# Patient Record
Sex: Female | Born: 1955 | Race: Black or African American | Hispanic: No | Smoking: Current every day smoker
Health system: Southern US, Community
[De-identification: ages and names within clinical notes are randomized; demographics above are authoritative.]

## PROBLEM LIST (undated history)

## (undated) DIAGNOSIS — Z98811 Dental restoration status: Secondary | ICD-10-CM

## (undated) DIAGNOSIS — T884XXA Failed or difficult intubation, initial encounter: Secondary | ICD-10-CM

## (undated) DIAGNOSIS — Z972 Presence of dental prosthetic device (complete) (partial): Secondary | ICD-10-CM

## (undated) DIAGNOSIS — F419 Anxiety disorder, unspecified: Secondary | ICD-10-CM

## (undated) DIAGNOSIS — M549 Dorsalgia, unspecified: Secondary | ICD-10-CM

## (undated) DIAGNOSIS — R51 Headache: Secondary | ICD-10-CM

## (undated) DIAGNOSIS — F410 Panic disorder [episodic paroxysmal anxiety] without agoraphobia: Secondary | ICD-10-CM

## (undated) DIAGNOSIS — F32A Depression, unspecified: Secondary | ICD-10-CM

## (undated) DIAGNOSIS — K219 Gastro-esophageal reflux disease without esophagitis: Secondary | ICD-10-CM

## (undated) DIAGNOSIS — G4730 Sleep apnea, unspecified: Secondary | ICD-10-CM

## (undated) DIAGNOSIS — R011 Cardiac murmur, unspecified: Secondary | ICD-10-CM

## (undated) DIAGNOSIS — F329 Major depressive disorder, single episode, unspecified: Secondary | ICD-10-CM

## (undated) DIAGNOSIS — R0602 Shortness of breath: Secondary | ICD-10-CM

## (undated) DIAGNOSIS — R42 Dizziness and giddiness: Secondary | ICD-10-CM

## (undated) DIAGNOSIS — Z974 Presence of external hearing-aid: Secondary | ICD-10-CM

## (undated) DIAGNOSIS — M542 Cervicalgia: Secondary | ICD-10-CM

## (undated) DIAGNOSIS — I10 Essential (primary) hypertension: Secondary | ICD-10-CM

## (undated) HISTORY — PX: PARTIAL HYSTERECTOMY: SHX80

## (undated) HISTORY — DX: Dorsalgia, unspecified: M54.9

## (undated) HISTORY — PX: ABDOMINAL HYSTERECTOMY: SHX81

---
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
## 2010-01-29 ENCOUNTER — Emergency Department: Payer: Self-pay | Admitting: Emergency Medicine

## 2010-04-24 ENCOUNTER — Ambulatory Visit: Payer: Self-pay | Admitting: Family Medicine

## 2010-06-04 ENCOUNTER — Ambulatory Visit: Payer: Self-pay | Admitting: Family Medicine

## 2010-07-14 ENCOUNTER — Ambulatory Visit: Payer: Self-pay | Admitting: Emergency Medicine

## 2010-10-06 ENCOUNTER — Ambulatory Visit: Payer: Self-pay | Admitting: Psychology

## 2011-01-13 ENCOUNTER — Ambulatory Visit: Payer: Self-pay | Admitting: Family Medicine

## 2011-01-24 ENCOUNTER — Ambulatory Visit: Payer: Self-pay | Admitting: Orthopedic Surgery

## 2011-01-28 ENCOUNTER — Ambulatory Visit: Payer: Self-pay | Admitting: Family Medicine

## 2011-02-04 ENCOUNTER — Ambulatory Visit: Payer: Self-pay | Admitting: Family Medicine

## 2011-05-10 ENCOUNTER — Other Ambulatory Visit (HOSPITAL_COMMUNITY): Payer: Self-pay

## 2011-06-18 ENCOUNTER — Encounter (HOSPITAL_COMMUNITY): Payer: Self-pay | Admitting: Pharmacy Technician

## 2011-06-28 ENCOUNTER — Encounter (HOSPITAL_COMMUNITY): Payer: Self-pay

## 2011-06-28 ENCOUNTER — Encounter (HOSPITAL_COMMUNITY)
Admission: RE | Admit: 2011-06-28 | Discharge: 2011-06-28 | Disposition: A | Discharge status: Home / Self Care | Payer: Medicare Other | Source: Ambulatory Visit | Attending: Neurosurgery | Admitting: Neurosurgery

## 2011-06-28 ENCOUNTER — Inpatient Hospital Stay (HOSPITAL_COMMUNITY): Admission: RE | Admit: 2011-06-28 | Payer: PRIVATE HEALTH INSURANCE | Source: Ambulatory Visit

## 2011-06-28 ENCOUNTER — Other Ambulatory Visit: Payer: Self-pay

## 2011-06-28 ENCOUNTER — Encounter (HOSPITAL_COMMUNITY)
Admission: RE | Admit: 2011-06-28 | Discharge: 2011-06-28 | Disposition: A | Discharge status: Home / Self Care | Payer: Medicare Other | Source: Ambulatory Visit | Attending: Anesthesiology | Admitting: Anesthesiology

## 2011-06-28 HISTORY — DX: Shortness of breath: R06.02

## 2011-06-28 HISTORY — DX: Sleep apnea, unspecified: G47.30

## 2011-06-28 HISTORY — DX: Headache: R51

## 2011-06-28 HISTORY — DX: Anxiety disorder, unspecified: F41.9

## 2011-06-28 HISTORY — DX: Depression, unspecified: F32.A

## 2011-06-28 HISTORY — DX: Major depressive disorder, single episode, unspecified: F32.9

## 2011-06-28 HISTORY — DX: Panic disorder (episodic paroxysmal anxiety): F41.0

## 2011-06-28 HISTORY — DX: Dizziness and giddiness: R42

## 2011-06-28 LAB — DIFFERENTIAL
Basophils Absolute: 0 10*3/uL (ref 0.0–0.1)
Basophils Relative: 0 % (ref 0–1)
Eosinophils Absolute: 0.1 10*3/uL (ref 0.0–0.7)
Eosinophils Relative: 1 % (ref 0–5)
Lymphocytes Relative: 31 % (ref 12–46)
Lymphs Abs: 3.1 10*3/uL (ref 0.7–4.0)
Monocytes Absolute: 0.4 10*3/uL (ref 0.1–1.0)
Monocytes Relative: 5 % (ref 3–12)
Neutro Abs: 6.3 10*3/uL (ref 1.7–7.7)
Neutrophils Relative %: 63 % (ref 43–77)

## 2011-06-28 LAB — CBC
HCT: 41.8 % (ref 36.0–46.0)
Hemoglobin: 14.3 g/dL (ref 12.0–15.0)
MCH: 30.2 pg (ref 26.0–34.0)
MCHC: 34.2 g/dL (ref 30.0–36.0)
MCV: 88.4 fL (ref 78.0–100.0)
Platelets: 230 10*3/uL (ref 150–400)
RBC: 4.73 MIL/uL (ref 3.87–5.11)
RDW: 14.4 % (ref 11.5–15.5)
WBC: 9.9 10*3/uL (ref 4.0–10.5)

## 2011-06-28 LAB — SURGICAL PCR SCREEN
MRSA, PCR: NEGATIVE
Staphylococcus aureus: NEGATIVE

## 2011-06-28 NOTE — Progress Notes (Signed)
Left message with Erie Noe at Dr. Lindalou Hose office notifying him of potential blood refusal and pt's concern regarding consent.

## 2011-06-28 NOTE — Progress Notes (Signed)
During PAT visit pt reports that she is thought that she was only having C 5,6,7 operated on. Pt also had additional questions that she needed answered before she felt comfortable signing consent form. I informed pt that she could talk to Dr. Jordan Likes that morning. Pt reports that she is a new Jehov[employer] witness and is unsure if she wants to refuse blood or not. Pt initially stated she would give permission but was unsure as to what she felt was right. I told pt she could let us know the day of surgery if she want to consent or refuse blood and that we would ensure that she was cared for either way and honor her wishes either way.

## 2011-06-28 NOTE — Pre-Procedure Instructions (Signed)
20 Janice Wilson  06/28/2011   Your procedure is scheduled on:  July 05, 2011  Report to [facility] at 0530 AM.  Call this number if you have problems the morning of surgery: [phone]   Remember:   Do not eat food:After Midnight.  May have clear liquids: up to 4 Hours before arrival.  Clear liquids include soda, tea, black coffee, apple or grape juice, broth.  Take these medicines the morning of surgery with A SIP OF WATER:  Tylenol, albuterol, flonase, keflex, ativan, eye drops   Do not wear jewelry, make-up or nail polish.  Do not wear lotions, powders, or perfumes. You may wear deodorant.  Do not shave 48 hours prior to surgery.  Do not bring valuables to the hospital.  Contacts, dentures or bridgework may not be worn into surgery.  Leave suitcase in the car. After surgery it may be brought to your room.  For patients admitted to the hospital, checkout time is 11:00 AM the day of discharge.   Patients discharged the day of surgery will not be allowed to drive home.  Special Instructions: CHG Shower Use Special Wash: 1/2 bottle night before surgery and 1/2 bottle morning of surgery.   Please read over the following fact sheets that you were given: Pain Booklet, Coughing and Deep Breathing, Blood Transfusion Information and Surgical Site Infection Prevention

## 2011-07-01 ENCOUNTER — Other Ambulatory Visit (HOSPITAL_COMMUNITY): Payer: PRIVATE HEALTH INSURANCE

## 2011-07-04 MED ORDER — CHLORHEXIDINE GLUCONATE 4 % EX LIQD: 1.0000 "application " | Freq: Once | CUTANEOUS | Status: DC

## 2011-07-04 MED ORDER — CEFAZOLIN SODIUM 1-5 GM-% IV SOLN
1.0000 g | INTRAVENOUS | Status: AC
Start: 2011-07-05 — End: 2011-07-05
  Administered 2011-07-05: 1 g via INTRAVENOUS
  Filled 2011-07-04: qty 50

## 2011-07-04 MED ORDER — CEFAZOLIN SODIUM 1-5 GM-% IV SOLN: 1.0000 g | INTRAVENOUS | Status: DC

## 2011-07-04 MED ORDER — DEXAMETHASONE SODIUM PHOSPHATE 10 MG/ML IJ SOLN
10.0000 mg | Freq: Once | INTRAMUSCULAR | Status: DC
  Filled 2011-07-04: qty 1

## 2011-07-05 ENCOUNTER — Inpatient Hospital Stay (HOSPITAL_COMMUNITY): Payer: Medicare Other | Admitting: *Deleted

## 2011-07-05 ENCOUNTER — Inpatient Hospital Stay (HOSPITAL_COMMUNITY): Payer: Medicare Other

## 2011-07-05 ENCOUNTER — Inpatient Hospital Stay (HOSPITAL_COMMUNITY)
Admission: RE | Admit: 2011-07-05 | Discharge: 2011-07-06 | DRG: 460 | Disposition: A | Discharge status: Home / Self Care | Payer: Medicare Other | Source: Ambulatory Visit | Attending: Neurosurgery | Admitting: Neurosurgery

## 2011-07-05 ENCOUNTER — Encounter (HOSPITAL_COMMUNITY)
Admission: RE | Disposition: A | Discharge status: Home / Self Care | Payer: Self-pay | Source: Ambulatory Visit | Attending: Neurosurgery

## 2011-07-05 ENCOUNTER — Encounter (HOSPITAL_COMMUNITY): Payer: Self-pay | Admitting: *Deleted

## 2011-07-05 DIAGNOSIS — F3289 Other specified depressive episodes: Secondary | ICD-10-CM | POA: Diagnosis present

## 2011-07-05 DIAGNOSIS — Z01812 Encounter for preprocedural laboratory examination: Secondary | ICD-10-CM

## 2011-07-05 DIAGNOSIS — F410 Panic disorder [episodic paroxysmal anxiety] without agoraphobia: Secondary | ICD-10-CM | POA: Diagnosis present

## 2011-07-05 DIAGNOSIS — H409 Unspecified glaucoma: Secondary | ICD-10-CM | POA: Diagnosis present

## 2011-07-05 DIAGNOSIS — Z886 Allergy status to analgesic agent status: Secondary | ICD-10-CM

## 2011-07-05 DIAGNOSIS — M4802 Spinal stenosis, cervical region: Secondary | ICD-10-CM | POA: Diagnosis present

## 2011-07-05 DIAGNOSIS — F17200 Nicotine dependence, unspecified, uncomplicated: Secondary | ICD-10-CM | POA: Diagnosis present

## 2011-07-05 DIAGNOSIS — J45909 Unspecified asthma, uncomplicated: Secondary | ICD-10-CM | POA: Diagnosis present

## 2011-07-05 DIAGNOSIS — G4730 Sleep apnea, unspecified: Secondary | ICD-10-CM | POA: Diagnosis present

## 2011-07-05 DIAGNOSIS — Z79899 Other long term (current) drug therapy: Secondary | ICD-10-CM

## 2011-07-05 DIAGNOSIS — F329 Major depressive disorder, single episode, unspecified: Secondary | ICD-10-CM | POA: Diagnosis present

## 2011-07-05 DIAGNOSIS — F411 Generalized anxiety disorder: Secondary | ICD-10-CM | POA: Diagnosis present

## 2011-07-05 DIAGNOSIS — M4712 Other spondylosis with myelopathy, cervical region: Principal | ICD-10-CM | POA: Diagnosis present

## 2011-07-05 HISTORY — PX: ANTERIOR CERVICAL DECOMP/DISCECTOMY FUSION: SHX1161

## 2011-07-05 SURGERY — ANTERIOR CERVICAL DECOMPRESSION/DISCECTOMY FUSION 3 LEVELS
Anesthesia: General | Site: Spine Cervical | Wound class: Clean

## 2011-07-05 MED ORDER — OXYCODONE-ACETAMINOPHEN 5-325 MG PO TABS: 1.0000 | ORAL_TABLET | ORAL | Status: DC | PRN

## 2011-07-05 MED ORDER — CEFAZOLIN SODIUM 1-5 GM-% IV SOLN
1.0000 g | Freq: Three times a day (TID) | INTRAVENOUS | Status: AC
  Administered 2011-07-05 – 2011-07-06 (×2): 1 g via INTRAVENOUS
  Filled 2011-07-05 (×2): qty 50

## 2011-07-05 MED ORDER — LACTATED RINGERS IV SOLN
INTRAVENOUS | Status: DC | PRN
  Administered 2011-07-05 (×2): via INTRAVENOUS

## 2011-07-05 MED ORDER — SUFENTANIL CITRATE 50 MCG/ML IV SOLN
INTRAVENOUS | Status: DC | PRN
  Administered 2011-07-05 (×3): 10 ug via INTRAVENOUS

## 2011-07-05 MED ORDER — FLUTICASONE PROPIONATE 50 MCG/ACT NA SUSP
2.0000 | Freq: Every day | NASAL | Status: DC | PRN
  Administered 2011-07-06: 2 via NASAL
  Filled 2011-07-05: qty 16

## 2011-07-05 MED ORDER — ZOLPIDEM TARTRATE 5 MG PO TABS: 5.0000 mg | ORAL_TABLET | Freq: Every evening | ORAL | Status: DC | PRN

## 2011-07-05 MED ORDER — SODIUM CHLORIDE 0.9 % IR SOLN
Status: DC | PRN
  Administered 2011-07-05: 11:00:00

## 2011-07-05 MED ORDER — THROMBIN 5000 UNITS EX SOLR
OROMUCOSAL | Status: DC | PRN
  Administered 2011-07-05: 12:00:00 via TOPICAL

## 2011-07-05 MED ORDER — ACETAMINOPHEN 650 MG RE SUPP: 650.0000 mg | RECTAL | Status: DC | PRN

## 2011-07-05 MED ORDER — TIMOLOL HEMIHYDRATE 0.5 % OP SOLN: 1.0000 [drp] | OPHTHALMIC | Status: DC

## 2011-07-05 MED ORDER — HYDROXYZINE HCL 50 MG/ML IM SOLN: 50.0000 mg | INTRAMUSCULAR | Status: DC | PRN

## 2011-07-05 MED ORDER — CYCLOBENZAPRINE HCL 10 MG PO TABS: 10.0000 mg | ORAL_TABLET | Freq: Three times a day (TID) | ORAL | Status: DC | PRN

## 2011-07-05 MED ORDER — BACITRACIN 50000 UNITS IM SOLR
INTRAMUSCULAR | Status: AC
  Filled 2011-07-05: qty 1

## 2011-07-05 MED ORDER — ONDANSETRON HCL 4 MG/2ML IJ SOLN
INTRAMUSCULAR | Status: DC | PRN
  Administered 2011-07-05: 4 mg via INTRAVENOUS

## 2011-07-05 MED ORDER — HYDROXYZINE HCL 25 MG PO TABS: 50.0000 mg | ORAL_TABLET | ORAL | Status: DC | PRN

## 2011-07-05 MED ORDER — DEXAMETHASONE SODIUM PHOSPHATE 10 MG/ML IJ SOLN
INTRAMUSCULAR | Status: DC | PRN
  Administered 2011-07-05: 10 mg via INTRAVENOUS

## 2011-07-05 MED ORDER — SULFAMETHOXAZOLE-TMP DS 800-160 MG PO TABS
1.0000 | ORAL_TABLET | Freq: Two times a day (BID) | ORAL | Status: DC
  Administered 2011-07-05: 1 via ORAL
  Filled 2011-07-05 (×3): qty 1

## 2011-07-05 MED ORDER — ALBUTEROL SULFATE HFA 108 (90 BASE) MCG/ACT IN AERS
1.0000 | INHALATION_SPRAY | Freq: Two times a day (BID) | RESPIRATORY_TRACT | Status: DC | PRN
  Filled 2011-07-05: qty 6.7

## 2011-07-05 MED ORDER — 0.9 % SODIUM CHLORIDE (POUR BTL) OPTIME
TOPICAL | Status: DC | PRN
  Administered 2011-07-05: 1000 mL

## 2011-07-05 MED ORDER — MEPERIDINE HCL 25 MG/ML IJ SOLN: 6.2500 mg | INTRAMUSCULAR | Status: DC | PRN

## 2011-07-05 MED ORDER — PROMETHAZINE HCL 25 MG/ML IJ SOLN: 6.2500 mg | INTRAMUSCULAR | Status: DC | PRN

## 2011-07-05 MED ORDER — ROCURONIUM BROMIDE 100 MG/10ML IV SOLN
INTRAVENOUS | Status: DC | PRN
  Administered 2011-07-05: 50 mg via INTRAVENOUS

## 2011-07-05 MED ORDER — THROMBIN 20000 UNITS EX KIT
PACK | CUTANEOUS | Status: DC | PRN
  Administered 2011-07-05: 20000 [IU] via TOPICAL

## 2011-07-05 MED ORDER — MIDAZOLAM HCL 5 MG/5ML IJ SOLN
INTRAMUSCULAR | Status: DC | PRN
  Administered 2011-07-05: 2 mg via INTRAVENOUS

## 2011-07-05 MED ORDER — DOCUSATE SODIUM 100 MG PO CAPS
100.0000 mg | ORAL_CAPSULE | Freq: Two times a day (BID) | ORAL | Status: DC
  Administered 2011-07-05: 100 mg via ORAL
  Filled 2011-07-05: qty 1

## 2011-07-05 MED ORDER — FENTANYL CITRATE 0.05 MG/ML IJ SOLN
25.0000 ug | INTRAMUSCULAR | Status: DC | PRN
  Administered 2011-07-05 (×3): 25 ug via INTRAVENOUS

## 2011-07-05 MED ORDER — PHENOL 1.4 % MT LIQD
1.0000 | OROMUCOSAL | Status: DC | PRN
  Administered 2011-07-05: 1 via OROMUCOSAL
  Filled 2011-07-05: qty 177

## 2011-07-05 MED ORDER — HYDROCODONE-ACETAMINOPHEN 5-325 MG PO TABS
1.0000 | ORAL_TABLET | ORAL | Status: DC | PRN
  Administered 2011-07-05: 1 via ORAL
  Administered 2011-07-06: 2 via ORAL
  Administered 2011-07-06: 1 via ORAL
  Filled 2011-07-05 (×2): qty 2
  Filled 2011-07-05: qty 1

## 2011-07-05 MED ORDER — SODIUM CHLORIDE 0.9 % IJ SOLN: 3.0000 mL | Freq: Two times a day (BID) | INTRAMUSCULAR | Status: DC

## 2011-07-05 MED ORDER — SODIUM CHLORIDE 0.9 % IJ SOLN: 3.0000 mL | INTRAMUSCULAR | Status: DC | PRN

## 2011-07-05 MED ORDER — HEMOSTATIC AGENTS (NO CHARGE) OPTIME
TOPICAL | Status: DC | PRN
  Administered 2011-07-05: 1 via TOPICAL

## 2011-07-05 MED ORDER — MECLIZINE HCL 25 MG PO TABS
25.0000 mg | ORAL_TABLET | Freq: Three times a day (TID) | ORAL | Status: DC | PRN
  Filled 2011-07-05: qty 1

## 2011-07-05 MED ORDER — MENTHOL 3 MG MT LOZG: 1.0000 | LOZENGE | OROMUCOSAL | Status: DC | PRN

## 2011-07-05 MED ORDER — FENTANYL CITRATE 0.05 MG/ML IJ SOLN
INTRAMUSCULAR | Status: AC
  Filled 2011-07-05: qty 2

## 2011-07-05 MED ORDER — PROPOFOL 10 MG/ML IV BOLUS
INTRAVENOUS | Status: DC | PRN
  Administered 2011-07-05: 200 mg via INTRAVENOUS

## 2011-07-05 MED ORDER — TIMOLOL MALEATE 0.5 % OP SOLN
1.0000 [drp] | Freq: Every day | OPHTHALMIC | Status: DC
  Filled 2011-07-05: qty 5

## 2011-07-05 MED ORDER — SODIUM CHLORIDE 0.9 % IV SOLN
INTRAVENOUS | Status: AC
  Filled 2011-07-05: qty 500

## 2011-07-05 MED ORDER — LATANOPROST 0.005 % OP SOLN
1.0000 [drp] | Freq: Every day | OPHTHALMIC | Status: DC
  Administered 2011-07-05: 1 [drp] via OPHTHALMIC
  Filled 2011-07-05: qty 2.5

## 2011-07-05 MED ORDER — FLUTICASONE-SALMETEROL 250-50 MCG/DOSE IN AEPB
1.0000 | INHALATION_SPRAY | Freq: Two times a day (BID) | RESPIRATORY_TRACT | Status: DC | PRN
  Filled 2011-07-05: qty 14

## 2011-07-05 MED ORDER — LORAZEPAM 0.5 MG PO TABS
0.5000 mg | ORAL_TABLET | Freq: Every day | ORAL | Status: DC
Start: 2011-07-06 — End: 2011-07-06

## 2011-07-05 MED ORDER — HYDROMORPHONE HCL PF 1 MG/ML IJ SOLN
0.5000 mg | INTRAMUSCULAR | Status: DC | PRN
  Administered 2011-07-05 (×2): 1 mg via INTRAVENOUS
  Filled 2011-07-05 (×2): qty 1

## 2011-07-05 MED ORDER — NEOSTIGMINE METHYLSULFATE 1 MG/ML IJ SOLN
INTRAMUSCULAR | Status: DC | PRN
  Administered 2011-07-05: 5 mg via INTRAVENOUS

## 2011-07-05 MED ORDER — LACTATED RINGERS IV SOLN: INTRAVENOUS | Status: DC

## 2011-07-05 MED ORDER — SENNA 8.6 MG PO TABS
1.0000 | ORAL_TABLET | Freq: Two times a day (BID) | ORAL | Status: DC
  Administered 2011-07-05: 8.6 mg via ORAL
  Filled 2011-07-05 (×3): qty 1

## 2011-07-05 MED ORDER — GLYCOPYRROLATE 0.2 MG/ML IJ SOLN
INTRAMUSCULAR | Status: DC | PRN
  Administered 2011-07-05: .6 mg via INTRAVENOUS

## 2011-07-05 MED ORDER — CEPHALEXIN 500 MG PO CAPS
500.0000 mg | ORAL_CAPSULE | Freq: Two times a day (BID) | ORAL | Status: DC
  Filled 2011-07-05: qty 1

## 2011-07-05 MED ORDER — ACETAMINOPHEN 325 MG PO TABS: 650.0000 mg | ORAL_TABLET | ORAL | Status: DC | PRN

## 2011-07-05 SURGICAL SUPPLY — 57 items
BAG DECANTER FOR FLEXI CONT (MISCELLANEOUS) ×2 IMPLANT
BENZOIN TINCTURE PRP APPL 2/3 (GAUZE/BANDAGES/DRESSINGS) IMPLANT
BRUSH SCRUB EZ PLAIN DRY (MISCELLANEOUS) ×2 IMPLANT
BUR MATCHSTICK NEURO 3.0 LAGG (BURR) ×2 IMPLANT
CANISTER SUCTION 2500CC (MISCELLANEOUS) ×2 IMPLANT
CLOTH BEACON ORANGE TIMEOUT ST (SAFETY) ×2 IMPLANT
CONT SPEC 4OZ CLIKSEAL STRL BL (MISCELLANEOUS) ×2 IMPLANT
DRAPE C-ARM 42X72 X-RAY (DRAPES) ×4 IMPLANT
DRAPE LAPAROTOMY 100X72 PEDS (DRAPES) ×2 IMPLANT
DRAPE MICROSCOPE ZEISS OPMI (DRAPES) ×2 IMPLANT
DRAPE POUCH INSTRU U-SHP 10X18 (DRAPES) ×2 IMPLANT
DRILL BIT (BIT) ×2 IMPLANT
ELECT COATED BLADE 2.86 ST (ELECTRODE) ×2 IMPLANT
ELECT REM PT RETURN 9FT ADLT (ELECTROSURGICAL) ×2
ELECTRODE REM PT RTRN 9FT ADLT (ELECTROSURGICAL) ×1 IMPLANT
GAUZE SPONGE 4X4 16PLY XRAY LF (GAUZE/BANDAGES/DRESSINGS) IMPLANT
GLOVE BIOGEL PI IND STRL 7.5 (GLOVE) ×1 IMPLANT
GLOVE BIOGEL PI IND STRL 8 (GLOVE) ×1 IMPLANT
GLOVE BIOGEL PI INDICATOR 7.5 (GLOVE) ×1
GLOVE BIOGEL PI INDICATOR 8 (GLOVE) ×1
GLOVE ECLIPSE 7.5 STRL STRAW (GLOVE) ×4 IMPLANT
GLOVE ECLIPSE 8.5 STRL (GLOVE) ×2 IMPLANT
GLOVE EXAM NITRILE LRG STRL (GLOVE) IMPLANT
GLOVE EXAM NITRILE MD LF STRL (GLOVE) ×2 IMPLANT
GLOVE EXAM NITRILE XL STR (GLOVE) IMPLANT
GLOVE EXAM NITRILE XS STR PU (GLOVE) IMPLANT
GLOVE INDICATOR 8.5 STRL (GLOVE) ×2 IMPLANT
GLOVE SURG SS PI 7.0 STRL IVOR (GLOVE) ×4 IMPLANT
GOWN BRE IMP SLV AUR LG STRL (GOWN DISPOSABLE) IMPLANT
GOWN BRE IMP SLV AUR XL STRL (GOWN DISPOSABLE) ×10 IMPLANT
GOWN STRL REIN 2XL LVL4 (GOWN DISPOSABLE) IMPLANT
HEAD HALTER (SOFTGOODS) ×2 IMPLANT
HEMOSTAT POWDER SURGIFOAM 1G (HEMOSTASIS) ×2 IMPLANT
KIT BASIN OR (CUSTOM PROCEDURE TRAY) ×2 IMPLANT
KIT ROOM TURNOVER OR (KITS) ×2 IMPLANT
NEEDLE SPNL 20GX3.5 QUINCKE YW (NEEDLE) ×2 IMPLANT
NS IRRIG 1000ML POUR BTL (IV SOLUTION) ×2 IMPLANT
PACK LAMINECTOMY NEURO (CUSTOM PROCEDURE TRAY) ×2 IMPLANT
PAD ARMBOARD 7.5X6 YLW CONV (MISCELLANEOUS) ×6 IMPLANT
PLATE 3 55XLCK NS SPNE CVD (Plate) ×1 IMPLANT
PLATE 3 ATLANTIS TRANS (Plate) ×1 IMPLANT
RUBBERBAND STERILE (MISCELLANEOUS) ×4 IMPLANT
SCREW ST FIX 4 ATL 3120213 (Screw) ×16 IMPLANT
SPACER BONE CORNERSTONE 5X14 (Orthopedic Implant) ×2 IMPLANT
SPACER BONE CORNERSTONE 6X14 (Orthopedic Implant) ×4 IMPLANT
SPONGE GAUZE 4X4 12PLY (GAUZE/BANDAGES/DRESSINGS) IMPLANT
SPONGE INTESTINAL PEANUT (DISPOSABLE) ×2 IMPLANT
SPONGE SURGIFOAM ABS GEL 100 (HEMOSTASIS) ×2 IMPLANT
STRIP CLOSURE SKIN 1/2X4 (GAUZE/BANDAGES/DRESSINGS) IMPLANT
SUT PDS AB 5-0 P3 18 (SUTURE) ×2 IMPLANT
SUT VIC AB 3-0 SH 8-18 (SUTURE) ×2 IMPLANT
SYR 20ML ECCENTRIC (SYRINGE) ×2 IMPLANT
TAPE CLOTH 4X10 WHT NS (GAUZE/BANDAGES/DRESSINGS) IMPLANT
TAPE CLOTH SURG 4X10 WHT LF (GAUZE/BANDAGES/DRESSINGS) ×2 IMPLANT
TOWEL OR 17X24 6PK STRL BLUE (TOWEL DISPOSABLE) ×2 IMPLANT
TOWEL OR 17X26 10 PK STRL BLUE (TOWEL DISPOSABLE) ×2 IMPLANT
WATER STERILE IRR 1000ML POUR (IV SOLUTION) ×2 IMPLANT

## 2011-07-05 NOTE — Anesthesia Postprocedure Evaluation (Signed)
  Anesthesia Post-op Note  Patient: Janice Wilson  Procedure(s) Performed:  ANTERIOR CERVICAL DECOMPRESSION/DISCECTOMY FUSION 3 LEVELS - Cervical Four-Five, Cervical Five-Six, Cervical Six-Seven Anterior Cervical Decompression Fusion WITH ALLOGRAFT AND PLATING  Patient Location: PACU  Anesthesia Type: General  Level of Consciousness: awake  Airway and Oxygen Therapy: Patient Spontanous Breathing  Post-op Pain: mild  Post-op Assessment: Post-op Vital signs reviewed  Post-op Vital Signs: stable  Complications: No apparent anesthesia complications

## 2011-07-05 NOTE — Op Note (Signed)
Date of procedure: 07/05/2011  Date of dictation: Same  Service: Neurosurgery  Preoperative diagnosis: Cervical stenosis with myelopathy.  Postoperative diagnosis: Same  Procedure Name: C4-5, C5-6, C6-7 anterior cervical discectomy and fusion with allograft and anterior plating.  Surgeon:Jessee Mezera A.Georg Ang, M.D.  Asst. Surgeon: Wynetta Emery  Anesthesia: General  Indication: Patient is a 56 year old female with neck pain and bilateral upper chamois numbness paresthesias and weakness consistent with an early cervical myelopathy. Workup demonstrates evidence of marked multilevel stenosis worse at the C5-6 level. Patient presents now for three-level anterior decompression infusion in hopes of improving her symptoms.  Operative note: Patient taken to the operative room and placed on the operative table in the supine position. After an adequate level of anesthesia is achieved, the patient positioned supine with her neck slightly extended and held in place with halter traction. Patient's anterior cervical region was prepped and draped sterilely. 10 blade used to make a linear skin incision overlying the C5-6 vertebral level. This is carried down sharply to the platysma. This was then divided vertically and dissection proceeded along the medial border of the sternocleidomastoid muscle and carotid sheath. Trachea and esophagus were mobilized and retracted towards the left. Prevertebral fascia was stripped off the anterior spinal column. Longus colli muscles and elevated bilaterally using a left heart. Deep self retaining retractors placed intraoperative fluoroscopy is used and the levels were confirmed. The spaces at all 3 levels and incised with 15 blade in a rectangular fashion. A wide disc space cleanout was then performed using pituitary rongeurs Kerrison rongeurs forward and backward angle Carlen curettes and the high-speed drill. All meds of the disc were removed down to the level of the posterior annulus.  Microscope was then brought field used throughout the remainder of the discectomy. Remaining aspects of annulus and osteophytes removed using a high-speed drill. Posterior lateral is elevated and resected in piecemeal fashion. Starting first at C4-5 the bodies of C4 and C5 were then undercut using Kerrison rongeurs to fully decompress the central canal. Decompression then proceeded to C8 out each neural foramen. Wide anterior foraminotomies were then form of course exiting C5 nerve roots bilaterally. At this point a very thorough decompression achieved. There is no his injury to thecal sac and nerve roots. Wounds that area irrigated. Discectomies were then performed at C5-6 and C6-7 in a similar fashion again without complication. Gelfoam was placed topically for hemostasis. Cornerstone allograft wedges at were then impacted in place and recessed roughly 1 mm from the intervertebral margin at C4-5, C5-6, and C6-7. A 55 mm Atlantis translational plate was then placed over the C4-C7 levels. This is an attachment or fluoroscopic guidance using 13 mm fixed angle screws 2 each all levels. All 8 screws ago final tightening down to be solidly within bone. Locking screws were gauge at all 4 levels. Final images revealed good position the bone graft and hardware proper upper level with normal lamina spine. Wound is then irrigated one final time. Hemostasis was assured with bipolar cautery. Wounds and closed in a typical fashion. Steri-Strips triggers were applied. There no pertinent gauge progression well and she returns to the recovery postop.

## 2011-07-05 NOTE — Anesthesia Preprocedure Evaluation (Addendum)
Anesthesia Evaluation  Patient identified by MRN, date of birth, ID band Patient awake    Reviewed: Allergy & Precautions, H&P , NPO status , Patient's Chart, lab work & pertinent test results, reviewed documented beta blocker date and time   Airway Mallampati: II TM Distance: >3 FB Neck ROM: Full    Dental  (+) Teeth Intact and Dental Advisory Given   Pulmonary shortness of breath and with exertion, asthma , sleep apnea and Continuous Positive Airway Pressure Ventilation , Current Smoker,  clear to auscultation        Cardiovascular neg cardio ROS Regular Normal    Neuro/Psych    GI/Hepatic negative GI ROS,   Endo/Other  Negative Endocrine ROS  Renal/GU negative Renal ROS     Musculoskeletal   Abdominal   Peds  Hematology  (+) JEHOVAH'S WITNESSPatient resigned refusal for blood paper work and aborted it. In a life emergency patient would accept RBCs/blood transfusion. CRNA RM present in discussion with patient as well as Dr. Jordan Likes awareness of discussion. CE   Anesthesia Other Findings   Reproductive/Obstetrics                        Anesthesia Physical Anesthesia Plan  ASA: III  Anesthesia Plan: General   Post-op Pain Management:    Induction: Intravenous  Airway Management Planned: Oral ETT  Additional Equipment:   Intra-op Plan:   Post-operative Plan: Extubation in OR  Informed Consent:   Plan Discussed with: CRNA  Anesthesia Plan Comments:         Anesthesia Quick Evaluation

## 2011-07-05 NOTE — Plan of Care (Signed)
Problem: Consults Goal: Diagnosis - Spinal Surgery Outcome: Completed/Met Date Met:  07/05/11 Cervical Spine Fusion

## 2011-07-05 NOTE — Progress Notes (Signed)
2305 :RT NOTE;Pt setup on CPAP via A-flex mode(4-20cmH20),with nasal mask for hs.

## 2011-07-05 NOTE — Transfer of Care (Signed)
Immediate Anesthesia Transfer of Care Note  Patient: Janice Wilson  Procedure(s) Performed:  ANTERIOR CERVICAL DECOMPRESSION/DISCECTOMY FUSION 3 LEVELS - Cervical Four-Five, Cervical Five-Six, Cervical Six-Seven Anterior Cervical Decompression Fusion WITH ALLOGRAFT AND PLATING  Patient Location: PACU  Anesthesia Type: General  Level of Consciousness: awake and alert   Airway & Oxygen Therapy: Patient Spontanous Breathing and Patient connected to face mask oxygen  Post-op Assessment: Report given to PACU RN, Post -op Vital signs reviewed and stable and Patient moving all extremities  Post vital signs: Reviewed and stable Filed Vitals:   07/05/11 0841  BP: 137/86  Pulse: 53  Temp: 36.8 C  Resp: 18    Complications: No apparent anesthesia complications

## 2011-07-05 NOTE — Brief Op Note (Signed)
07/05/2011  12:26 PM  PATIENT:  Janice Wilson  56 y.o. female  PRE-OPERATIVE DIAGNOSIS:  STENOSIS  POST-OPERATIVE DIAGNOSIS:  STENOSIS  PROCEDURE:  Procedure(s): ANTERIOR CERVICAL DECOMPRESSION/DISCECTOMY FUSION 3 LEVELS  SURGEON:  Surgeon(s): Sherilyn Cooter A Marcellina Jonsson Mariam Dollar  PHYSICIAN ASSISTANT:   ASSISTANTSWynetta Emery   ANESTHESIA:   general  EBL:  Total I/O In: 1000 [I.V.:1000] Out: -   BLOOD ADMINISTERED:none  DRAINS: none   LOCAL MEDICATIONS USED:  NONE  SPECIMEN:  No Specimen  DISPOSITION OF SPECIMEN:  N/A  COUNTS:  YES  TOURNIQUET:  * No tourniquets in log *  DICTATION: .Dragon Dictation  PLAN OF CARE: Admit to inpatient   PATIENT DISPOSITION:  PACU - hemodynamically stable.   Delay start of Pharmacological VTE agent (>24hrs) due to surgical blood loss or risk of bleeding:  {yes

## 2011-07-05 NOTE — Preoperative (Signed)
Beta Blockers   Reason not to administer Beta Blockers:Not Applicable 

## 2011-07-05 NOTE — H&P (Signed)
Janice Wilson is an 56 y.o. female.   Chief Complaint: Neck pain HPI: 56 year old woman with neck pain with radiation to both upper extremities. Workup demonstrates evidence of severe multilevel stenosis worse at C5-6 with a central disc herniation and severe spinal cord compression. Patient presents now for C4-5 C5-6 and C6-7 decompression and fusion surgery. Patient has some symptoms of mild weakness in her distal upper trimming his. She has some mild gait disturbance. She has no bowel or bladder dysfunction.  Past Medical History  Diagnosis Date  . Asthma   . Shortness of breath   . Sleep apnea     uses CPAP machine sleep study 9/ 2012 [facility] [place]  . Anxiety   . Headache   . Glaucoma   . Vertigo   . Panic attack   . Depression     Past Surgical History  Procedure Date  . Partial hysterectomy   . Cesarean section     Family History  Problem Relation Age of Onset  . Anesthesia problems Neg Hx    Social History:  reports that she has been smoking.  She has never used smokeless tobacco. She reports that she drinks alcohol. She reports that she does not use illicit drugs.  Allergies:  Allergies  Allergen Reactions  . Oxycontin Other (See Comments)    REACTION:"MAKES ME PALE"    Medications Prior to Admission  Medication Dose Route Frequency Provider Last Rate Last Dose  . bacitracin 16109 UNITS injection           . ceFAZolin (ANCEF) IVPB 1 g/50 mL premix  1 g Intravenous 60 min Pre-Op Khamryn Calderone A Mailey Landstrom      . chlorhexidine (HIBICLENS) 4 % liquid 1 application  1 application Topical Once [employer]      . chlorhexidine (HIBICLENS) 4 % liquid 1 application  1 application Topical Once [employer]      . dexamethasone (DECADRON) injection 10 mg  10 mg Intravenous Once [employer]      . fentaNYL (SUBLIMAZE) injection 25-50 mcg  25-50 mcg Intravenous Q5 min PRN Judie Petit, MD      . lactated ringers infusion   Intravenous Continuous Judie Petit,  MD      . meperidine (DEMEROL) injection 6.25-12.5 mg  6.25-12.5 mg Intravenous Q5 min PRN Judie Petit, MD      . promethazine (PHENERGAN) injection 6.25-12.5 mg  6.25-12.5 mg Intravenous Q15 min PRN Judie Petit, MD      . sodium chloride 0.9 % infusion           . DISCONTD: ceFAZolin (ANCEF) IVPB 1 g/50 mL premix  1 g Intravenous 60 min Pre-Op Kamalei Roeder A Lakeem Rozo       Medications Prior to Admission  Medication Sig Dispense Refill  . fluticasone (FLONASE) 50 MCG/ACT nasal spray Place 2 sprays into the nose daily as needed. FOR CONGESTION       . Fluticasone-Salmeterol (ADVAIR) 250-50 MCG/DOSE AEPB Inhale 1 puff into the lungs every 12 (twelve) hours as needed. FOR WHEEZING       . ibuprofen (ADVIL,MOTRIN) 800 MG tablet Take 800 mg by mouth daily as needed. FOR PAIN       . LORazepam (ATIVAN) 0.5 MG tablet Take 0.5 mg by mouth daily.          No results found for this or any previous visit (from the past 48 hour(s)). No results found.  Review of Systems  All  other systems reviewed and are negative.    Blood pressure 137/86, pulse 53, temperature 98.3 F (36.8 C), temperature source Oral, resp. rate 18, SpO2 97.00%. Physical Exam  Nursing note and vitals reviewed. Constitutional: She is oriented to person, place, and time. She appears well-developed and well-nourished. No distress.  HENT:  Head: Normocephalic and atraumatic.  Eyes: Pupils are equal, round, and reactive to light. Right eye exhibits no discharge. Left eye exhibits no discharge.  Neck: Normal range of motion. Neck supple. No tracheal deviation present. No thyromegaly present.  Cardiovascular: Normal rate, regular rhythm, normal heart sounds and intact distal pulses.   Respiratory: Effort normal and breath sounds normal. No respiratory distress. She has no wheezes.  GI: Soft. Bowel sounds are normal. She exhibits no distension. There is no tenderness.  Musculoskeletal: Normal range of motion. She exhibits no edema and  no tenderness.  Neurological: She is alert and oriented to person, place, and time. She has normal reflexes. She displays normal reflexes. No cranial nerve deficit. Coordination normal.  Skin: Skin is warm and dry. No rash noted. She is not diaphoretic. No erythema. No pallor.  Psychiatric: She has a normal mood and affect. Her behavior is normal. Judgment and thought content normal.     Assessment/Plan Cervical stenosis with myelopathy. Plan to C4-5 C5-6 and C6-7 anterior cervical discectomy and fusion with allograft and anterior plating. Risks and benefits explained patient wishes to proceed.  Kagan Hietpas A 07/05/2011, 10:01 AM

## 2011-07-06 ENCOUNTER — Encounter (HOSPITAL_COMMUNITY): Payer: Self-pay | Admitting: Neurosurgery

## 2011-07-06 MED ORDER — CYCLOBENZAPRINE HCL 10 MG PO TABS: 10.0000 mg | ORAL_TABLET | Freq: Three times a day (TID) | ORAL | Status: AC | PRN

## 2011-07-06 MED ORDER — HYDROCODONE-ACETAMINOPHEN 5-325 MG PO TABS: 1.0000 | ORAL_TABLET | ORAL | Status: AC | PRN

## 2011-07-06 NOTE — Progress Notes (Signed)
UR COMPLETED  

## 2011-07-06 NOTE — Discharge Summary (Signed)
Physician Discharge Summary  Patient ID: Janice Wilson MRN: [id number] DOB/AGE: 1956-02-24 56 y.o.  Admit date: 07/05/2011 Discharge date: 07/06/2011  Admission Diagnoses:  Discharge Diagnoses:  Principal Problem:  *Cervical spinal stenosis   Discharged Condition: good  Hospital Course: Admitted to the hospital where she underwent an uncomplicated three-level anterior cervical discectomy and fusion. Postoperative she is done well. She had resolution of her preoperative neck and upper cavity symptoms. Her her sensation are intact and she feels ready for discharge home.  Consults: none  Significant Diagnostic Studies: None  Treatments: surgery: C4-5 C5-6 C6-7 anterior cervical discectomy and fusion with allograft and anterior plating.  Discharge Exam: Blood pressure 151/88, pulse 67, temperature 99 F (37.2 C), temperature source Oral, resp. rate 16, SpO2 95.00%. Awake and alert oriented appropriate  Nerve function is intact. Motor and sensory function extremities is normal. Wound healing well. Chest and abdomen exam benign.  Disposition: Final discharge disposition not confirmed   Medication List  As of 07/06/2011 10:56 AM   TAKE these medications         acetaminophen 325 MG tablet   Commonly known as: TYLENOL   Take 325 mg by mouth every 6 (six) hours as needed. For pain      albuterol 108 (90 BASE) MCG/ACT inhaler   Commonly known as: PROVENTIL HFA;VENTOLIN HFA   Inhale 1 puff into the lungs 2 (two) times daily as needed. For shortness of breath      cephALEXin 500 MG capsule   Commonly known as: KEFLEX   Take 500 mg by mouth 2 (two) times daily.      cyclobenzaprine 10 MG tablet   Commonly known as: FLEXERIL   Take 1 tablet (10 mg total) by mouth 3 (three) times daily as needed for muscle spasms.      fluticasone 50 MCG/ACT nasal spray   Commonly known as: FLONASE   Place 2 sprays into the nose daily as needed. FOR CONGESTION      Fluticasone-Salmeterol  250-50 MCG/DOSE Aepb   Commonly known as: ADVAIR   Inhale 1 puff into the lungs every 12 (twelve) hours as needed. FOR WHEEZING      HYDROcodone-acetaminophen 5-325 MG per tablet   Commonly known as: NORCO   Take 1-2 tablets by mouth every 4 (four) hours as needed.      ibuprofen 800 MG tablet   Commonly known as: ADVIL,MOTRIN   Take 800 mg by mouth daily as needed. FOR PAIN      LORazepam 0.5 MG tablet   Commonly known as: ATIVAN   Take 0.5 mg by mouth daily.      meclizine 25 MG tablet   Commonly known as: ANTIVERT   Take 25 mg by mouth 3 (three) times daily as needed. For vertigo        sulfamethoxazole-trimethoprim 800-160 MG per tablet   Commonly known as: BACTRIM DS   Take 1 tablet by mouth 2 (two) times daily.      timolol 0.5 % ophthalmic solution   Commonly known as: BETIMOL   Place 1 drop into both eyes every morning.      XALATAN 0.005 % ophthalmic solution   Generic drug: latanoprost   Place 1 drop into both eyes at bedtime.           Follow-up Information    Follow up with Phillip Sandler Wilson. Call in 1 week.   Contact information:   [address] [place] [address] [phone]  Signed: Edelmira Gallogly Wilson 07/06/2011, 10:56 AM

## 2011-08-13 ENCOUNTER — Ambulatory Visit: Payer: Self-pay | Admitting: Family Medicine

## 2011-08-25 ENCOUNTER — Ambulatory Visit
Admission: RE | Admit: 2011-08-25 | Discharge: 2011-08-25 | Disposition: A | Discharge status: Home / Self Care | Payer: Medicare Other | Source: Ambulatory Visit | Attending: Neurosurgery | Admitting: Neurosurgery

## 2011-08-25 ENCOUNTER — Other Ambulatory Visit: Payer: Self-pay | Admitting: Neurosurgery

## 2011-08-25 DIAGNOSIS — M4802 Spinal stenosis, cervical region: Secondary | ICD-10-CM

## 2011-10-13 ENCOUNTER — Other Ambulatory Visit: Payer: Self-pay | Admitting: Neurosurgery

## 2011-10-13 DIAGNOSIS — M4802 Spinal stenosis, cervical region: Secondary | ICD-10-CM

## 2011-10-21 ENCOUNTER — Ambulatory Visit
Admission: RE | Admit: 2011-10-21 | Discharge: 2011-10-21 | Disposition: A | Discharge status: Home / Self Care | Payer: Medicare Other | Source: Ambulatory Visit | Attending: Neurosurgery | Admitting: Neurosurgery

## 2011-10-21 DIAGNOSIS — M4802 Spinal stenosis, cervical region: Secondary | ICD-10-CM

## 2011-11-11 ENCOUNTER — Emergency Department: Payer: Self-pay | Admitting: *Deleted

## 2012-01-12 ENCOUNTER — Other Ambulatory Visit: Payer: Self-pay | Admitting: Neurosurgery

## 2012-01-12 DIAGNOSIS — M545 Low back pain, unspecified: Secondary | ICD-10-CM

## 2012-01-21 ENCOUNTER — Ambulatory Visit
Admission: RE | Admit: 2012-01-21 | Discharge: 2012-01-21 | Disposition: A | Discharge status: Home / Self Care | Payer: Medicare Other | Source: Ambulatory Visit | Attending: Neurosurgery | Admitting: Neurosurgery

## 2012-01-21 DIAGNOSIS — M545 Low back pain, unspecified: Secondary | ICD-10-CM

## 2012-04-03 ENCOUNTER — Ambulatory Visit: Payer: Self-pay

## 2012-05-31 ENCOUNTER — Ambulatory Visit: Payer: Self-pay | Admitting: Unknown Physician Specialty

## 2012-08-14 ENCOUNTER — Ambulatory Visit: Payer: Self-pay | Admitting: Family Medicine

## 2012-12-04 ENCOUNTER — Ambulatory Visit: Payer: Self-pay | Admitting: Family Medicine

## 2013-01-23 ENCOUNTER — Other Ambulatory Visit: Payer: Self-pay | Admitting: Neurosurgery

## 2013-01-23 DIAGNOSIS — M4712 Other spondylosis with myelopathy, cervical region: Secondary | ICD-10-CM

## 2013-02-05 ENCOUNTER — Ambulatory Visit
Admission: RE | Admit: 2013-02-05 | Discharge: 2013-02-05 | Disposition: A | Discharge status: Home / Self Care | Payer: Medicare Other | Source: Ambulatory Visit | Attending: Neurosurgery | Admitting: Neurosurgery

## 2013-02-05 DIAGNOSIS — M4712 Other spondylosis with myelopathy, cervical region: Secondary | ICD-10-CM

## 2013-06-25 ENCOUNTER — Ambulatory Visit: Payer: Self-pay | Admitting: Family Medicine

## 2013-08-17 ENCOUNTER — Ambulatory Visit: Payer: Medicare Other | Admitting: Internal Medicine

## 2013-08-21 ENCOUNTER — Ambulatory Visit: Payer: Self-pay | Admitting: Family Medicine

## 2013-08-28 ENCOUNTER — Encounter: Payer: Medicare Other | Admitting: Internal Medicine

## 2013-08-28 NOTE — Progress Notes (Signed)
This encounter was created in error - please disregard.

## 2013-09-14 ENCOUNTER — Encounter: Payer: Self-pay | Admitting: Internal Medicine

## 2013-09-14 ENCOUNTER — Ambulatory Visit (INDEPENDENT_AMBULATORY_CARE_PROVIDER_SITE_OTHER): Payer: Commercial Managed Care - HMO | Admitting: Internal Medicine

## 2013-09-14 VITALS — BP 124/80 | HR 73 | Temp 98.2°F | Resp 12 | Ht 65.0 in | Wt 168.0 lb

## 2013-09-14 DIAGNOSIS — L659 Nonscarring hair loss, unspecified: Secondary | ICD-10-CM

## 2013-09-14 LAB — VITAMIN B12: Vitamin B-12: 1418 pg/mL — ABNORMAL HIGH (ref 211–911)

## 2013-09-14 LAB — T4, FREE: Free T4: 0.66 ng/dL (ref 0.60–1.60)

## 2013-09-14 LAB — TSH: TSH: 0.34 u[IU]/mL — ABNORMAL LOW (ref 0.35–5.50)

## 2013-09-14 LAB — T3, FREE: T3, Free: 2.9 pg/mL (ref 2.3–4.2)

## 2013-09-14 NOTE — Progress Notes (Signed)
Patient ID: Janice Wilson, female   DOB: 05/11/1956, 58 y.o.   MRN: [id number]   HPI  Janice Wilson is a 58 y.o.-year-old female, referred by her PCP, Dr. Manuella Ghazi, for evaluation for hair loss.  Pt. has been noticing hair loss in 10-04/2013. She stopped dying her hair and perms in ~05/2013. She stopped using the product Base and now changed hair salons and hair is better after stopped using the Base.  Her hair comes out in clumps every time she touched her hair. No bald spot, it comes out uniformly. She describes a lot of stress in the last 6 mo >> with her daughter and grandchild moving in, but now pt moved out of the house and stress is better.   She did not see a dermatologist yet.  Pt describes: - + some heat intolerance - + weight gain - no fatigue - + constipation - no dry skin - + anxiety and + depression  She had menopause at 58 y/o >> TAH but not BSO. She has hot flushes.   She takes Biotin and a MVI. Exercises by walking.  No FH of thyroid disorders.  We asked for records from PCP, which arrived during appt: - 02/15/2013: TSH 1.420, HbA1c 5.9%. - 06/25/2013: CBC with diff normal; CMP normal, except Glu 119 (nonfasting) Will scan the rest in Epic.  ROS: Constitutional: See HPI, + poor sleep Eyes: + blurry vision, no xerophthalmia ENT: no sore throat, no nodules palpated in throat, no dysphagia/odynophagia, no hoarseness, + tinnitus, + hypoacusis Cardiovascular: no CP/+ SOB/no palpitations/+ leg swelling Respiratory: no cough/+ SOB Gastrointestinal: no N/V/+ C, + heartburn Musculoskeletal: + muscle aches/+ joint aches Skin: no rashes, + itching, + easy bruising, + hair loss Neurological: no tremors/numbness/tingling/dizziness, + HA Psychiatric: + both: depression/anxiety Low libido  Past Medical History  Diagnosis Date  . Asthma   . Shortness of breath   . Sleep apnea     uses CPAP machine sleep study 9/ 2012 done at sleep med in [place]  . Anxiety   .  Headache(784.0)   . Glaucoma   . Vertigo   . Panic attack   . Depression    Past Surgical History  Procedure Laterality Date  . Partial hysterectomy    . Cesarean section    . Anterior cervical decomp/discectomy fusion  07/05/2011    Procedure: ANTERIOR CERVICAL DECOMPRESSION/DISCECTOMY FUSION 3 LEVELS;  Surgeon: Cooper Render Pool;  Location: [place] [facility];  Service: Neurosurgery;  Laterality: N/A;  Cervical Four-Five, Cervical Five-Six, Cervical Six-Seven Anterior Cervical Decompression Fusion WITH ALLOGRAFT AND PLATING   History   Social History  . Marital Status: Single    Spouse Name: N/A    Number of Children: 1   Occupational History  . Disabled   Social History Main Topics  . Smoking status: Former Smoker -- 0.25 packs/day for 21 years    Quit date: 02/19/2013  . Smokeless tobacco: Never Used  . Alcohol Use: Yes     Comment: 1-2 beer a month  . Drug Use: No   Current Outpatient Prescriptions on File Prior to Visit  Medication Sig Dispense Refill  . acetaminophen (TYLENOL) 325 MG tablet Take 325 mg by mouth every 6 (six) hours as needed. For pain       . albuterol (PROVENTIL HFA;VENTOLIN HFA) 108 (90 BASE) MCG/ACT inhaler Inhale 1 puff into the lungs every 4 (four) hours as needed. For shortness of breath      . fluticasone (FLONASE) 50  MCG/ACT nasal spray Place 2 sprays into the nose daily as needed. FOR CONGESTION       . Fluticasone-Salmeterol (ADVAIR) 250-50 MCG/DOSE AEPB Inhale 1 puff into the lungs every 12 (twelve) hours as needed. FOR WHEEZING       . ibuprofen (ADVIL,MOTRIN) 800 MG tablet Take 800 mg by mouth daily as needed. FOR PAIN       . latanoprost (XALATAN) 0.005 % ophthalmic solution Place 1 drop into both eyes at bedtime.        Marland Kitchen LORazepam (ATIVAN) 0.5 MG tablet Take 0.5 mg by mouth daily.        . meclizine (ANTIVERT) 25 MG tablet Take 25 mg by mouth 3 (three) times daily as needed. For vertigo        . timolol (BETIMOL) 0.5 % ophthalmic solution Place 1  drop into both eyes every morning.        . cephALEXin (KEFLEX) 500 MG capsule Take 500 mg by mouth 2 (two) times daily.        Marland Kitchen sulfamethoxazole-trimethoprim (BACTRIM DS) 800-160 MG per tablet Take 1 tablet by mouth 2 (two) times daily.         No current facility-administered medications on file prior to visit.   Allergies  Allergen Reactions  . Oxycodone Hcl Other (See Comments)    REACTION:"MAKES ME PALE"   Family History  Problem Relation Age of Onset  . Anesthesia problems Neg Hx    PE: BP 124/80  Pulse 73  Temp(Src) 98.2 F (36.8 C) (Oral)  Resp 12  Ht 5\' 5"  (1.651 m)  Wt 168 lb (76.204 kg)  BMI 27.96 kg/m2  SpO2 97% Wt Readings from Last 3 Encounters:  09/14/13 168 lb (76.204 kg)  06/28/11 159 lb 9.8 oz (72.4 kg)   Constitutional: overweight, in NAD Eyes: PERRLA, EOMI, no exophthalmos ENT: moist mucous membranes, no thyromegaly, no cervical lymphadenopathy Cardiovascular: RRR, No MRG Respiratory: CTA B Gastrointestinal: abdomen soft, NT, ND, BS+ Musculoskeletal: no deformities, strength intact in all 4 Skin: moist, warm, no rashes, hair breaking and thinning - no alopecia areata Neurological: no tremor with outstretched hands, DTR normal in all 4  ASSESSMENT: 1. Hair loss  PLAN:  We discussed about possible causes of alopecia. For her, the hair loss can be related to her hair products used. However, there are other possible causes:  Hypothyroidism - will check TFTs today  Pregnancy - she is postmenopausal  Menopause   Poor diet - she recently improved her diet: more plant-based  Stress - improved recently (see HPI)  Vitamin deficiencies - advised to take B12 vitamin and B6 vitamin supplements; is not taking excess vitamin A   Micronutrient deficiencies  - iron, zinc  Advised her to get all essential amino acids from the diet (especially L-lysine)  Medications - she does not appear to on meds that cause hair loss Will check a B12 vitamin level and  TFTs. Will schedule anew appt if thyroid labs are abnormal.   Office Visit on 09/14/2013  Component Date Value Ref Range Status  . Vitamin B-12 09/14/2013 1418* 211 - 911 pg/mL Final  . TSH 09/14/2013 0.34* 0.35 - 5.50 uIU/mL Final  . Free T4 09/14/2013 0.66  0.60 - 1.60 ng/dL Final  . T3, Free 09/14/2013 2.9  2.3 - 4.2 pg/mL Final   TSH a little low >> I would like to repeat TFTs in 2 mo. B12 a little high.

## 2013-09-14 NOTE — Patient Instructions (Signed)
Please stop at the lab. We will schedule a new appt if the labs are abnormal. 

## 2013-09-17 ENCOUNTER — Telehealth: Payer: Self-pay | Admitting: Internal Medicine

## 2013-09-17 ENCOUNTER — Encounter: Payer: Self-pay | Admitting: Internal Medicine

## 2013-09-17 NOTE — Telephone Encounter (Signed)
Pt would like her test results explained to her

## 2013-09-18 NOTE — Telephone Encounter (Signed)
No danger with a slightly higher B12. Regarding Safslim, I cannot give her a medical opinion since this is not FDA regulated, but reviewing the label, it does not appear to interfere with B12 or thyroid levels. Unlikely that the cervical surgery incision affected her thyroid levels.

## 2013-09-18 NOTE — Telephone Encounter (Signed)
Called pt per Dr Arman Filter note. Pt understood.

## 2013-09-18 NOTE — Telephone Encounter (Signed)
Returned pt's call. Pt concerned about her B-12 levels. She wants to know if there is any kind of danger with this number, 1418? Pt also stated that she had fusion surgery on her spine and they went in the front of her neck to do this surgery, she is concerned this may have affected her thyroid. Does Dr Cruzita Lederer believe this had any affect or damaged her thryoid? Also, pt states she is taking this OTC Safslim for reducing belly fat, 2 tsp daily. Pt wants to know if this could affect her thyroid or her B-12? Please advise. Thank you.

## 2013-10-08 ENCOUNTER — Emergency Department: Payer: Self-pay | Admitting: Emergency Medicine

## 2013-10-08 LAB — CBC WITH DIFFERENTIAL/PLATELET
Basophil #: 0.1 10*3/uL (ref 0.0–0.1)
Basophil %: 0.8 %
Eosinophil #: 0.1 10*3/uL (ref 0.0–0.7)
Eosinophil %: 0.8 %
HCT: 40.8 % (ref 35.0–47.0)
HGB: 13.9 g/dL (ref 12.0–16.0)
Lymphocyte #: 3 10*3/uL (ref 1.0–3.6)
Lymphocyte %: 30.2 %
MCH: 29.9 pg (ref 26.0–34.0)
MCHC: 34 g/dL (ref 32.0–36.0)
MCV: 88 fL (ref 80–100)
Monocyte #: 0.6 x10 3/mm (ref 0.2–0.9)
Monocyte %: 6.2 %
Neutrophil #: 6.2 10*3/uL (ref 1.4–6.5)
Neutrophil %: 62 %
Platelet: 235 10*3/uL (ref 150–440)
RBC: 4.65 10*6/uL (ref 3.80–5.20)
RDW: 13.7 % (ref 11.5–14.5)
WBC: 10 10*3/uL (ref 3.6–11.0)

## 2013-10-08 LAB — BASIC METABOLIC PANEL
Anion Gap: 6 — ABNORMAL LOW (ref 7–16)
BUN: 14 mg/dL (ref 7–18)
Calcium, Total: 9 mg/dL (ref 8.5–10.1)
Chloride: 106 mmol/L (ref 98–107)
Co2: 26 mmol/L (ref 21–32)
Creatinine: 0.58 mg/dL — ABNORMAL LOW (ref 0.60–1.30)
EGFR (African American): >60
EGFR (Non-African Amer.): >60
Glucose: 91 mg/dL (ref 65–99)
Osmolality: 276 (ref 275–301)
Potassium: 3.7 mmol/L (ref 3.5–5.1)
Sodium: 138 mmol/L (ref 136–145)

## 2013-11-06 ENCOUNTER — Ambulatory Visit: Payer: Self-pay | Admitting: Family Medicine

## 2013-11-13 ENCOUNTER — Ambulatory Visit: Payer: Medicare Other | Admitting: Internal Medicine

## 2013-11-16 ENCOUNTER — Ambulatory Visit: Payer: Medicare Other | Admitting: Internal Medicine

## 2013-12-27 ENCOUNTER — Ambulatory Visit: Payer: Commercial Managed Care - HMO | Admitting: Internal Medicine

## 2014-01-31 ENCOUNTER — Ambulatory Visit: Payer: Commercial Managed Care - HMO | Admitting: Internal Medicine

## 2014-04-15 ENCOUNTER — Emergency Department: Payer: Self-pay | Admitting: Emergency Medicine

## 2014-04-16 DIAGNOSIS — J4521 Mild intermittent asthma with (acute) exacerbation: Secondary | ICD-10-CM | POA: Insufficient documentation

## 2014-04-24 ENCOUNTER — Ambulatory Visit: Payer: Self-pay | Admitting: Family Medicine

## 2014-06-06 ENCOUNTER — Ambulatory Visit: Payer: Self-pay | Admitting: Neurosurgery

## 2014-07-31 DIAGNOSIS — N8110 Cystocele, unspecified: Secondary | ICD-10-CM | POA: Insufficient documentation

## 2014-08-30 ENCOUNTER — Ambulatory Visit: Payer: Self-pay | Admitting: Family Medicine

## 2014-09-11 ENCOUNTER — Ambulatory Visit: Payer: Self-pay | Admitting: Neurosurgery

## 2014-12-16 ENCOUNTER — Telehealth: Payer: Self-pay | Admitting: Family Medicine

## 2014-12-16 NOTE — Telephone Encounter (Signed)
Pt needs refill on Lorazepam and she uses the [facility] in [place]. Pt has appt sched 01/01/15 with Manuella Ghazi.

## 2014-12-17 ENCOUNTER — Other Ambulatory Visit: Payer: Self-pay | Admitting: Family Medicine

## 2014-12-17 MED ORDER — LORAZEPAM 0.5 MG PO TABS: 0.5000 mg | ORAL_TABLET | Freq: Every day | ORAL | Status: DC

## 2014-12-18 NOTE — Telephone Encounter (Signed)
Hard script placed at the front desk.

## 2015-01-01 ENCOUNTER — Ambulatory Visit: Payer: Self-pay | Admitting: Family Medicine

## 2015-01-02 ENCOUNTER — Ambulatory Visit: Payer: Self-pay | Admitting: Family Medicine

## 2015-01-02 ENCOUNTER — Encounter: Payer: Self-pay | Admitting: Family Medicine

## 2015-01-02 ENCOUNTER — Ambulatory Visit (INDEPENDENT_AMBULATORY_CARE_PROVIDER_SITE_OTHER): Payer: PPO | Admitting: Family Medicine

## 2015-01-02 VITALS — BP 133/73 | HR 63 | Temp 98.4°F | Ht 65.0 in | Wt 166.4 lb

## 2015-01-02 DIAGNOSIS — F418 Other specified anxiety disorders: Secondary | ICD-10-CM | POA: Diagnosis not present

## 2015-01-02 DIAGNOSIS — F419 Anxiety disorder, unspecified: Secondary | ICD-10-CM

## 2015-01-02 DIAGNOSIS — G8929 Other chronic pain: Secondary | ICD-10-CM

## 2015-01-02 DIAGNOSIS — M79671 Pain in right foot: Secondary | ICD-10-CM

## 2015-01-02 DIAGNOSIS — F32A Depression, unspecified: Secondary | ICD-10-CM | POA: Insufficient documentation

## 2015-01-02 DIAGNOSIS — M542 Cervicalgia: Secondary | ICD-10-CM | POA: Diagnosis not present

## 2015-01-02 DIAGNOSIS — E785 Hyperlipidemia, unspecified: Secondary | ICD-10-CM

## 2015-01-02 DIAGNOSIS — F329 Major depressive disorder, single episode, unspecified: Secondary | ICD-10-CM

## 2015-01-02 DIAGNOSIS — R030 Elevated blood-pressure reading, without diagnosis of hypertension: Secondary | ICD-10-CM | POA: Diagnosis not present

## 2015-01-02 DIAGNOSIS — G4701 Insomnia due to medical condition: Secondary | ICD-10-CM | POA: Insufficient documentation

## 2015-01-02 DIAGNOSIS — G4700 Insomnia, unspecified: Secondary | ICD-10-CM

## 2015-01-02 MED ORDER — SERTRALINE HCL 50 MG PO TABS: 50.0000 mg | ORAL_TABLET | Freq: Every day | ORAL | Status: DC

## 2015-01-02 MED ORDER — GABAPENTIN 300 MG PO CAPS: 300.0000 mg | ORAL_CAPSULE | Freq: Three times a day (TID) | ORAL | Status: DC

## 2015-01-02 MED ORDER — LORAZEPAM 0.5 MG PO TABS: 0.5000 mg | ORAL_TABLET | Freq: Every day | ORAL | Status: DC
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
## 2015-01-23 ENCOUNTER — Telehealth: Payer: Self-pay

## 2015-01-23 LAB — LIPID PANEL
Chol/HDL Ratio: 5.3 ratio units — ABNORMAL HIGH (ref 0.0–4.4)
Cholesterol, Total: 219 mg/dL — ABNORMAL HIGH (ref 100–199)
HDL: 41 mg/dL (ref >39)
LDL Calculated: 153 mg/dL — ABNORMAL HIGH (ref 0–99)
Triglycerides: 125 mg/dL (ref 0–149)
VLDL Cholesterol Cal: 25 mg/dL (ref 5–40)

## 2015-01-23 LAB — HEMOGLOBIN A1C
Est. average glucose Bld gHb Est-mCnc: 120 mg/dL
Hgb A1c MFr Bld: 5.8 % — ABNORMAL HIGH (ref 4.8–5.6)

## 2015-01-23 NOTE — Telephone Encounter (Signed)
-----   Message from Roselee Nova, MD sent at 01/23/2015 10:40 AM EDT ----- Fasting lipid panel shows elevated total cholesterol at 219, elevated LDL at 153. Triglycerides and HDL are within the normal range. Both total cholesterol and LDL are improved from February 2016 although still elevated. 10 year risk of CVD is calculated at 2%. Patient should improve dietary and lifestyle factors to lower her cholesterol. Recheck in 3 months A1c is elevated at 5.8% and is within the prediabetes range. It is relatively unchanged from 5.9% from September 2014. She should continue with dietary and lifestyle modifications and recheck in 1 year

## 2015-01-23 NOTE — Telephone Encounter (Signed)
Patient requesting refill. 

## 2015-01-29 ENCOUNTER — Telehealth: Payer: Self-pay | Admitting: *Deleted

## 2015-01-29 ENCOUNTER — Ambulatory Visit (INDEPENDENT_AMBULATORY_CARE_PROVIDER_SITE_OTHER): Payer: PPO

## 2015-01-29 ENCOUNTER — Ambulatory Visit (INDEPENDENT_AMBULATORY_CARE_PROVIDER_SITE_OTHER): Payer: PPO | Admitting: Podiatry

## 2015-01-29 VITALS — BP 114/74 | HR 75 | Resp 16 | Ht 65.0 in | Wt 162.0 lb

## 2015-01-29 DIAGNOSIS — M2580 Other specified joint disorders, unspecified joint: Secondary | ICD-10-CM | POA: Diagnosis not present

## 2015-01-29 DIAGNOSIS — M722 Plantar fascial fibromatosis: Secondary | ICD-10-CM

## 2015-01-29 DIAGNOSIS — M779 Enthesopathy, unspecified: Secondary | ICD-10-CM

## 2015-01-29 MED ORDER — METHYLPREDNISOLONE 4 MG PO TBPK: ORAL_TABLET | ORAL | Status: DC

## 2015-01-29 MED ORDER — MELOXICAM 15 MG PO TABS: 15.0000 mg | ORAL_TABLET | Freq: Every day | ORAL | Status: DC

## 2015-01-29 NOTE — Progress Notes (Signed)
   Subjective:    Patient ID: Janice Wilson, female    DOB: [birth year], 57 y.o.   MRN: [id number]  HPI  Bilateral foot pain , the right foot has been hurting for about a year . The top of foot to the bottom of foot, gets a shooting pain. The left one hurts through the center of the foot and the heel. Saw a doctor at [facility] clinic who made me a pad for it   Review of Systems     Objective:   Physical Exam: I have        reviewed her past medical history medications allergies surgeries social history review of systems chief complaint and statement. Pulses are strongly palpable bilateral. Neurologic sensorium is intact per Semmes-Weinstein monofilament. Deep tendon reflexes are intact bilateral muscle strength is equal bilateral and +5 over 5 dorsiflexion plantar flexion inversion everters all intrinsic musculature is intact. Orthopedic evaluation demonstrates mild pes planus rectus foot type overall bilateral foot. Mild tenderness on range of motion of the first metatarsophalangeal joint with particular tenderness of the fibular sesamoid right. She has no pain on palpation remainder of the right foot. She does have pain on palpation at the medial calcaneal tubercle of the left heel. Minimal edema no erythema saline strange or odor radiographs confirm soft tissue increase in density of the plantar fascial insertion site of the left foot indicative of plantar fasciitis. Sesamoiditis appears to be diagnosis of the sesamoid right foot demonstrating no changes on radiographic evaluation.  Assessment & Plan:   assessment: Plantar fasciitis left foot. Sesamoiditis right foot.  Plan: Discussed etiology pathology conservative versus surgical therapies. Injected Kenalog to the sesamoidal apparatus. I injected the left heel today with Kenalog and local and aesthetic. Discussed appropriate shoe gear stretching exercises ice therapy and shoe gear modifications. Discussed the need for possible orthotics  next visit. Placed her on a Medrol Dosepak to be followed by meloxicam. Encouraged use of tennis shoes. I will follow-up with her in 1 month.

## 2015-01-29 NOTE — Telephone Encounter (Signed)
Pt states she was given 2 rx by Dr. Milinda Pointer but is not certain how to take them.  Pt was prescribed Medrol dose pack and Meloxicam.  I instructed pt to take the 6 day Medrol dose pack as instructed 1st then take the Meloxicam as directed.

## 2015-01-30 LAB — CBC WITH DIFFERENTIAL/PLATELET
Basophils Absolute: 0 10*3/uL (ref 0.0–0.2)
Basos: 0 %
EOS (ABSOLUTE): 0.1 10*3/uL (ref 0.0–0.4)
Eos: 1 %
Hematocrit: 42.1 % (ref 34.0–46.6)
Hemoglobin: 13.8 g/dL (ref 11.1–15.9)
Immature Grans (Abs): 0 10*3/uL (ref 0.0–0.1)
Immature Granulocytes: 0 %
Lymphocytes Absolute: 3.6 10*3/uL — ABNORMAL HIGH (ref 0.7–3.1)
Lymphs: 43 %
MCH: 29.4 pg (ref 26.6–33.0)
MCHC: 32.8 g/dL (ref 31.5–35.7)
MCV: 90 fL (ref 79–97)
Monocytes Absolute: 0.5 10*3/uL (ref 0.1–0.9)
Monocytes: 6 %
Neutrophils Absolute: 4.1 10*3/uL (ref 1.4–7.0)
Neutrophils: 50 %
Platelets: 262 10*3/uL (ref 150–379)
RBC: 4.7 x10E6/uL (ref 3.77–5.28)
RDW: 14.5 % (ref 12.3–15.4)
WBC: 8.3 10*3/uL (ref 3.4–10.8)

## 2015-01-30 LAB — URIC ACID: Uric Acid: 5.1 mg/dL (ref 2.5–7.1)

## 2015-01-30 LAB — ANA: Anti Nuclear Antibody(ANA): NEGATIVE

## 2015-01-30 LAB — SEDIMENTATION RATE: Sed Rate: 14 mm/hr (ref 0–40)

## 2015-01-30 LAB — RHEUMATOID FACTOR: Rhuematoid fact SerPl-aCnc: 26.6 IU/mL — ABNORMAL HIGH (ref 0.0–13.9)

## 2015-01-30 NOTE — Progress Notes (Signed)
Patient aware of referral to rheumatologist. [facility] request all notes and labs , will contact the patient when ready to schedule

## 2015-02-04 ENCOUNTER — Ambulatory Visit (INDEPENDENT_AMBULATORY_CARE_PROVIDER_SITE_OTHER): Payer: PPO | Admitting: Family Medicine

## 2015-02-04 ENCOUNTER — Encounter: Payer: Self-pay | Admitting: Family Medicine

## 2015-02-04 VITALS — BP 118/77 | HR 64 | Temp 98.9°F | Resp 17 | Ht 65.0 in | Wt 161.4 lb

## 2015-02-04 DIAGNOSIS — Z0000 Encounter for general adult medical examination without abnormal findings: Secondary | ICD-10-CM

## 2015-02-04 NOTE — Progress Notes (Signed)
Name: Janice Wilson   MRN: [id number]    DOB: 1956-05-14   Date:02/04/2015       Progress Note  Subjective  Chief Complaint  Chief Complaint  Patient presents with  . Annual Exam    CPE  . Hyperlipidemia  . Anxiety    HPI Pt. Is here for an annual physical exam. She is doing well.   Past Medical History  Diagnosis Date  . Asthma   . Shortness of breath   . Sleep apnea     uses CPAP machine sleep study 9/ 2012 done at sleep med in [place]  . Anxiety   . Headache(784.0)   . Glaucoma   . Vertigo   . Panic attack   . Depression     Past Surgical History  Procedure Laterality Date  . Partial hysterectomy    . Cesarean section    . Anterior cervical decomp/discectomy fusion  07/05/2011    Procedure: ANTERIOR CERVICAL DECOMPRESSION/DISCECTOMY FUSION 3 LEVELS;  Surgeon: Cooper Render Pool;  Location: [facility];  Service: Neurosurgery;  Laterality: N/A;  Cervical Four-Five, Cervical Five-Six, Cervical Six-Seven Anterior Cervical Decompression Fusion WITH ALLOGRAFT AND PLATING    Family History  Problem Relation Age of Onset  . Anesthesia problems Neg Hx   . Cancer Mother   . Stroke Father     Social History   Social History  . Marital Status: Single    Spouse Name: N/A  . Number of Children: N/A  . Years of Education: N/A   Occupational History  . Not on file.   Social History Main Topics  . Smoking status: Former Smoker -- 0.25 packs/day for 21 years    Quit date: 02/19/2013  . Smokeless tobacco: Never Used  . Alcohol Use: Yes     Comment: 1-2 beer a month  . Drug Use: No  . Sexual Activity: Not on file   Other Topics Concern  . Not on file   Social History Narrative     Current outpatient prescriptions:  .  acetaminophen (TYLENOL) 325 MG tablet, Take 325 mg by mouth every 6 (six) hours as needed. For pain , Disp: , Rfl:  .  albuterol (PROVENTIL HFA;VENTOLIN HFA) 108 (90 BASE) MCG/ACT inhaler, Inhale 1 puff into the lungs every 4 (four) hours as  needed. For shortness of breath, Disp: , Rfl:  .  aspirin 81 MG tablet, Take 81 mg by mouth daily., Disp: , Rfl:  .  carisoprodol (SOMA) 350 MG tablet, Take 350 mg by mouth 4 (four) times daily as needed for muscle spasms., Disp: , Rfl:  .  cephALEXin (KEFLEX) 500 MG capsule, Take 500 mg by mouth 2 (two) times daily.  , Disp: , Rfl:  .  cyclobenzaprine (FLEXERIL) 10 MG tablet, Take 10 mg by mouth daily., Disp: , Rfl:  .  eszopiclone (LUNESTA) 2 MG TABS tablet, 1 PO HS PRN INSOMNIA, Disp: , Rfl:  .  fluticasone (FLONASE) 50 MCG/ACT nasal spray, Place 2 sprays into the nose daily as needed. FOR CONGESTION , Disp: , Rfl:  .  Fluticasone-Salmeterol (ADVAIR) 250-50 MCG/DOSE AEPB, Inhale 1 puff into the lungs every 12 (twelve) hours as needed. FOR WHEEZING , Disp: , Rfl:  .  gabapentin (NEURONTIN) 300 MG capsule, Take 1 capsule (300 mg total) by mouth 3 (three) times daily., Disp: 90 capsule, Rfl: 2 .  ibuprofen (ADVIL,MOTRIN) 800 MG tablet, Take 800 mg by mouth daily as needed. FOR PAIN , Disp: , Rfl:  .  latanoprost (XALATAN) 0.005 % ophthalmic solution, Place 1 drop into both eyes at bedtime.  , Disp: , Rfl:  .  loratadine (CLARITIN) 10 MG tablet, Take 10 mg by mouth at bedtime., Disp: , Rfl:  .  LORazepam (ATIVAN) 0.5 MG tablet, Take 1 tablet (0.5 mg total) by mouth at bedtime., Disp: 30 tablet, Rfl: 2 .  meclizine (ANTIVERT) 25 MG tablet, Take 25 mg by mouth 3 (three) times daily as needed. For vertigo  , Disp: , Rfl:  .  Melatonin 1 MG CAPS, Take 1 capsule by mouth daily., Disp: , Rfl:  .  meloxicam (MOBIC) 15 MG tablet, Take 1 tablet (15 mg total) by mouth daily., Disp: 30 tablet, Rfl: 2 .  methylPREDNISolone (MEDROL DOSEPAK) 4 MG TBPK tablet, Tapering 6 day dos, Disp: 21 tablet, Rfl: 0 .  Multiple Vitamins-Minerals (MULTIPLE VITAMINS/WOMENS PO), Take 1 tablet by mouth daily., Disp: , Rfl:  .  sertraline (ZOLOFT) 50 MG tablet, Take 1 tablet (50 mg total) by mouth at bedtime., Disp: 30 tablet, Rfl:  2 .  sulfamethoxazole-trimethoprim (BACTRIM DS) 800-160 MG per tablet, Take 1 tablet by mouth 2 (two) times daily.  , Disp: , Rfl:  .  timolol (BETIMOL) 0.5 % ophthalmic solution, Place 1 drop into both eyes every morning.  , Disp: , Rfl:   Allergies  Allergen Reactions  . Oxycodone Hcl Other (See Comments)    REACTION:"MAKES ME PALE"  . Propoxyphene Other (See Comments)  . Propoxyphene      Review of Systems  Constitutional: Negative for fever, chills, weight loss and malaise/fatigue.  HENT: Negative for congestion and ear pain.   Eyes: Negative for blurred vision, double vision and pain.  Respiratory: Negative for cough, sputum production and shortness of breath.   Cardiovascular: Negative for chest pain and palpitations.  Gastrointestinal: Negative for heartburn, nausea, vomiting, abdominal pain and blood in stool.  Genitourinary: Negative for dysuria, urgency and frequency.  Musculoskeletal: Positive for neck pain. Negative for myalgias and back pain.  Skin: Negative for itching and rash.  Neurological: Negative for dizziness, seizures, loss of consciousness and headaches.  Endo/Heme/Allergies: Negative for environmental allergies. Does not bruise/bleed easily.  Psychiatric/Behavioral: Positive for depression. The patient is nervous/anxious and has insomnia.       Objective  Filed Vitals:   02/04/15 0941  BP: 118/77  Pulse: 64  Temp: 98.9 F (37.2 C)  TempSrc: Oral  Resp: 17  Height: 5\' 5"  (1.651 m)  Weight: 161 lb 6.4 oz (73.211 kg)  SpO2: 98%    Physical Exam  Constitutional: She is oriented to person, place, and time and well-developed, well-nourished, and in no distress.  HENT:  Head: Normocephalic and atraumatic.  Eyes: Pupils are equal, round, and reactive to light.  Neck: Neck supple.  Small, nontender palpable mass over the left neck anterior to the left ear.  Cardiovascular: Normal rate and regular rhythm.   Pulmonary/Chest: Effort normal and breath  sounds normal. Right breast exhibits no mass and no tenderness. Left breast exhibits no mass and no tenderness. Breasts are symmetrical.  Abdominal: Soft. Bowel sounds are normal. There is no tenderness.  Genitourinary: Vagina normal and cervix normal. Cervix exhibits no motion tenderness and no lesion.  Musculoskeletal: Normal range of motion.  Neurological: She is alert and oriented to person, place, and time.  Skin: Skin is warm and dry.  Psychiatric: Memory, affect and judgment normal.  Nursing note and vitals reviewed.    Assessment & Plan  1. Annual physical  exam Patient is due for a mammogram in March 2017 and due for repeat colonoscopy in 2017.she will return for labs including CMP, FLP, and STD panel in November 2016. Patient verbalized understanding with the plan. - Cytology - PAP   Penny Arrambide [facility] 02/04/2015 10:31 AM

## 2015-02-18 ENCOUNTER — Encounter: Payer: Self-pay | Admitting: Family Medicine

## 2015-02-26 ENCOUNTER — Encounter: Payer: PPO | Admitting: Podiatry

## 2015-03-03 ENCOUNTER — Ambulatory Visit (INDEPENDENT_AMBULATORY_CARE_PROVIDER_SITE_OTHER): Payer: PPO

## 2015-03-03 ENCOUNTER — Ambulatory Visit (INDEPENDENT_AMBULATORY_CARE_PROVIDER_SITE_OTHER): Payer: Medicaid Other | Admitting: Podiatry

## 2015-03-03 ENCOUNTER — Encounter: Payer: Self-pay | Admitting: Podiatry

## 2015-03-03 VITALS — BP 159/84 | HR 68 | Resp 16

## 2015-03-03 DIAGNOSIS — L03012 Cellulitis of left finger: Secondary | ICD-10-CM

## 2015-03-03 DIAGNOSIS — L03032 Cellulitis of left toe: Secondary | ICD-10-CM | POA: Diagnosis not present

## 2015-03-03 DIAGNOSIS — S90122A Contusion of left lesser toe(s) without damage to nail, initial encounter: Secondary | ICD-10-CM

## 2015-03-03 DIAGNOSIS — S99922A Unspecified injury of left foot, initial encounter: Secondary | ICD-10-CM | POA: Diagnosis not present

## 2015-03-03 MED ORDER — NEOMYCIN-POLYMYXIN-HC 1 % OT SOLN: OTIC | Status: DC

## 2015-03-03 NOTE — Progress Notes (Signed)
She presents today with chief complaint of a painful hallux left. She states that she stubbed her left great toe at a friend's house bending the toe completely under. She states the toe has been throbbing and severely painful and she is only taking oral anti-inflammatory's. She states that she scratched her knee as she demonstrates her knee to me.  Objective: Vital signs are stable she is alert and oriented 3. No acute distress. Pulses are strongly palpable left. Neurologic sensorium is intact. Deep tendon reflexes are intact and muscle strength +5 over 5 dorsiflexors plantar flexors and inverters and everters. Hallux left demonstrates a floating nail plate with an abscess beneath the hallux nail left. Her surrounding erythema with no pain on medial and lateral compression of the distal phalanx. Radiographs taken in the office today do not demonstrate any type of osseus abnormality to the area. The toe does demonstrate edema.  Assessment: Subungual hematoma with abscess.  Plan: An incision and drainage was performed today with a total nail avulsion hallux left. She tolerated this procedure well after local anesthesia was administered and radiographs confirmed no fracture to the distal phalanx. She will start soaking twice daily Betadine and warm water and I will follow-up with her in 1 week.

## 2015-03-03 NOTE — Patient Instructions (Signed)

## 2015-03-12 ENCOUNTER — Encounter: Payer: Self-pay | Admitting: Podiatry

## 2015-03-12 ENCOUNTER — Encounter: Payer: PPO | Admitting: Podiatry

## 2015-03-12 NOTE — Patient Instructions (Signed)

## 2015-03-19 ENCOUNTER — Ambulatory Visit (INDEPENDENT_AMBULATORY_CARE_PROVIDER_SITE_OTHER): Payer: PPO | Admitting: Podiatry

## 2015-03-19 ENCOUNTER — Encounter: Payer: Self-pay | Admitting: Podiatry

## 2015-03-19 DIAGNOSIS — L03012 Cellulitis of left finger: Secondary | ICD-10-CM

## 2015-03-19 DIAGNOSIS — L03032 Cellulitis of left toe: Secondary | ICD-10-CM

## 2015-03-19 NOTE — Progress Notes (Signed)
She presents today for a nail avulsion hallux left. She states that seems to be doing well. Date of avulsion with 03/03/2015.  Objective: No erythema edema cellulitis drainage or odor to the hallux nailbed left.  Assessment: 1 nonsurgical toe.  Plan: Follow up with her couple of months if necessary.

## 2015-03-21 NOTE — Progress Notes (Signed)
This encounter was created in error - please disregard.

## 2015-04-07 ENCOUNTER — Ambulatory Visit: Payer: PPO | Admitting: Family Medicine

## 2015-04-09 ENCOUNTER — Telehealth: Payer: Self-pay | Admitting: Family Medicine

## 2015-04-09 MED ORDER — TIZANIDINE HCL 4 MG PO CAPS: 4.0000 mg | ORAL_CAPSULE | Freq: Three times a day (TID) | ORAL | Status: DC | PRN

## 2015-04-09 NOTE — Telephone Encounter (Signed)
Tizanidine HCL 4 MG Capsule has been refilled and sent to [facility] [address] per pharmacy request

## 2015-04-10 ENCOUNTER — Ambulatory Visit: Payer: PPO | Admitting: Family Medicine

## 2015-04-14 ENCOUNTER — Ambulatory Visit (INDEPENDENT_AMBULATORY_CARE_PROVIDER_SITE_OTHER): Payer: PPO | Admitting: Family Medicine

## 2015-04-14 ENCOUNTER — Encounter: Payer: Self-pay | Admitting: Family Medicine

## 2015-04-14 VITALS — BP 124/68 | HR 74 | Temp 98.5°F | Resp 16 | Ht 65.0 in | Wt 170.1 lb

## 2015-04-14 DIAGNOSIS — M542 Cervicalgia: Secondary | ICD-10-CM

## 2015-04-14 DIAGNOSIS — F418 Other specified anxiety disorders: Secondary | ICD-10-CM

## 2015-04-14 DIAGNOSIS — M5090 Cervical disc disorder, unspecified, unspecified cervical region: Secondary | ICD-10-CM | POA: Diagnosis not present

## 2015-04-14 DIAGNOSIS — F419 Anxiety disorder, unspecified: Secondary | ICD-10-CM

## 2015-04-14 DIAGNOSIS — F329 Major depressive disorder, single episode, unspecified: Secondary | ICD-10-CM

## 2015-04-14 DIAGNOSIS — G8929 Other chronic pain: Secondary | ICD-10-CM | POA: Diagnosis not present

## 2015-04-14 DIAGNOSIS — R030 Elevated blood-pressure reading, without diagnosis of hypertension: Secondary | ICD-10-CM

## 2015-04-14 DIAGNOSIS — IMO0001 Reserved for inherently not codable concepts without codable children: Secondary | ICD-10-CM

## 2015-04-14 DIAGNOSIS — F32A Depression, unspecified: Secondary | ICD-10-CM

## 2015-04-14 MED ORDER — LORAZEPAM 0.5 MG PO TABS: 0.5000 mg | ORAL_TABLET | Freq: Every day | ORAL | Status: DC

## 2015-04-14 MED ORDER — SERTRALINE HCL 50 MG PO TABS: 50.0000 mg | ORAL_TABLET | Freq: Every day | ORAL | Status: DC

## 2015-04-14 MED ORDER — HYDROCODONE-ACETAMINOPHEN 5-325 MG PO TABS: 1.0000 | ORAL_TABLET | Freq: Four times a day (QID) | ORAL | Status: DC | PRN

## 2015-04-14 MED ORDER — PREDNISONE 20 MG PO TABS: 20.0000 mg | ORAL_TABLET | Freq: Every day | ORAL | Status: DC

## 2015-04-15 ENCOUNTER — Encounter: Payer: Self-pay | Admitting: Family Medicine

## 2015-04-15 DIAGNOSIS — F17200 Nicotine dependence, unspecified, uncomplicated: Secondary | ICD-10-CM | POA: Insufficient documentation

## 2015-04-15 DIAGNOSIS — J4520 Mild intermittent asthma, uncomplicated: Secondary | ICD-10-CM | POA: Insufficient documentation

## 2015-04-15 DIAGNOSIS — M25569 Pain in unspecified knee: Secondary | ICD-10-CM | POA: Insufficient documentation

## 2015-04-15 DIAGNOSIS — H6980 Other specified disorders of Eustachian tube, unspecified ear: Secondary | ICD-10-CM | POA: Insufficient documentation

## 2015-04-15 DIAGNOSIS — G4730 Sleep apnea, unspecified: Secondary | ICD-10-CM | POA: Insufficient documentation

## 2015-04-15 DIAGNOSIS — M5090 Cervical disc disorder, unspecified, unspecified cervical region: Secondary | ICD-10-CM | POA: Insufficient documentation

## 2015-04-15 NOTE — Progress Notes (Signed)
Name: Janice Wilson   MRN: [id number]    DOB: 03-26-1956   Date:04/15/2015       Progress Note  Subjective  Chief Complaint  Chief Complaint  Patient presents with  . Hypertension    follow up elevated BP  . Shoulder Pain    right shoulder would like referral  . Anxiety    HPI   Patient is seen today on behalf Dr. Manuella Ghazi her usual provider because of her being pressed for time for this visit.  Problem cervical disc disease and spinal stenosis  Patient has a history her neurosurgeon Dr. Mallie Mussel in 2013 . She has cervical proximal C4-7. She now has been complaining of right shoulder shoulder area radiating her arm as well. She is currently on a regimen consisting of Zanaflex and over-the-counter anti-inflammatory agent. Despite these measures she continues to have severe pain in the right trapezius sternal mastoid area. She is having difficulty sleeping she is desiring a reevaluation by her neurosurgeon's time.  Anxiety disorder  Patient has a long-standing history of anxiety minute manifested NERVOUSNESS palpitations shortness of breath at times. She has a regimen of lorazepam this has been working adequately for her at this time.   Elevated blood pressure  Patient has a history of some elevation of her blood pressure. She has been Restricting to Salt Some Degree. She Is Not Currently taking an Into hypertensive agent regularly. Currently she is not exercising   Past Medical History  Diagnosis Date  . Asthma   . Shortness of breath   . Sleep apnea     uses CPAP machine sleep study 9/ 2012 done at sleep med in [place]  . Anxiety   . Headache(784.0)   . Glaucoma   . Vertigo   . Panic attack   . Depression     Social History  Substance Use Topics  . Smoking status: Former Smoker -- 0.25 packs/day for 21 years    Quit date: 02/19/2013  . Smokeless tobacco: Never Used  . Alcohol Use: Yes     Comment: 1-2 beer a month     Current outpatient prescriptions:  .   acetaminophen (TYLENOL) 325 MG tablet, Take 325 mg by mouth every 6 (six) hours as needed. For pain , Disp: , Rfl:  .  albuterol (PROVENTIL HFA;VENTOLIN HFA) 108 (90 BASE) MCG/ACT inhaler, Inhale 1 puff into the lungs every 4 (four) hours as needed. For shortness of breath, Disp: , Rfl:  .  aspirin 81 MG tablet, Take 81 mg by mouth daily., Disp: , Rfl:  .  carisoprodol (SOMA) 350 MG tablet, Take 350 mg by mouth 4 (four) times daily as needed for muscle spasms., Disp: , Rfl:  .  cyclobenzaprine (FLEXERIL) 10 MG tablet, Take 10 mg by mouth daily., Disp: , Rfl:  .  eszopiclone (LUNESTA) 2 MG TABS tablet, 1 PO HS PRN INSOMNIA, Disp: , Rfl:  .  fluticasone (FLONASE) 50 MCG/ACT nasal spray, Place 2 sprays into the nose daily as needed. FOR CONGESTION , Disp: , Rfl:  .  Fluticasone-Salmeterol (ADVAIR) 250-50 MCG/DOSE AEPB, Inhale 1 puff into the lungs every 12 (twelve) hours as needed. FOR WHEEZING , Disp: , Rfl:  .  gabapentin (NEURONTIN) 300 MG capsule, Take 1 capsule (300 mg total) by mouth 3 (three) times daily., Disp: 90 capsule, Rfl: 2 .  HYDROcodone-acetaminophen (NORCO) 5-325 MG tablet, Take 1 tablet by mouth every 6 (six) hours as needed for moderate pain., Disp: 30 tablet, Rfl: 0 .  ibuprofen (ADVIL,MOTRIN) 800 MG tablet, Take 800 mg by mouth daily as needed. FOR PAIN , Disp: , Rfl:  .  latanoprost (XALATAN) 0.005 % ophthalmic solution, Place 1 drop into both eyes at bedtime.  , Disp: , Rfl:  .  loratadine (CLARITIN) 10 MG tablet, Take 10 mg by mouth at bedtime., Disp: , Rfl:  .  LORazepam (ATIVAN) 0.5 MG tablet, Take 1 tablet (0.5 mg total) by mouth at bedtime., Disp: 30 tablet, Rfl: 2 .  meclizine (ANTIVERT) 25 MG tablet, Take 25 mg by mouth 3 (three) times daily as needed. For vertigo  , Disp: , Rfl:  .  Melatonin 1 MG CAPS, Take 1 capsule by mouth daily., Disp: , Rfl:  .  meloxicam (MOBIC) 15 MG tablet, Take 1 tablet (15 mg total) by mouth daily., Disp: 30 tablet, Rfl: 2 .  Multiple  Vitamins-Minerals (MULTIPLE VITAMINS/WOMENS PO), Take 1 tablet by mouth daily., Disp: , Rfl:  .  NEOMYCIN-POLYMYXIN-HYDROCORTISONE (CORTISPORIN) 1 % SOLN otic solution, Apply 1-2 drops to toe BID after soaking, Disp: 10 mL, Rfl: 1 .  predniSONE (DELTASONE) 20 MG tablet, Take 1 tablet (20 mg total) by mouth daily with breakfast., Disp: 10 tablet, Rfl: 0 .  sertraline (ZOLOFT) 50 MG tablet, Take 1 tablet (50 mg total) by mouth at bedtime., Disp: 30 tablet, Rfl: 2 .  timolol (BETIMOL) 0.5 % ophthalmic solution, Place 1 drop into both eyes every morning.  , Disp: , Rfl:  .  tiZANidine (ZANAFLEX) 4 MG capsule, Take 1 capsule (4 mg total) by mouth every 8 (eight) hours as needed for muscle spasms., Disp: 40 capsule, Rfl: 1  Allergies  Allergen Reactions  . Oxycodone Hcl Other (See Comments)    REACTION:"MAKES ME PALE"  . Propoxyphene Other (See Comments)  . Propoxyphene     Review of Systems  Constitutional: Negative for fever, chills and weight loss.  HENT: Negative for congestion, hearing loss, sore throat and tinnitus.   Eyes: Negative for blurred vision, double vision and redness.  Respiratory: Negative for cough, hemoptysis and shortness of breath.   Cardiovascular: Negative for chest pain, palpitations, orthopnea, claudication and leg swelling.  Gastrointestinal: Negative for heartburn, nausea, vomiting, diarrhea, constipation and blood in stool.  Genitourinary: Negative for dysuria, urgency, frequency and hematuria.  Musculoskeletal: Positive for back pain and neck pain. Negative for myalgias, joint pain and falls.  Skin: Negative for itching.  Neurological: Negative for dizziness, tingling, tremors, focal weakness, seizures, loss of consciousness, weakness and headaches.  Endo/Heme/Allergies: Does not bruise/bleed easily.  Psychiatric/Behavioral: Positive for depression. Negative for substance abuse. The patient is nervous/anxious. The patient does not have insomnia.       Objective  Filed Vitals:   04/14/15 1525  BP: 124/68  Pulse: 74  Temp: 98.5 F (36.9 C)  TempSrc: Oral  Resp: 16  Height: 5\' 5"  (1.651 m)  Weight: 170 lb 1.6 oz (77.157 kg)  SpO2: 95%     Physical Exam  Constitutional: She is oriented to person, place, and time and well-developed, well-nourished, and in no distress.  HENT:  Head: Normocephalic.  Eyes: EOM are normal. Pupils are equal, round, and reactive to light.  Neck: No thyromegaly present.  Is marked tenderness to palpation over the right sternocleidomastoid trapezius and right subscapular.  Cardiovascular: Normal rate, regular rhythm and normal heart sounds.   No murmur heard. Pulmonary/Chest: Effort normal and breath sounds normal.  Musculoskeletal: She exhibits no edema.  The cervical spine is limited to 30 of rotation  to the right spasm the subscapular musculature. Lateral rotation of the C-spine to the left is limited to 60. Flexion and extension also to about 60. DTRs are diminished.  Neurological: She is alert and oriented to person, place, and time. No cranial nerve deficit. Gait normal.  Skin: Skin is warm and dry. No rash noted.  Psychiatric: Memory normal.  Patient is quite anxious and loquacious.      Assessment & Plan  1. Cervical disc disease She's already had cervical fusion and also has cervical spinal stenosis. - Ambulatory referral to Neurosurgery - predniSONE (DELTASONE) 20 MG tablet; Take 1 tablet (20 mg total) by mouth daily with breakfast.  Dispense: 10 tablet; Refill: 0 - HYDROcodone-acetaminophen (NORCO) 5-325 MG tablet; Take 1 tablet by mouth every 6 (six) hours as needed for moderate pain.  Dispense: 30 tablet; Refill: 0  2. Anxiety and depression Continue her lorazepam - LORazepam (ATIVAN) 0.5 MG tablet; Take 1 tablet (0.5 mg total) by mouth at bedtime.  Dispense: 30 tablet; Refill: 2 - sertraline (ZOLOFT) 50 MG tablet; Take 1 tablet (50 mg total) by mouth at bedtime.   Dispense: 30 tablet; Refill: 2  3. Chronic cervical pain As above  4. Anxiety As above  5. Elevated blood pressure Continue salt restriction and exercise and weight loss.

## 2015-04-23 ENCOUNTER — Ambulatory Visit: Payer: PPO | Admitting: Podiatry

## 2015-05-26 ENCOUNTER — Encounter (INDEPENDENT_AMBULATORY_CARE_PROVIDER_SITE_OTHER): Payer: PPO | Admitting: Podiatry

## 2015-05-26 NOTE — Progress Notes (Signed)
This encounter was created in error - please disregard.

## 2015-06-17 ENCOUNTER — Encounter: Payer: Self-pay | Admitting: Family Medicine

## 2015-06-17 ENCOUNTER — Ambulatory Visit (INDEPENDENT_AMBULATORY_CARE_PROVIDER_SITE_OTHER): Payer: PPO | Admitting: Family Medicine

## 2015-06-17 VITALS — BP 140/84 | HR 64 | Temp 98.7°F | Resp 16 | Ht 65.0 in | Wt 177.4 lb

## 2015-06-17 DIAGNOSIS — E663 Overweight: Secondary | ICD-10-CM

## 2015-06-17 DIAGNOSIS — R221 Localized swelling, mass and lump, neck: Secondary | ICD-10-CM | POA: Diagnosis not present

## 2015-06-17 DIAGNOSIS — M5090 Cervical disc disorder, unspecified, unspecified cervical region: Secondary | ICD-10-CM

## 2015-06-17 NOTE — Progress Notes (Signed)
Name: Janice Wilson   MRN: [id number]    DOB: [birth year]   Date:06/17/2015       Progress Note  Subjective  Chief Complaint  Chief Complaint  Patient presents with  . Shoulder Pain    right onset 2 months radiates all the way down to leg  . Obesity    wants to see about being placed on weight loss med  . Cyst    left side face onset 3 months and increasing in size    Neck Pain  This is a recurrent problem. The problem occurs daily. The pain is present in the midline. The quality of the pain is described as aching. The pain is at a severity of 8/10. She has tried muscle relaxants and NSAIDs for the symptoms.  Wants to be referred to a specialist for her persistent and recurrent neck pain radiating down to her right shoulder. Was referred to Neurosurgery by Dr. Rutherford Nail but claims that no one has called her from that office.. Obesity Pt. Is overweight (current BMI 29.52 kg/m2), wants to lose weight. Pt.is here for evaluation of left sided neck mass, present for over 1 year. Seen ENT in the past. The mass has grown recently (last 3 months).   Past Medical History  Diagnosis Date  . Asthma   . Shortness of breath   . Sleep apnea     uses CPAP machine sleep study 9/ 2012 done at sleep med in [place]  . Anxiety   . Headache(784.0)   . Glaucoma   . Vertigo   . Panic attack   . Depression     Past Surgical History  Procedure Laterality Date  . Partial hysterectomy    . Cesarean section    . Anterior cervical decomp/discectomy fusion  07/05/2011    Procedure: ANTERIOR CERVICAL DECOMPRESSION/DISCECTOMY FUSION 3 LEVELS;  Surgeon: Cooper Render Pool;  Location: [facility];  Service: Neurosurgery;  Laterality: N/A;  Cervical Four-Five, Cervical Five-Six, Cervical Six-Seven Anterior Cervical Decompression Fusion WITH ALLOGRAFT AND PLATING    Family History  Problem Relation Age of Onset  . Anesthesia problems Neg Hx   . Cancer Mother   . Stroke Father     Social History    Social History  . Marital Status: Single    Spouse Name: N/A  . Number of Children: N/A  . Years of Education: N/A   Occupational History  . Not on file.   Social History Main Topics  . Smoking status: Former Smoker -- 0.25 packs/day for 21 years    Quit date: 02/19/2013  . Smokeless tobacco: Never Used  . Alcohol Use: Yes     Comment: 1-2 beer a month  . Drug Use: No  . Sexual Activity: Not on file   Other Topics Concern  . Not on file   Social History Narrative     Current outpatient prescriptions:  .  acetaminophen (TYLENOL) 325 MG tablet, Take 325 mg by mouth every 6 (six) hours as needed. For pain , Disp: , Rfl:  .  albuterol (PROVENTIL HFA;VENTOLIN HFA) 108 (90 BASE) MCG/ACT inhaler, Inhale 1 puff into the lungs every 4 (four) hours as needed. For shortness of breath, Disp: , Rfl:  .  aspirin 81 MG tablet, Take 81 mg by mouth daily., Disp: , Rfl:  .  carisoprodol (SOMA) 350 MG tablet, Take 350 mg by mouth 4 (four) times daily as needed for muscle spasms., Disp: , Rfl:  .  cyclobenzaprine (FLEXERIL) 10 MG  tablet, Take 10 mg by mouth daily., Disp: , Rfl:  .  eszopiclone (LUNESTA) 2 MG TABS tablet, 1 PO HS PRN INSOMNIA, Disp: , Rfl:  .  fluticasone (FLONASE) 50 MCG/ACT nasal spray, Place 2 sprays into the nose daily as needed. FOR CONGESTION , Disp: , Rfl:  .  Fluticasone-Salmeterol (ADVAIR) 250-50 MCG/DOSE AEPB, Inhale 1 puff into the lungs every 12 (twelve) hours as needed. FOR WHEEZING , Disp: , Rfl:  .  gabapentin (NEURONTIN) 300 MG capsule, Take 1 capsule (300 mg total) by mouth 3 (three) times daily., Disp: 90 capsule, Rfl: 2 .  HYDROcodone-acetaminophen (NORCO) 5-325 MG tablet, Take 1 tablet by mouth every 6 (six) hours as needed for moderate pain., Disp: 30 tablet, Rfl: 0 .  ibuprofen (ADVIL,MOTRIN) 800 MG tablet, Take 800 mg by mouth daily as needed. FOR PAIN , Disp: , Rfl:  .  latanoprost (XALATAN) 0.005 % ophthalmic solution, Place 1 drop into both eyes at  bedtime.  , Disp: , Rfl:  .  loratadine (CLARITIN) 10 MG tablet, Take 10 mg by mouth at bedtime., Disp: , Rfl:  .  LORazepam (ATIVAN) 0.5 MG tablet, Take 1 tablet (0.5 mg total) by mouth at bedtime., Disp: 30 tablet, Rfl: 2 .  meclizine (ANTIVERT) 25 MG tablet, Take 25 mg by mouth 3 (three) times daily as needed. For vertigo  , Disp: , Rfl:  .  Melatonin 1 MG CAPS, Take 1 capsule by mouth daily., Disp: , Rfl:  .  meloxicam (MOBIC) 15 MG tablet, Take 1 tablet (15 mg total) by mouth daily., Disp: 30 tablet, Rfl: 2 .  Multiple Vitamins-Minerals (MULTIPLE VITAMINS/WOMENS PO), Take 1 tablet by mouth daily., Disp: , Rfl:  .  NEOMYCIN-POLYMYXIN-HYDROCORTISONE (CORTISPORIN) 1 % SOLN otic solution, Apply 1-2 drops to toe BID after soaking, Disp: 10 mL, Rfl: 1 .  predniSONE (DELTASONE) 20 MG tablet, Take 1 tablet (20 mg total) by mouth daily with breakfast., Disp: 10 tablet, Rfl: 0 .  sertraline (ZOLOFT) 50 MG tablet, Take 1 tablet (50 mg total) by mouth at bedtime., Disp: 30 tablet, Rfl: 2 .  timolol (BETIMOL) 0.5 % ophthalmic solution, Place 1 drop into both eyes every morning.  , Disp: , Rfl:  .  tiZANidine (ZANAFLEX) 4 MG capsule, Take 1 capsule (4 mg total) by mouth every 8 (eight) hours as needed for muscle spasms., Disp: 40 capsule, Rfl: 1  Allergies  Allergen Reactions  . Oxycodone Hcl Other (See Comments)    REACTION:"MAKES ME PALE"  . Propoxyphene Other (See Comments)  . Propoxyphene     Review of Systems  Musculoskeletal: Positive for joint pain and neck pain.    Objective  Filed Vitals:   06/17/15 0959  BP: 140/84  Pulse: 64  Temp: 98.7 F (37.1 C)  TempSrc: Oral  Resp: 16  Height: 5\' 5"  (1.651 m)  Weight: 177 lb 6.4 oz (80.468 kg)  SpO2: 94%    Physical Exam  Constitutional: She is oriented to person, place, and time and well-developed, well-nourished, and in no distress.  Neck:    Non-tender nodular mass on left anterior neck.  Cardiovascular: Normal rate and regular  rhythm.   Pulmonary/Chest: Effort normal and breath sounds normal.  Musculoskeletal:       Cervical back: She exhibits tenderness, pain and spasm.       Back:  Neurological: She is alert and oriented to person, place, and time.  Nursing note and vitals reviewed.   Assessment & Plan  1. Overweight (  BMI 25.0-29.9) Discussed weight loss by dietary and lifestyle measures. We'll refer to [facility] for initial assessment. - Amb ref to Medical Nutrition Therapy-MNT  2. Mass of left side of neck Referral to ENT for assessment of left sided neck mass with change in size - Ambulatory referral to ENT  3. Cervical disc disease Confirm with the referral staff about referral to neurosurgery which was placed on 04/15/15.  Trinette Vera [facility] 06/17/2015 10:14 AM

## 2015-07-03 ENCOUNTER — Encounter: Payer: PPO | Attending: Family Medicine | Admitting: Dietician

## 2015-07-03 ENCOUNTER — Encounter: Payer: Self-pay | Admitting: Dietician

## 2015-07-03 VITALS — Ht 65.0 in | Wt 174.0 lb

## 2015-07-03 DIAGNOSIS — E663 Overweight: Secondary | ICD-10-CM | POA: Insufficient documentation

## 2015-07-03 NOTE — Patient Instructions (Signed)
   Keep starch portions small, 1 cup or less. If you eat 1/3 - 1/2 cup of a starchy food with your meal, you can add a serving of fruit or a bread.   Track what and how much you eat with My[employer]itnessPal. You can also track your exercise, and it will increase your calorie allowance on days you exercise.   Keep up your healthy food choices!

## 2015-07-03 NOTE — Progress Notes (Signed)
Medical Nutrition Therapy: Visit start time: [id number]  end time: 1140  Assessment:  Diagnosis: overweight Past medical history: hyperlipidemia, reports fluctuating BP, sleep apnea, disability due to back Psychosocial issues/ stress concerns: Patient reports moderate stress level; feels she is not dealing well with her stresses. Preferred learning method:  . Auditory . Visual . Hands-on  Current weight: 174lbs  Height: 5'5" Medications, supplements: reviewed list in chart with patient  Progress and evaluation: Patient participated in [place] last year with minimal weight loss.          Has gained some additional weight since then.          Has recently made diet changes to reduce snack foods and eating frequency        She is hopeful that weight loss will help ease pain in her back.    Physical activity: no regular exercise, some crunches occasionally  Dietary Intake:  Usual eating pattern includes 3 meals and 3 snacks per day. Dining out frequency: 2-3 meals per week.  Breakfast: 8am boiled egg, coffee; sometimes cereal, egg, bacon, toast or pancakes on weekends Snack: cherry tomatoes, carrots with Ranch dressing Lunch: [place] chef's salad or deli chicken sandwich Snack: yogurt or apple/orange, occasionally ice cream or pie Supper: daughter cooks (also trying to lose weight); limits red meats. Baked, bbq chicken, fish shrimp baked or sauteed. Peas, mac and cheese. Bread Snack: 9-10pm yogurt and apple or orange.  Beverages: 97oz water daily  Nutrition Care Education: Topics covered: weight management Basic nutrition: basic food groups, appropriate nutrient balance, appropriate meal and snack schedule, general nutrition guidelines    Weight control: 1300kcal meal plan for weight loss based on low activity; discussed possible need to increase kcal intake if she is able to increase physical activity.      Used plate method for meal planning and food models to illustrate appropriate  food portions; advised tracking food intake. Advanced nutrition:  food label reading Other lifestyle changes:  Options for physical activity such as doing short duration increments to minimize pain.   Nutritional Diagnosis:  [place]-3.3 Overweight/obesity As related to limited physical activity/mobility, history of excess caloric intake.  As evidenced by patient report.  Intervention: Instruction as noted above.   Set goals with patient input.   Commended patient for healthy food choices she is currently making.     Education Materials given:  . Food lists/ Planning A Balanced Meal . Sample meal pattern/ menus . Snacking handout . Goals/ instructions  Learner/ who was taught:  . Patient    Level of understanding: Marland Kitchen Verbalizes/ demonstrates competency  Demonstrated degree of understanding via:   Teach back Learning barriers: . None   Willingness to learn/ readiness for change: . Eager, change in progress   Monitoring and Evaluation:  Dietary intake, exercise, and body weight      follow up: 08/07/15

## 2015-07-17 ENCOUNTER — Encounter: Payer: Self-pay | Admitting: Dietician

## 2015-07-22 ENCOUNTER — Ambulatory Visit: Payer: PPO | Admitting: Family Medicine

## 2015-07-24 DIAGNOSIS — L538 Other specified erythematous conditions: Secondary | ICD-10-CM | POA: Diagnosis not present

## 2015-07-24 DIAGNOSIS — L821 Other seborrheic keratosis: Secondary | ICD-10-CM | POA: Diagnosis not present

## 2015-07-24 DIAGNOSIS — L728 Other follicular cysts of the skin and subcutaneous tissue: Secondary | ICD-10-CM | POA: Diagnosis not present

## 2015-07-24 DIAGNOSIS — L298 Other pruritus: Secondary | ICD-10-CM | POA: Diagnosis not present

## 2015-07-24 DIAGNOSIS — L02222 Furuncle of back [any part, except buttock]: Secondary | ICD-10-CM | POA: Diagnosis not present

## 2015-07-24 DIAGNOSIS — L820 Inflamed seborrheic keratosis: Secondary | ICD-10-CM | POA: Diagnosis not present

## 2015-08-07 ENCOUNTER — Ambulatory Visit: Payer: PPO | Admitting: Dietician

## 2015-08-10 ENCOUNTER — Other Ambulatory Visit: Payer: Self-pay | Admitting: Family Medicine

## 2015-08-11 NOTE — Telephone Encounter (Signed)
Please check on refills

## 2015-08-12 DIAGNOSIS — K219 Gastro-esophageal reflux disease without esophagitis: Secondary | ICD-10-CM | POA: Diagnosis not present

## 2015-08-12 DIAGNOSIS — G4700 Insomnia, unspecified: Secondary | ICD-10-CM | POA: Diagnosis not present

## 2015-08-12 DIAGNOSIS — Z01419 Encounter for gynecological examination (general) (routine) without abnormal findings: Secondary | ICD-10-CM | POA: Diagnosis not present

## 2015-08-12 DIAGNOSIS — Z114 Encounter for screening for human immunodeficiency virus [HIV]: Secondary | ICD-10-CM | POA: Diagnosis not present

## 2015-08-12 DIAGNOSIS — Z124 Encounter for screening for malignant neoplasm of cervix: Secondary | ICD-10-CM | POA: Diagnosis not present

## 2015-08-12 DIAGNOSIS — Z1211 Encounter for screening for malignant neoplasm of colon: Secondary | ICD-10-CM | POA: Diagnosis not present

## 2015-08-12 DIAGNOSIS — J45909 Unspecified asthma, uncomplicated: Secondary | ICD-10-CM | POA: Diagnosis not present

## 2015-08-12 DIAGNOSIS — M2740 Unspecified cyst of jaw: Secondary | ICD-10-CM | POA: Diagnosis not present

## 2015-08-13 ENCOUNTER — Other Ambulatory Visit: Payer: Self-pay | Admitting: Obstetrics and Gynecology

## 2015-08-13 DIAGNOSIS — N6310 Unspecified lump in the right breast, unspecified quadrant: Secondary | ICD-10-CM

## 2015-08-15 ENCOUNTER — Encounter: Payer: Self-pay | Admitting: Dietician

## 2015-08-15 NOTE — Progress Notes (Signed)
Called patient to reschedule appointment which was missed on 08/07/15. She stated she is doing well and does not need to return at this time. Sent discharge letter to MD.

## 2015-08-26 ENCOUNTER — Ambulatory Visit
Admission: RE | Admit: 2015-08-26 | Discharge: 2015-08-26 | Disposition: A | Discharge status: Home / Self Care | Payer: PPO | Source: Ambulatory Visit | Attending: Obstetrics and Gynecology | Admitting: Obstetrics and Gynecology

## 2015-08-26 DIAGNOSIS — N6310 Unspecified lump in the right breast, unspecified quadrant: Secondary | ICD-10-CM

## 2015-08-26 DIAGNOSIS — R928 Other abnormal and inconclusive findings on diagnostic imaging of breast: Secondary | ICD-10-CM | POA: Insufficient documentation

## 2015-08-26 DIAGNOSIS — N63 Unspecified lump in breast: Secondary | ICD-10-CM | POA: Diagnosis not present

## 2015-08-26 DIAGNOSIS — N6489 Other specified disorders of breast: Secondary | ICD-10-CM | POA: Diagnosis not present

## 2015-09-03 DIAGNOSIS — L720 Epidermal cyst: Secondary | ICD-10-CM | POA: Diagnosis not present

## 2015-09-15 ENCOUNTER — Ambulatory Visit (INDEPENDENT_AMBULATORY_CARE_PROVIDER_SITE_OTHER): Payer: PPO | Admitting: Family Medicine

## 2015-09-15 ENCOUNTER — Encounter: Payer: Self-pay | Admitting: Family Medicine

## 2015-09-15 VITALS — BP 142/88 | HR 66 | Temp 98.3°F | Resp 18 | Ht 65.0 in | Wt 168.1 lb

## 2015-09-15 DIAGNOSIS — Z1211 Encounter for screening for malignant neoplasm of colon: Secondary | ICD-10-CM | POA: Diagnosis not present

## 2015-09-15 DIAGNOSIS — E785 Hyperlipidemia, unspecified: Secondary | ICD-10-CM | POA: Diagnosis not present

## 2015-09-15 MED ORDER — ROSUVASTATIN CALCIUM 5 MG PO TABS: 5.0000 mg | ORAL_TABLET | Freq: Every day | ORAL | Status: DC

## 2015-09-15 NOTE — Progress Notes (Signed)
Name: Janice Wilson   MRN: [id number]    DOB: [birth year]   Date:09/15/2015       Progress Note  Subjective  Chief Complaint  Chief Complaint  Patient presents with  . discuss labs    HPI  Hyperlipidemia: Elevated total cholesterol and LDL cholesterol. Labs drawn at [facility] office in [facility].   Past Medical History  Diagnosis Date  . Asthma   . Shortness of breath   . Sleep apnea     uses CPAP machine sleep study 9/ 2012 done at sleep med in [place]  . Anxiety   . Headache(784.0)   . Glaucoma   . Vertigo   . Panic attack   . Depression     Past Surgical History  Procedure Laterality Date  . Partial hysterectomy    . Cesarean section    . Anterior cervical decomp/discectomy fusion  07/05/2011    Procedure: ANTERIOR CERVICAL DECOMPRESSION/DISCECTOMY FUSION 3 LEVELS;  Surgeon: Cooper Render Pool;  Location: [facility];  Service: Neurosurgery;  Laterality: N/A;  Cervical Four-Five, Cervical Five-Six, Cervical Six-Seven Anterior Cervical Decompression Fusion WITH ALLOGRAFT AND PLATING    Family History  Problem Relation Age of Onset  . Anesthesia problems Neg Hx   . Breast cancer Neg Hx   . Cancer Mother   . Stroke Father     Social History   Social History  . Marital Status: Single    Spouse Name: N/A  . Number of Children: N/A  . Years of Education: N/A   Occupational History  . Not on file.   Social History Main Topics  . Smoking status: Current Some Day Smoker -- 0.20 packs/day for 21 years    Types: Cigarettes    Last Attempt to Quit: 02/19/2013  . Smokeless tobacco: Never Used  . Alcohol Use: 0.0 oz/week    0 Standard drinks or equivalent per week     Comment: 1-2 beer a month  . Drug Use: No  . Sexual Activity: Not on file   Other Topics Concern  . Not on file   Social History Narrative     Current outpatient prescriptions:  .  conjugated estrogens (PREMARIN) vaginal cream, Place vaginally., Disp: , Rfl:  .  montelukast  (SINGULAIR) 10 MG tablet, Take by mouth., Disp: , Rfl:  .  acetaminophen (TYLENOL) 325 MG tablet, Take 325 mg by mouth every 6 (six) hours as needed. For pain , Disp: , Rfl:  .  albuterol (PROVENTIL HFA;VENTOLIN HFA) 108 (90 BASE) MCG/ACT inhaler, Inhale 1 puff into the lungs every 4 (four) hours as needed. For shortness of breath, Disp: , Rfl:  .  aspirin 81 MG tablet, Take 81 mg by mouth daily., Disp: , Rfl:  .  carisoprodol (SOMA) 350 MG tablet, Take 350 mg by mouth 4 (four) times daily as needed for muscle spasms., Disp: , Rfl:  .  cyclobenzaprine (FLEXERIL) 10 MG tablet, Take 10 mg by mouth daily., Disp: , Rfl:  .  eszopiclone (LUNESTA) 2 MG TABS tablet, Reported on 07/03/2015, Disp: , Rfl:  .  fluticasone (FLONASE) 50 MCG/ACT nasal spray, Place 2 sprays into the nose daily as needed. FOR CONGESTION , Disp: , Rfl:  .  Fluticasone-Salmeterol (ADVAIR) 250-50 MCG/DOSE AEPB, Inhale 1 puff into the lungs every 12 (twelve) hours as needed. FOR WHEEZING , Disp: , Rfl:  .  gabapentin (NEURONTIN) 300 MG capsule, Take 1 capsule (300 mg total) by mouth 3 (three) times daily., Disp: 90 capsule, Rfl:  2 .  ibuprofen (ADVIL,MOTRIN) 800 MG tablet, Take 800 mg by mouth daily as needed. FOR PAIN , Disp: , Rfl:  .  latanoprost (XALATAN) 0.005 % ophthalmic solution, Place 1 drop into both eyes at bedtime.  , Disp: , Rfl:  .  loratadine (CLARITIN) 10 MG tablet, Take 10 mg by mouth at bedtime., Disp: , Rfl:  .  LORazepam (ATIVAN) 0.5 MG tablet, TAKE 1 TABLET BY MOUTH AT BEDTIME, Disp: 30 tablet, Rfl: 2 .  meclizine (ANTIVERT) 25 MG tablet, Take 25 mg by mouth 3 (three) times daily as needed. Reported on 07/03/2015, Disp: , Rfl:  .  Melatonin 1 MG CAPS, Take 1 capsule by mouth daily. Reported on 07/03/2015, Disp: , Rfl:  .  meloxicam (MOBIC) 15 MG tablet, Take 1 tablet (15 mg total) by mouth daily., Disp: 30 tablet, Rfl: 2 .  Multiple Vitamins-Minerals (MULTIPLE VITAMINS/WOMENS PO), Take 1 tablet by mouth daily., Disp:  , Rfl:  .  NEOMYCIN-POLYMYXIN-HYDROCORTISONE (CORTISPORIN) 1 % SOLN otic solution, Apply 1-2 drops to toe BID after soaking, Disp: 10 mL, Rfl: 1 .  sertraline (ZOLOFT) 50 MG tablet, TAKE 1 TABLET BY MOUTH AT BEDTIME, Disp: 30 tablet, Rfl: 2 .  timolol (BETIMOL) 0.5 % ophthalmic solution, Place 1 drop into both eyes every morning.  , Disp: , Rfl:  .  tiZANidine (ZANAFLEX) 4 MG capsule, Take 1 capsule (4 mg total) by mouth every 8 (eight) hours as needed for muscle spasms., Disp: 40 capsule, Rfl: 1  Allergies  Allergen Reactions  . Oxycodone Hcl Other (See Comments)    REACTION:"MAKES ME PALE"  . Propoxyphene Other (See Comments)  . Propoxyphene      Review of Systems  Respiratory: Negative for cough.   Cardiovascular: Negative for chest pain.  Gastrointestinal: Negative for abdominal pain.    Objective  Filed Vitals:   09/15/15 0925  BP: 142/88  Pulse: 66  Temp: 98.3 F (36.8 C)  Resp: 18  Height: 5\' 5"  (1.651 m)  Weight: 168 lb 1 oz (76.233 kg)  SpO2: 96%    Physical Exam  Constitutional: She is oriented to person, place, and time and well-developed, well-nourished, and in no distress.  HENT:  Head: Normocephalic and atraumatic.  Cardiovascular: Normal rate and regular rhythm.   Pulmonary/Chest: Effort normal and breath sounds normal.  Abdominal: Soft. Bowel sounds are normal.  Neurological: She is alert and oriented to person, place, and time.  Nursing note and vitals reviewed.    Assessment & Plan  1. Hyperlipidemia Elevated total and LDL cholesterol, reviewed and discussed with patient in detail. We'll start on Crestor 5 mg at bedtime. Patient educated on side effects of statin therapy. Recheck lipids in 3-4 months. - rosuvastatin (CRESTOR) 5 MG tablet; Take 1 tablet (5 mg total) by mouth at bedtime.  Dispense: 90 tablet; Refill: 0   2. Encounter for screening colonoscopy  - Ambulatory referral to Gastroenterology   Kassandra Meriweather [facility] 09/15/2015 9:45 AM

## 2015-09-16 DIAGNOSIS — R05 Cough: Secondary | ICD-10-CM | POA: Diagnosis not present

## 2015-09-16 DIAGNOSIS — J189 Pneumonia, unspecified organism: Secondary | ICD-10-CM | POA: Diagnosis not present

## 2015-09-16 DIAGNOSIS — M791 Myalgia: Secondary | ICD-10-CM | POA: Diagnosis not present

## 2015-09-18 ENCOUNTER — Ambulatory Visit: Payer: PPO | Admitting: Family Medicine

## 2015-09-25 DIAGNOSIS — D485 Neoplasm of uncertain behavior of skin: Secondary | ICD-10-CM | POA: Diagnosis not present

## 2015-09-25 DIAGNOSIS — L538 Other specified erythematous conditions: Secondary | ICD-10-CM | POA: Diagnosis not present

## 2015-09-25 DIAGNOSIS — B079 Viral wart, unspecified: Secondary | ICD-10-CM | POA: Diagnosis not present

## 2015-09-30 ENCOUNTER — Ambulatory Visit: Payer: PPO | Admitting: Family Medicine

## 2015-09-30 DIAGNOSIS — H401131 Primary open-angle glaucoma, bilateral, mild stage: Secondary | ICD-10-CM | POA: Diagnosis not present

## 2015-10-10 ENCOUNTER — Telehealth: Payer: Self-pay

## 2015-10-10 ENCOUNTER — Other Ambulatory Visit: Payer: Self-pay

## 2015-10-10 NOTE — Telephone Encounter (Signed)
Gastroenterology Pre-Procedure Review  Request Date: 01/02/16 Requesting Physician: Dr. Manuella Ghazi   PATIENT REVIEW QUESTIONS: The patient responded to the following health history questions as indicated:    1. Are you having any GI issues? no 2. Do you have a personal history of Polyps? yes (Hx of colon polyps ) 3. Do you have a family history of Colon Cancer or Polyps? no 4. Diabetes Mellitus? no 5. Joint replacements in the past 12 months?no 6. Major health problems in the past 3 months?no 7. Any artificial heart valves, MVP, or defibrillator?no    MEDICATIONS & ALLERGIES:    Patient reports the following regarding taking any anticoagulation/antiplatelet therapy:   Plavix, Coumadin, Eliquis, Xarelto, Lovenox, Pradaxa, Brilinta, or Effient? no Aspirin? yes (ASA 81mg )  Patient confirms/reports the following medications:  Current Outpatient Prescriptions  Medication Sig Dispense Refill  . acetaminophen (TYLENOL) 325 MG tablet Take 325 mg by mouth every 6 (six) hours as needed. For pain     . albuterol (PROVENTIL HFA;VENTOLIN HFA) 108 (90 BASE) MCG/ACT inhaler Inhale 1 puff into the lungs every 4 (four) hours as needed. For shortness of breath    . aspirin 81 MG tablet Take 81 mg by mouth daily.    . carisoprodol (SOMA) 350 MG tablet Take 350 mg by mouth 4 (four) times daily as needed for muscle spasms.    Marland Kitchen conjugated estrogens (PREMARIN) vaginal cream Place vaginally.    . cyclobenzaprine (FLEXERIL) 10 MG tablet Take 10 mg by mouth daily.    . eszopiclone (LUNESTA) 2 MG TABS tablet Reported on 07/03/2015    . fluticasone (FLONASE) 50 MCG/ACT nasal spray Place 2 sprays into the nose daily as needed. FOR CONGESTION     . Fluticasone-Salmeterol (ADVAIR) 250-50 MCG/DOSE AEPB Inhale 1 puff into the lungs every 12 (twelve) hours as needed. FOR WHEEZING     . gabapentin (NEURONTIN) 300 MG capsule Take 1 capsule (300 mg total) by mouth 3 (three) times daily. 90 capsule 2  . ibuprofen (ADVIL,MOTRIN)  800 MG tablet Take 800 mg by mouth daily as needed. FOR PAIN     . latanoprost (XALATAN) 0.005 % ophthalmic solution Place 1 drop into both eyes at bedtime.      Marland Kitchen loratadine (CLARITIN) 10 MG tablet Take 10 mg by mouth at bedtime.    Marland Kitchen LORazepam (ATIVAN) 0.5 MG tablet TAKE 1 TABLET BY MOUTH AT BEDTIME 30 tablet 2  . meclizine (ANTIVERT) 25 MG tablet Take 25 mg by mouth 3 (three) times daily as needed. Reported on 07/03/2015    . Melatonin 1 MG CAPS Take 1 capsule by mouth daily. Reported on 07/03/2015    . meloxicam (MOBIC) 15 MG tablet Take 1 tablet (15 mg total) by mouth daily. 30 tablet 2  . montelukast (SINGULAIR) 10 MG tablet Take by mouth.    . Multiple Vitamins-Minerals (MULTIPLE VITAMINS/WOMENS PO) Take 1 tablet by mouth daily.    . NEOMYCIN-POLYMYXIN-HYDROCORTISONE (CORTISPORIN) 1 % SOLN otic solution Apply 1-2 drops to toe BID after soaking 10 mL 1  . rosuvastatin (CRESTOR) 5 MG tablet Take 1 tablet (5 mg total) by mouth at bedtime. 90 tablet 0  . sertraline (ZOLOFT) 50 MG tablet TAKE 1 TABLET BY MOUTH AT BEDTIME 30 tablet 2  . timolol (BETIMOL) 0.5 % ophthalmic solution Place 1 drop into both eyes every morning.      Marland Kitchen tiZANidine (ZANAFLEX) 4 MG capsule Take 1 capsule (4 mg total) by mouth every 8 (eight) hours as needed for muscle  spasms. 40 capsule 1   No current facility-administered medications for this visit.    Patient confirms/reports the following allergies:  Allergies  Allergen Reactions  . Oxycodone Hcl Other (See Comments)    REACTION:"MAKES ME PALE"  . Propoxyphene Other (See Comments)  . Propoxyphene     No orders of the defined types were placed in this encounter.    AUTHORIZATION INFORMATION Primary Insurance: 1D#: Group #:  Secondary Insurance: 1D#: Group #:  SCHEDULE INFORMATION: Date: 01/02/16 Time: Location: [facility]

## 2015-10-17 ENCOUNTER — Other Ambulatory Visit: Payer: Self-pay | Admitting: Family Medicine

## 2015-11-19 ENCOUNTER — Other Ambulatory Visit: Payer: Self-pay | Admitting: Family Medicine

## 2015-12-10 ENCOUNTER — Other Ambulatory Visit: Payer: Self-pay | Admitting: Family Medicine

## 2015-12-11 DIAGNOSIS — S80911A Unspecified superficial injury of right knee, initial encounter: Secondary | ICD-10-CM | POA: Diagnosis not present

## 2015-12-11 DIAGNOSIS — L538 Other specified erythematous conditions: Secondary | ICD-10-CM | POA: Diagnosis not present

## 2015-12-11 DIAGNOSIS — B078 Other viral warts: Secondary | ICD-10-CM | POA: Diagnosis not present

## 2015-12-11 DIAGNOSIS — L738 Other specified follicular disorders: Secondary | ICD-10-CM | POA: Diagnosis not present

## 2015-12-16 ENCOUNTER — Ambulatory Visit (INDEPENDENT_AMBULATORY_CARE_PROVIDER_SITE_OTHER): Payer: PPO | Admitting: Family Medicine

## 2015-12-16 ENCOUNTER — Encounter: Payer: Self-pay | Admitting: Family Medicine

## 2015-12-16 VITALS — BP 140/72 | HR 60 | Temp 98.8°F | Resp 16 | Ht 65.0 in | Wt 167.6 lb

## 2015-12-16 DIAGNOSIS — E785 Hyperlipidemia, unspecified: Secondary | ICD-10-CM | POA: Diagnosis not present

## 2015-12-16 LAB — COMPREHENSIVE METABOLIC PANEL
ALT: 13 U/L (ref 6–29)
AST: 14 U/L (ref 10–35)
Albumin: 4.2 g/dL (ref 3.6–5.1)
Alkaline Phosphatase: 91 U/L (ref 33–130)
BUN: 14 mg/dL (ref 7–25)
CO2: 26 mmol/L (ref 20–31)
Calcium: 9.2 mg/dL (ref 8.6–10.4)
Chloride: 106 mmol/L (ref 98–110)
Creat: 0.73 mg/dL (ref 0.50–1.05)
Glucose, Bld: 101 mg/dL — ABNORMAL HIGH (ref 65–99)
Potassium: 4.6 mmol/L (ref 3.5–5.3)
Sodium: 141 mmol/L (ref 135–146)
Total Bilirubin: 0.4 mg/dL (ref 0.2–1.2)
Total Protein: 6.9 g/dL (ref 6.1–8.1)

## 2015-12-16 LAB — LIPID PANEL
Cholesterol: 162 mg/dL (ref 125–200)
HDL: 53 mg/dL (ref >=46)
LDL Cholesterol: 94 mg/dL (ref <130)
Total CHOL/HDL Ratio: 3.1 Ratio (ref <=5.0)
Triglycerides: 77 mg/dL (ref <150)
VLDL: 15 mg/dL (ref <30)

## 2015-12-16 NOTE — Progress Notes (Signed)
Name: Janice Wilson   MRN: [id number]    DOB: 10-Dec-1955   Date:12/16/2015       Progress Note  Subjective  Chief Complaint  Chief Complaint  Patient presents with  . Hyperlipidemia    3 month follow up    Hyperlipidemia This is a chronic problem. The problem is uncontrolled. Recent lipid tests were reviewed and are high. Pertinent negatives include no chest pain, leg pain or myalgias. Current antihyperlipidemic treatment includes statins.     Past Medical History  Diagnosis Date  . Asthma   . Shortness of breath   . Sleep apnea     uses CPAP machine sleep study 9/ 2012 done at sleep med in [place]  . Anxiety   . Headache(784.0)   . Glaucoma   . Vertigo   . Panic attack   . Depression     Past Surgical History  Procedure Laterality Date  . Partial hysterectomy    . Cesarean section    . Anterior cervical decomp/discectomy fusion  07/05/2011    Procedure: ANTERIOR CERVICAL DECOMPRESSION/DISCECTOMY FUSION 3 LEVELS;  Surgeon: Cooper Render Pool;  Location: [place] [facility];  Service: Neurosurgery;  Laterality: N/A;  Cervical Four-Five, Cervical Five-Six, Cervical Six-Seven Anterior Cervical Decompression Fusion WITH ALLOGRAFT AND PLATING    Family History  Problem Relation Age of Onset  . Anesthesia problems Neg Hx   . Breast cancer Neg Hx   . Cancer Mother   . Stroke Father     Social History   Social History  . Marital Status: Single    Spouse Name: N/A  . Number of Children: N/A  . Years of Education: N/A   Occupational History  . Not on file.   Social History Main Topics  . Smoking status: Current Some Day Smoker -- 0.20 packs/day for 21 years    Types: Cigarettes    Last Attempt to Quit: 02/19/2013  . Smokeless tobacco: Never Used  . Alcohol Use: 0.0 oz/week    0 Standard drinks or equivalent per week     Comment: 1-2 beer a month  . Drug Use: No  . Sexual Activity: Not on file   Other Topics Concern  . Not on file   Social History Narrative      Current outpatient prescriptions:  .  acetaminophen (TYLENOL) 325 MG tablet, Take 325 mg by mouth every 6 (six) hours as needed. For pain , Disp: , Rfl:  .  albuterol (PROVENTIL HFA;VENTOLIN HFA) 108 (90 BASE) MCG/ACT inhaler, Inhale 1 puff into the lungs every 4 (four) hours as needed. For shortness of breath, Disp: , Rfl:  .  aspirin 81 MG tablet, Take 81 mg by mouth daily., Disp: , Rfl:  .  carisoprodol (SOMA) 350 MG tablet, Take 350 mg by mouth 4 (four) times daily as needed for muscle spasms., Disp: , Rfl:  .  conjugated estrogens (PREMARIN) vaginal cream, Place vaginally., Disp: , Rfl:  .  cyclobenzaprine (FLEXERIL) 10 MG tablet, Take 10 mg by mouth daily., Disp: , Rfl:  .  eszopiclone (LUNESTA) 2 MG TABS tablet, Reported on 07/03/2015, Disp: , Rfl:  .  fluticasone (FLONASE) 50 MCG/ACT nasal spray, Place 2 sprays into the nose daily as needed. FOR CONGESTION , Disp: , Rfl:  .  Fluticasone-Salmeterol (ADVAIR) 250-50 MCG/DOSE AEPB, Inhale 1 puff into the lungs every 12 (twelve) hours as needed. FOR WHEEZING , Disp: , Rfl:  .  gabapentin (NEURONTIN) 300 MG capsule, Take 1 capsule (300 mg total)  by mouth 3 (three) times daily., Disp: 90 capsule, Rfl: 2 .  ibuprofen (ADVIL,MOTRIN) 800 MG tablet, Take 800 mg by mouth daily as needed. FOR PAIN , Disp: , Rfl:  .  latanoprost (XALATAN) 0.005 % ophthalmic solution, Place 1 drop into both eyes at bedtime.  , Disp: , Rfl:  .  loratadine (CLARITIN) 10 MG tablet, Take 10 mg by mouth at bedtime., Disp: , Rfl:  .  LORazepam (ATIVAN) 0.5 MG tablet, TAKE 1 TABLET BY MOUTH AT BEDTIME, Disp: 30 tablet, Rfl: 2 .  meclizine (ANTIVERT) 25 MG tablet, Take 25 mg by mouth 3 (three) times daily as needed. Reported on 07/03/2015, Disp: , Rfl:  .  Melatonin 1 MG CAPS, Take 1 capsule by mouth daily. Reported on 07/03/2015, Disp: , Rfl:  .  meloxicam (MOBIC) 15 MG tablet, Take 1 tablet (15 mg total) by mouth daily., Disp: 30 tablet, Rfl: 2 .  montelukast (SINGULAIR)  10 MG tablet, Take by mouth., Disp: , Rfl:  .  Multiple Vitamins-Minerals (MULTIPLE VITAMINS/WOMENS PO), Take 1 tablet by mouth daily., Disp: , Rfl:  .  NEOMYCIN-POLYMYXIN-HYDROCORTISONE (CORTISPORIN) 1 % SOLN otic solution, Apply 1-2 drops to toe BID after soaking, Disp: 10 mL, Rfl: 1 .  rosuvastatin (CRESTOR) 5 MG tablet, TAKE 1 TABLET (5 MG TOTAL) BY MOUTH AT BEDTIME., Disp: 90 tablet, Rfl: 0 .  sertraline (ZOLOFT) 50 MG tablet, TAKE 1 TABLET BY MOUTH AT BEDTIME, Disp: 90 tablet, Rfl: 1 .  timolol (BETIMOL) 0.5 % ophthalmic solution, Place 1 drop into both eyes every morning.  , Disp: , Rfl:  .  tiZANidine (ZANAFLEX) 4 MG capsule, Take 1 capsule (4 mg total) by mouth every 8 (eight) hours as needed for muscle spasms., Disp: 40 capsule, Rfl: 1  Allergies  Allergen Reactions  . Oxycodone Hcl Other (See Comments)    REACTION:"MAKES ME PALE"  . Propoxyphene Other (See Comments)  . Propoxyphene     Review of Systems  Cardiovascular: Negative for chest pain.  Musculoskeletal: Negative for myalgias.    Objective  Filed Vitals:   12/16/15 0843  BP: 140/72  Pulse: 60  Temp: 98.8 F (37.1 C)  TempSrc: Oral  Resp: 16  Height: 5\' 5"  (1.651 m)  Weight: 167 lb 9.6 oz (76.023 kg)  SpO2: 97%    Physical Exam  Constitutional: She is oriented to person, place, and time and well-developed, well-nourished, and in no distress.  HENT:  Head: Normocephalic and atraumatic.  Cardiovascular: Normal rate and regular rhythm.   No murmur heard. Pulmonary/Chest: Effort normal and breath sounds normal.  Neurological: She is alert and oriented to person, place, and time.  Nursing note and vitals reviewed.      Assessment & Plan  1. Hyperlipidemia Repeat FLP, adjust statin therapy as necessary. - Lipid Profile - Comprehensive Metabolic Panel (CMET)    Nivea Wojdyla [facility] 12/16/2015 9:02 AM

## 2015-12-25 DIAGNOSIS — S20461A Insect bite (nonvenomous) of right back wall of thorax, initial encounter: Secondary | ICD-10-CM | POA: Diagnosis not present

## 2015-12-30 ENCOUNTER — Telehealth: Payer: Self-pay

## 2015-12-30 NOTE — Telephone Encounter (Signed)
Patient called to reschedule her colonoscopy. She was being rude and short tempered on the phone. Patient wanted to speak with someone with "more experience". I placed her on hold for [facility] (she was on the phone). Patient hung up, called [facility] and cancelled her appointment through them. We need to call her back later to reschedule her colonoscopy appointment.

## 2015-12-30 NOTE — Telephone Encounter (Signed)
Pt rescheduled for her colonoscopy to 02/13/16. [facility] notified.

## 2016-01-30 DIAGNOSIS — M5412 Radiculopathy, cervical region: Secondary | ICD-10-CM | POA: Diagnosis not present

## 2016-01-30 DIAGNOSIS — M4802 Spinal stenosis, cervical region: Secondary | ICD-10-CM | POA: Diagnosis not present

## 2016-02-10 ENCOUNTER — Other Ambulatory Visit: Payer: Self-pay | Admitting: Orthopedic Surgery

## 2016-02-10 DIAGNOSIS — M5412 Radiculopathy, cervical region: Secondary | ICD-10-CM

## 2016-02-19 ENCOUNTER — Other Ambulatory Visit: Payer: Self-pay | Admitting: Family Medicine

## 2016-02-19 DIAGNOSIS — G4733 Obstructive sleep apnea (adult) (pediatric): Secondary | ICD-10-CM | POA: Diagnosis not present

## 2016-02-20 ENCOUNTER — Ambulatory Visit
Admission: RE | Admit: 2016-02-20 | Discharge: 2016-02-20 | Disposition: A | Discharge status: Home / Self Care | Payer: PPO | Source: Ambulatory Visit | Attending: Orthopedic Surgery | Admitting: Orthopedic Surgery

## 2016-02-20 DIAGNOSIS — M5011 Cervical disc disorder with radiculopathy,  high cervical region: Secondary | ICD-10-CM | POA: Insufficient documentation

## 2016-02-20 DIAGNOSIS — M4802 Spinal stenosis, cervical region: Secondary | ICD-10-CM | POA: Insufficient documentation

## 2016-02-20 DIAGNOSIS — M5412 Radiculopathy, cervical region: Secondary | ICD-10-CM | POA: Insufficient documentation

## 2016-02-20 DIAGNOSIS — M5383 Other specified dorsopathies, cervicothoracic region: Secondary | ICD-10-CM | POA: Diagnosis not present

## 2016-02-20 MED ORDER — GADOBENATE DIMEGLUMINE 529 MG/ML IV SOLN
15.0000 mL | Freq: Once | INTRAVENOUS | Status: AC | PRN
  Administered 2016-02-20: 15 mL via INTRAVENOUS

## 2016-02-24 ENCOUNTER — Other Ambulatory Visit: Payer: Self-pay | Admitting: Family Medicine

## 2016-02-24 ENCOUNTER — Telehealth: Payer: Self-pay | Admitting: Family Medicine

## 2016-02-24 DIAGNOSIS — F329 Major depressive disorder, single episode, unspecified: Secondary | ICD-10-CM

## 2016-02-24 DIAGNOSIS — F32A Depression, unspecified: Secondary | ICD-10-CM

## 2016-02-24 DIAGNOSIS — F419 Anxiety disorder, unspecified: Principal | ICD-10-CM

## 2016-02-25 MED ORDER — LORAZEPAM 0.5 MG PO TABS: 0.5000 mg | ORAL_TABLET | Freq: Every day | ORAL | 2 refills | Status: DC

## 2016-02-25 NOTE — Telephone Encounter (Signed)
Prescription for lorazepam is printed and ready for pickup

## 2016-02-26 ENCOUNTER — Other Ambulatory Visit: Payer: Self-pay | Admitting: Family Medicine

## 2016-02-26 DIAGNOSIS — F419 Anxiety disorder, unspecified: Secondary | ICD-10-CM

## 2016-02-26 DIAGNOSIS — F32A Depression, unspecified: Secondary | ICD-10-CM

## 2016-02-26 DIAGNOSIS — F329 Major depressive disorder, single episode, unspecified: Secondary | ICD-10-CM

## 2016-03-01 ENCOUNTER — Other Ambulatory Visit: Payer: Self-pay | Admitting: Family Medicine

## 2016-03-01 DIAGNOSIS — G8929 Other chronic pain: Secondary | ICD-10-CM

## 2016-03-01 DIAGNOSIS — M542 Cervicalgia: Principal | ICD-10-CM

## 2016-03-14 ENCOUNTER — Other Ambulatory Visit: Payer: Self-pay | Admitting: Family Medicine

## 2016-03-17 ENCOUNTER — Encounter: Payer: Self-pay | Admitting: Family Medicine

## 2016-03-17 ENCOUNTER — Ambulatory Visit (INDEPENDENT_AMBULATORY_CARE_PROVIDER_SITE_OTHER): Payer: PPO | Admitting: Family Medicine

## 2016-03-17 ENCOUNTER — Telehealth: Payer: Self-pay

## 2016-03-17 VITALS — BP 137/71 | HR 80 | Temp 99.2°F | Resp 16 | Ht 65.0 in | Wt 159.4 lb

## 2016-03-17 DIAGNOSIS — R220 Localized swelling, mass and lump, head: Secondary | ICD-10-CM

## 2016-03-17 DIAGNOSIS — E785 Hyperlipidemia, unspecified: Secondary | ICD-10-CM | POA: Diagnosis not present

## 2016-03-17 DIAGNOSIS — J3089 Other allergic rhinitis: Secondary | ICD-10-CM

## 2016-03-17 LAB — COMPLETE METABOLIC PANEL WITH GFR
ALT: 18 U/L (ref 6–29)
AST: 16 U/L (ref 10–35)
Albumin: 4.4 g/dL (ref 3.6–5.1)
Alkaline Phosphatase: 96 U/L (ref 33–130)
BUN: 9 mg/dL (ref 7–25)
CO2: 25 mmol/L (ref 20–31)
Calcium: 9.6 mg/dL (ref 8.6–10.4)
Chloride: 107 mmol/L (ref 98–110)
Creat: 0.78 mg/dL (ref 0.50–1.05)
GFR, Est African American: >89 mL/min (ref >=60)
GFR, Est Non African American: 85 mL/min (ref >=60)
Glucose, Bld: 98 mg/dL (ref 65–99)
Potassium: 4.4 mmol/L (ref 3.5–5.3)
Sodium: 142 mmol/L (ref 135–146)
Total Bilirubin: 0.5 mg/dL (ref 0.2–1.2)
Total Protein: 7.2 g/dL (ref 6.1–8.1)

## 2016-03-17 LAB — LIPID PANEL
Cholesterol: 153 mg/dL (ref 125–200)
HDL: 42 mg/dL — ABNORMAL LOW (ref >=46)
LDL Cholesterol: 87 mg/dL (ref <130)
Total CHOL/HDL Ratio: 3.6 Ratio (ref <=5.0)
Triglycerides: 120 mg/dL (ref <150)
VLDL: 24 mg/dL (ref <30)

## 2016-03-17 MED ORDER — MONTELUKAST SODIUM 10 MG PO TABS: 10.0000 mg | ORAL_TABLET | Freq: Every day | ORAL | 1 refills | Status: DC

## 2016-03-17 NOTE — Progress Notes (Signed)
Name: Janice Wilson   MRN: [id number]    DOB: [birth year]   Date:03/17/2016       Progress Note  Subjective  Chief Complaint  Chief Complaint  Patient presents with  . Follow-up    3 mo  . Medication Refill    Hyperlipidemia  This is a chronic problem. The problem is controlled. Recent lipid tests were reviewed and are normal. Pertinent negatives include no leg pain, myalgias or shortness of breath. Current antihyperlipidemic treatment includes statins.   Year-round Allergies: Pt. Presents for med refills, she has allergies to pollen, dust, other environmental allergens including dog and cat hair. She has symptoms of cough, nasal congestion and rhinitis, takes Singulair 10 mg daily which helps relieve her symptoms.    Past Medical History:  Diagnosis Date  . Anxiety   . Asthma   . Depression   . Glaucoma   . Headache(784.0)   . Panic attack   . Shortness of breath   . Sleep apnea    uses CPAP machine sleep study 9/ 2012 done at sleep med in [place]  . Vertigo     Past Surgical History:  Procedure Laterality Date  . ANTERIOR CERVICAL DECOMP/DISCECTOMY FUSION  07/05/2011   Procedure: ANTERIOR CERVICAL DECOMPRESSION/DISCECTOMY FUSION 3 LEVELS;  Surgeon: Cooper Render Pool;  Location: [facility];  Service: Neurosurgery;  Laterality: N/A;  Cervical Four-Five, Cervical Five-Six, Cervical Six-Seven Anterior Cervical Decompression Fusion WITH ALLOGRAFT AND PLATING  . CESAREAN SECTION    . PARTIAL HYSTERECTOMY      Family History  Problem Relation Age of Onset  . Cancer Mother   . Stroke Father   . Anesthesia problems Neg Hx   . Breast cancer Neg Hx     Social History   Social History  . Marital status: Single    Spouse name: N/A  . Number of children: N/A  . Years of education: N/A   Occupational History  . Not on file.   Social History Main Topics  . Smoking status: Current Some Day Smoker    Packs/day: 0.20    Years: 21.00    Types: Cigarettes    Last  attempt to quit: 02/19/2013  . Smokeless tobacco: Never Used  . Alcohol use 0.0 oz/week     Comment: 1-2 beer a month  . Drug use: No  . Sexual activity: Not on file   Other Topics Concern  . Not on file   Social History Narrative  . No narrative on file     Current Outpatient Prescriptions:  .  albuterol (PROVENTIL HFA;VENTOLIN HFA) 108 (90 BASE) MCG/ACT inhaler, Inhale 1 puff into the lungs every 4 (four) hours as needed. For shortness of breath, Disp: , Rfl:  .  aspirin 81 MG tablet, Take 81 mg by mouth daily., Disp: , Rfl:  .  carisoprodol (SOMA) 350 MG tablet, Take 350 mg by mouth 4 (four) times daily as needed for muscle spasms., Disp: , Rfl:  .  conjugated estrogens (PREMARIN) vaginal cream, Place vaginally., Disp: , Rfl:  .  cyclobenzaprine (FLEXERIL) 10 MG tablet, Take 10 mg by mouth daily., Disp: , Rfl:  .  fluticasone (FLONASE) 50 MCG/ACT nasal spray, Place 2 sprays into the nose daily as needed. FOR CONGESTION , Disp: , Rfl:  .  Fluticasone-Salmeterol (ADVAIR) 250-50 MCG/DOSE AEPB, Inhale 1 puff into the lungs every 12 (twelve) hours as needed. FOR WHEEZING , Disp: , Rfl:  .  gabapentin (NEURONTIN) 300 MG capsule, TAKE ONE CAPSULE  BY MOUTH 3 TIMES A DAY, Disp: 90 capsule, Rfl: 2 .  ibuprofen (ADVIL,MOTRIN) 800 MG tablet, Take 800 mg by mouth daily as needed. FOR PAIN , Disp: , Rfl:  .  latanoprost (XALATAN) 0.005 % ophthalmic solution, Place 1 drop into both eyes at bedtime.  , Disp: , Rfl:  .  loratadine (CLARITIN) 10 MG tablet, Take 10 mg by mouth at bedtime., Disp: , Rfl:  .  LORazepam (ATIVAN) 0.5 MG tablet, Take 1 tablet (0.5 mg total) by mouth at bedtime., Disp: 30 tablet, Rfl: 2 .  meclizine (ANTIVERT) 25 MG tablet, Take 25 mg by mouth 3 (three) times daily as needed. Reported on 07/03/2015, Disp: , Rfl:  .  Melatonin 1 MG CAPS, Take 1 capsule by mouth daily. Reported on 07/03/2015, Disp: , Rfl:  .  meloxicam (MOBIC) 15 MG tablet, Take 1 tablet (15 mg total) by mouth  daily., Disp: 30 tablet, Rfl: 2 .  montelukast (SINGULAIR) 10 MG tablet, Take by mouth., Disp: , Rfl:  .  Multiple Vitamins-Minerals (MULTIPLE VITAMINS/WOMENS PO), Take 1 tablet by mouth daily., Disp: , Rfl:  .  NEOMYCIN-POLYMYXIN-HYDROCORTISONE (CORTISPORIN) 1 % SOLN otic solution, Apply 1-2 drops to toe BID after soaking, Disp: 10 mL, Rfl: 1 .  rosuvastatin (CRESTOR) 5 MG tablet, TAKE 1 TABLET (5 MG TOTAL) BY MOUTH AT BEDTIME., Disp: 90 tablet, Rfl: 0 .  sertraline (ZOLOFT) 50 MG tablet, TAKE 1 TABLET BY MOUTH AT BEDTIME, Disp: 90 tablet, Rfl: 1 .  acetaminophen (TYLENOL) 325 MG tablet, Take 325 mg by mouth every 6 (six) hours as needed. For pain , Disp: , Rfl:  .  eszopiclone (LUNESTA) 2 MG TABS tablet, Reported on 07/03/2015, Disp: , Rfl:  .  timolol (BETIMOL) 0.5 % ophthalmic solution, Place 1 drop into both eyes every morning.  , Disp: , Rfl:  .  tiZANidine (ZANAFLEX) 4 MG capsule, Take 1 capsule (4 mg total) by mouth every 8 (eight) hours as needed for muscle spasms. (Patient not taking: Reported on 03/17/2016), Disp: 40 capsule, Rfl: 1  Allergies  Allergen Reactions  . Oxycodone Hcl Other (See Comments)    REACTION:"MAKES ME PALE"  . Propoxyphene Other (See Comments)  . Propoxyphene      Review of Systems  Constitutional: Negative for chills, fever and malaise/fatigue.  HENT: Positive for congestion.   Respiratory: Negative for cough and shortness of breath.   Musculoskeletal: Negative for myalgias.     Objective  Vitals:   03/17/16 1011  BP: 137/71  Pulse: 80  Resp: 16  Temp: 99.2 F (37.3 C)  TempSrc: Oral  SpO2: 97%  Weight: 159 lb 6.4 oz (72.3 kg)  Height: 5\' 5"  (1.651 m)    Physical Exam  Constitutional: She is oriented to person, place, and time and well-developed, well-nourished, and in no distress.  HENT:  Head: Normocephalic and atraumatic.  Cystic mass on the left side of face bordering the left neck inferior to left ear lobe. Mass is non-tender, non  erythematous,   Cardiovascular: Normal rate, regular rhythm and normal heart sounds.   No murmur heard. Pulmonary/Chest: Effort normal and breath sounds normal.  Abdominal: Soft. Bowel sounds are normal. There is no tenderness.  Musculoskeletal:       Right ankle: She exhibits no swelling.       Left ankle: She exhibits no swelling.  Neurological: She is alert and oriented to person, place, and time.  Nursing note and vitals reviewed.    Assessment & Plan  1. Hyperlipidemia Obtain FLP - Lipid Profile - COMPLETE METABOLIC PANEL WITH GFR  2. Environmental and seasonal allergies Year-round allergies, continue on Singulair - montelukast (SINGULAIR) 10 MG tablet; Take 1 tablet (10 mg total) by mouth at bedtime.  Dispense: 90 tablet; Refill: 1  3. Facial mass  - Ambulatory referral to ENT   Abbie Jablon [facility] 03/17/2016 10:26 AM

## 2016-03-17 NOTE — Telephone Encounter (Signed)
PT is referral to [facility]. Faxed over her information and was inform that Pt has a balance with them and in order to get an appointment she would have to arrange a payment plan with them. Notified pt and gave pt [facility] contact information. Pt stated she will call and arrange something with them.   Bud Face, [place]

## 2016-04-12 ENCOUNTER — Encounter: Payer: Self-pay | Admitting: *Deleted

## 2016-04-14 NOTE — Discharge Instructions (Signed)

## 2016-04-16 ENCOUNTER — Encounter
Admission: RE | Disposition: A | Discharge status: Home / Self Care | Payer: Self-pay | Source: Ambulatory Visit | Attending: Gastroenterology

## 2016-04-16 ENCOUNTER — Ambulatory Visit: Payer: PPO | Admitting: Anesthesiology

## 2016-04-16 ENCOUNTER — Ambulatory Visit
Admission: RE | Admit: 2016-04-16 | Discharge: 2016-04-16 | Disposition: A | Discharge status: Home / Self Care | Payer: PPO | Source: Ambulatory Visit | Attending: Gastroenterology | Admitting: Gastroenterology

## 2016-04-16 DIAGNOSIS — Z1211 Encounter for screening for malignant neoplasm of colon: Secondary | ICD-10-CM | POA: Insufficient documentation

## 2016-04-16 DIAGNOSIS — F329 Major depressive disorder, single episode, unspecified: Secondary | ICD-10-CM | POA: Insufficient documentation

## 2016-04-16 DIAGNOSIS — K6389 Other specified diseases of intestine: Secondary | ICD-10-CM | POA: Diagnosis not present

## 2016-04-16 DIAGNOSIS — J45909 Unspecified asthma, uncomplicated: Secondary | ICD-10-CM | POA: Diagnosis not present

## 2016-04-16 DIAGNOSIS — F419 Anxiety disorder, unspecified: Secondary | ICD-10-CM | POA: Diagnosis not present

## 2016-04-16 DIAGNOSIS — G4730 Sleep apnea, unspecified: Secondary | ICD-10-CM | POA: Diagnosis not present

## 2016-04-16 DIAGNOSIS — K641 Second degree hemorrhoids: Secondary | ICD-10-CM | POA: Diagnosis not present

## 2016-04-16 DIAGNOSIS — F17210 Nicotine dependence, cigarettes, uncomplicated: Secondary | ICD-10-CM | POA: Diagnosis not present

## 2016-04-16 DIAGNOSIS — Z86010 Personal history of colonic polyps: Secondary | ICD-10-CM | POA: Diagnosis not present

## 2016-04-16 DIAGNOSIS — Z79899 Other long term (current) drug therapy: Secondary | ICD-10-CM | POA: Insufficient documentation

## 2016-04-16 HISTORY — DX: Dental restoration status: Z98.811

## 2016-04-16 HISTORY — PX: COLONOSCOPY WITH PROPOFOL: SHX5780

## 2016-04-16 HISTORY — DX: Presence of external hearing-aid: Z97.4

## 2016-04-16 HISTORY — DX: Cervicalgia: M54.2

## 2016-04-16 HISTORY — DX: Presence of dental prosthetic device (complete) (partial): Z97.2

## 2016-04-16 HISTORY — DX: Cardiac murmur, unspecified: R01.1

## 2016-04-16 SURGERY — COLONOSCOPY WITH PROPOFOL
Anesthesia: Monitor Anesthesia Care | Wound class: Contaminated

## 2016-04-16 MED ORDER — FENTANYL CITRATE (PF) 100 MCG/2ML IJ SOLN: 25.0000 ug | INTRAMUSCULAR | Status: DC | PRN

## 2016-04-16 MED ORDER — LACTATED RINGERS IV SOLN: 500.0000 mL | INTRAVENOUS | Status: DC

## 2016-04-16 MED ORDER — ACETAMINOPHEN 325 MG PO TABS: 325.0000 mg | ORAL_TABLET | ORAL | Status: DC | PRN

## 2016-04-16 MED ORDER — DEXAMETHASONE SODIUM PHOSPHATE 4 MG/ML IJ SOLN: 8.0000 mg | Freq: Once | INTRAMUSCULAR | Status: DC | PRN

## 2016-04-16 MED ORDER — PROPOFOL 10 MG/ML IV BOLUS
INTRAVENOUS | Status: DC | PRN
  Administered 2016-04-16 (×4): 50 mg via INTRAVENOUS

## 2016-04-16 MED ORDER — LACTATED RINGERS IV SOLN
INTRAVENOUS | Status: DC
  Administered 2016-04-16: 09:00:00 via INTRAVENOUS

## 2016-04-16 MED ORDER — ACETAMINOPHEN 160 MG/5ML PO SOLN: 325.0000 mg | ORAL | Status: DC | PRN

## 2016-04-16 MED ORDER — STERILE WATER FOR IRRIGATION IR SOLN
Status: DC | PRN
  Administered 2016-04-16: 10:00:00

## 2016-04-16 MED ORDER — LIDOCAINE HCL (CARDIAC) 20 MG/ML IV SOLN
INTRAVENOUS | Status: DC | PRN
  Administered 2016-04-16: 50 mg via INTRAVENOUS

## 2016-04-16 SURGICAL SUPPLY — 23 items

## 2016-04-16 NOTE — Transfer of Care (Signed)
Immediate Anesthesia Transfer of Care Note  Patient: Janice Wilson  Procedure(s) Performed: Procedure(s) with comments: COLONOSCOPY WITH PROPOFOL (N/A) - sleep apnea LATEX sensitivity  Patient Location: PACU  Anesthesia Type: MAC  Level of Consciousness: awake, alert  and patient cooperative  Airway and Oxygen Therapy: Patient Spontanous Breathing and Patient connected to supplemental oxygen  Post-op Assessment: Post-op Vital signs reviewed, Patient's Cardiovascular Status Stable, Respiratory Function Stable, Patent Airway and No signs of Nausea or vomiting  Post-op Vital Signs: Reviewed and stable  Complications: No apparent anesthesia complications

## 2016-04-16 NOTE — Op Note (Signed)
[facility] Patient Name: Janice Wilson Procedure Date: 04/16/2016 9:13 AM MRN: [id number] Account #: [id number] Date of Birth: Jul 04, 1955 Admit Type: Outpatient Age: 60 Room: [facility] OR ROOM 01 Gender: Female Note Status: Finalized Procedure:            Colonoscopy Indications:          Screening for colorectal malignant neoplasm Providers:            Lucilla Lame MD, MD Referring MD:         Otila Back. Manuella Ghazi (Referring MD) Medicines:            Propofol per Anesthesia Complications:        No immediate complications. Procedure:            Pre-Anesthesia Assessment:                       - Prior to the procedure, a History and Physical was                        performed, and patient medications and allergies were                        reviewed. The patient's tolerance of previous                        anesthesia was also reviewed. The risks and benefits of                        the procedure and the sedation options and risks were                        discussed with the patient. All questions were                        answered, and informed consent was obtained. Prior                        Anticoagulants: The patient has taken no previous                        anticoagulant or antiplatelet agents. ASA Grade                        Assessment: II - A patient with mild systemic disease.                        After reviewing the risks and benefits, the patient was                        deemed in satisfactory condition to undergo the                        procedure.                       After obtaining informed consent, the colonoscope was                        passed under direct vision. Throughout the procedure,  the patient's blood pressure, pulse, and oxygen                        saturations were monitored continuously. The was                        introduced through the anus and advanced to the the             cecum, identified by appendiceal orifice and ileocecal                        valve. The colonoscopy was performed without                        difficulty. The patient tolerated the procedure well.                        The quality of the bowel preparation was excellent. Findings:      The perianal and digital rectal examinations were normal.      A diffuse area of moderate melanosis was found in the entire colon.      Non-bleeding internal hemorrhoids were found during retroflexion. The       hemorrhoids were Grade II (internal hemorrhoids that prolapse but reduce       spontaneously). Impression:           - Melanosis in the colon.                       - Non-bleeding internal hemorrhoids.                       - No specimens collected. Recommendation:       - Discharge patient to home.                       - Resume previous diet.                       - Continue present medications.                       - Repeat colonoscopy in 10 years for screening unless                        any change in family history or lower GI problems. Procedure Code(s):    --- Professional ---                       [phone], Colonoscopy, flexible; diagnostic, including                        collection of specimen(s) by brushing or washing, when                        performed (separate procedure) Diagnosis Code(s):    --- Professional ---                       Z12.11, Encounter for screening for malignant neoplasm                        of colon  K63.89, Other specified diseases of intestine CPT copyright 2016 American Medical Association. All rights reserved. The codes documented in this report are preliminary and upon coder review may  be revised to meet current compliance requirements. Lucilla Lame MD, MD 04/16/2016 9:40:13 AM This report has been signed electronically. Number of Addenda: 0 Note Initiated On: 04/16/2016 9:13 AM Scope Withdrawal Time: 0 hours 6  minutes 59 seconds  Total Procedure Duration: 0 hours 8 minutes 46 seconds       [facility]

## 2016-04-16 NOTE — Anesthesia Preprocedure Evaluation (Signed)
Anesthesia Evaluation  Patient identified by MRN, date of birth, ID band Patient awake    Reviewed: Allergy & Precautions, H&P , NPO status , Patient's Chart, lab work & pertinent test results, reviewed documented beta blocker date and time   Airway Mallampati: II  TM Distance: >3 FB Neck ROM: limited    Dental no notable dental hx.    Pulmonary shortness of breath, asthma , sleep apnea and Continuous Positive Airway Pressure Ventilation , Current Smoker,    Pulmonary exam normal breath sounds clear to auscultation       Cardiovascular Exercise Tolerance: Good  Rhythm:regular Rate:Normal     Neuro/Psych  Headaches, negative psych ROS   GI/Hepatic negative GI ROS, Neg liver ROS,   Endo/Other  negative endocrine ROS  Renal/GU negative Renal ROS  negative genitourinary   Musculoskeletal   Abdominal   Peds  Hematology negative hematology ROS (+)   Anesthesia Other Findings   Reproductive/Obstetrics negative OB ROS                             Anesthesia Physical Anesthesia Plan  ASA: II  Anesthesia Plan: MAC   Post-op Pain Management:    Induction:   Airway Management Planned:   Additional Equipment:   Intra-op Plan:   Post-operative Plan:   Informed Consent: I have reviewed the patients History and Physical, chart, labs and discussed the procedure including the risks, benefits and alternatives for the proposed anesthesia with the patient or authorized representative who has indicated his/her understanding and acceptance.   Dental Advisory Given  Plan Discussed with: CRNA  Anesthesia Plan Comments:         Anesthesia Quick Evaluation

## 2016-04-16 NOTE — Anesthesia Postprocedure Evaluation (Signed)
Anesthesia Post Note  Patient: Janice Wilson  Procedure(s) Performed: Procedure(s) (LRB): COLONOSCOPY WITH PROPOFOL (N/A)  Patient location during evaluation: PACU Anesthesia Type: MAC Level of consciousness: awake and alert Pain management: pain level controlled Vital Signs Assessment: post-procedure vital signs reviewed and stable Respiratory status: spontaneous breathing, nonlabored ventilation and respiratory function stable Cardiovascular status: stable and blood pressure returned to baseline Anesthetic complications: no    DANIEL D KOVACS

## 2016-04-16 NOTE — H&P (Signed)
Lucilla Lame, MD [facility] [address], [place], [place] [address] Phone: [phone] Fax : [phone]  Primary Care Physician:  Keith Rake, MD Primary Gastroenterologist:  Dr. Allen Norris  Pre-Procedure History & Physical: HPI:  Janice Wilson is a 60 y.o. female is here for a screening colonoscopy.   Past Medical History:  Diagnosis Date  . Anxiety   . Asthma   . Dental crown present    dental implants - top, front  . Depression   . Glaucoma   . Headache(784.0)   . Heart murmur    followed by PCP  . Neck pain    s/p fusion.  limited side-to-side motion.  No limits up and down motion.  . Panic attack   . Shortness of breath   . Sleep apnea    uses CPAP machine sleep study 9/ 2012 done at sleep med in [place]  . Vertigo    no episodes in over 10 yrs  . Wears dentures    partial upper  . Wears hearing aid    left ear    Past Surgical History:  Procedure Laterality Date  . ANTERIOR CERVICAL DECOMP/DISCECTOMY FUSION  07/05/2011   Procedure: ANTERIOR CERVICAL DECOMPRESSION/DISCECTOMY FUSION 3 LEVELS;  Surgeon: Cooper Render Pool;  Location: [place] [facility];  Service: Neurosurgery;  Laterality: N/A;  Cervical Four-Five, Cervical Five-Six, Cervical Six-Seven Anterior Cervical Decompression Fusion WITH ALLOGRAFT AND PLATING  . CESAREAN SECTION    . PARTIAL HYSTERECTOMY      Prior to Admission medications   Medication Sig Start Date End Date Taking? Authorizing Provider  acetaminophen (TYLENOL) 325 MG tablet Take 325 mg by mouth every 6 (six) hours as needed. For pain    Yes Historical Provider, MD  albuterol (PROVENTIL HFA;VENTOLIN HFA) 108 (90 BASE) MCG/ACT inhaler Inhale 1 puff into the lungs every 4 (four) hours as needed. For shortness of breath   Yes Historical Provider, MD  aspirin 81 MG tablet Take 81 mg by mouth daily.   Yes Historical Provider, MD  conjugated estrogens (PREMARIN) vaginal cream Place vaginally. 08/14/15  Yes Historical Provider, MD  fluticasone (FLONASE)  50 MCG/ACT nasal spray Place 2 sprays into the nose daily as needed. FOR CONGESTION    Yes Historical Provider, MD  Fluticasone-Salmeterol (ADVAIR) 250-50 MCG/DOSE AEPB Inhale 1 puff into the lungs every 12 (twelve) hours as needed. FOR WHEEZING    Yes Historical Provider, MD  gabapentin (NEURONTIN) 300 MG capsule TAKE ONE CAPSULE BY MOUTH 3 TIMES A DAY 03/01/16  Yes Roselee Nova, MD  ibuprofen (ADVIL,MOTRIN) 800 MG tablet Take 800 mg by mouth daily as needed. FOR PAIN    Yes Historical Provider, MD  latanoprost (XALATAN) 0.005 % ophthalmic solution Place 1 drop into both eyes at bedtime.     Yes Historical Provider, MD  loratadine (CLARITIN) 10 MG tablet Take 10 mg by mouth at bedtime.   Yes Historical Provider, MD  LORazepam (ATIVAN) 0.5 MG tablet Take 1 tablet (0.5 mg total) by mouth at bedtime. 02/25/16  Yes Roselee Nova, MD  meclizine (ANTIVERT) 25 MG tablet Take 25 mg by mouth 3 (three) times daily as needed. Reported on 07/03/2015   Yes Historical Provider, MD  meloxicam (MOBIC) 15 MG tablet Take 1 tablet (15 mg total) by mouth daily. 01/29/15  Yes Max T Hyatt, DPM  montelukast (SINGULAIR) 10 MG tablet Take 1 tablet (10 mg total) by mouth at bedtime. 03/17/16  Yes Roselee Nova, MD  Multiple  Vitamins-Minerals (MULTIPLE VITAMINS/WOMENS PO) Take 1 tablet by mouth daily.   Yes Historical Provider, MD  rosuvastatin (CRESTOR) 5 MG tablet TAKE 1 TABLET (5 MG TOTAL) BY MOUTH AT BEDTIME. 03/15/16  Yes Roselee Nova, MD  sertraline (ZOLOFT) 50 MG tablet TAKE 1 TABLET BY MOUTH AT BEDTIME 02/24/16  Yes Roselee Nova, MD  timolol (BETIMOL) 0.5 % ophthalmic solution Place 1 drop into both eyes every morning.     Yes Historical Provider, MD  tiZANidine (ZANAFLEX) 4 MG capsule Take 1 capsule (4 mg total) by mouth every 8 (eight) hours as needed for muscle spasms. 04/09/15  Yes Roselee Nova, MD    Allergies as of 10/10/2015 - Review Complete 10/10/2015  Allergen Reaction Noted  . Oxycodone hcl  Other (See Comments) 06/18/2011  . Propoxyphene Other (See Comments) 01/02/2015  . Propoxyphene  01/02/2015    Family History  Problem Relation Age of Onset  . Cancer Mother   . Stroke Father   . Anesthesia problems Neg Hx   . Breast cancer Neg Hx     Social History   Social History  . Marital status: Single    Spouse name: N/A  . Number of children: N/A  . Years of education: N/A   Occupational History  . Not on file.   Social History Main Topics  . Smoking status: Current Some Day Smoker    Packs/day: 0.20    Years: 21.00    Types: Cigarettes    Last attempt to quit: 02/19/2013  . Smokeless tobacco: Never Used  . Alcohol use 0.0 oz/week     Comment: 1-2 beer a month(Holidays)  . Drug use: No  . Sexual activity: Not on file   Other Topics Concern  . Not on file   Social History Narrative  . No narrative on file    Review of Systems: See HPI, otherwise negative ROS  Physical Exam: BP 113/76   Pulse (!) 55   Temp 98.6 F (37 C) (Temporal)   Resp 16   Ht 5\' 5"  (1.651 m)   Wt 160 lb (72.6 kg)   SpO2 99%   BMI 26.63 kg/m  General:   Alert,  pleasant and cooperative in NAD Head:  Normocephalic and atraumatic. Neck:  Supple; no masses or thyromegaly. Lungs:  Clear throughout to auscultation.    Heart:  Regular rate and rhythm. Abdomen:  Soft, nontender and nondistended. Normal bowel sounds, without guarding, and without rebound.   Neurologic:  Alert and  oriented x4;  grossly normal neurologically.  Impression/Plan: Janice Wilson is now here to undergo a screening colonoscopy.  Risks, benefits, and alternatives regarding colonoscopy have been reviewed with the patient.  Questions have been answered.  All parties agreeable.

## 2016-04-16 NOTE — Anesthesia Procedure Notes (Signed)
Procedure Name: MAC Performed by: Walburga Hudman Pre-anesthesia Checklist: Patient identified, Emergency Drugs available, Suction available, Timeout performed and Patient being monitored Patient Re-evaluated:Patient Re-evaluated prior to inductionOxygen Delivery Method: Nasal cannula Placement Confirmation: positive ETCO2     

## 2016-04-16 NOTE — Anesthesia Procedure Notes (Signed)
Procedure Name: MAC Performed by: Aniyha Tate Pre-anesthesia Checklist: Patient identified, Emergency Drugs available, Suction available, Timeout performed and Patient being monitored Patient Re-evaluated:Patient Re-evaluated prior to inductionOxygen Delivery Method: Nasal cannula Placement Confirmation: positive ETCO2     

## 2016-04-19 ENCOUNTER — Encounter: Payer: Self-pay | Admitting: Gastroenterology

## 2016-04-27 DIAGNOSIS — H401131 Primary open-angle glaucoma, bilateral, mild stage: Secondary | ICD-10-CM | POA: Diagnosis not present

## 2016-05-17 ENCOUNTER — Ambulatory Visit (INDEPENDENT_AMBULATORY_CARE_PROVIDER_SITE_OTHER): Payer: PPO | Admitting: Family Medicine

## 2016-05-17 ENCOUNTER — Encounter: Payer: Self-pay | Admitting: Family Medicine

## 2016-05-17 VITALS — BP 118/84 | HR 70 | Temp 98.0°F | Resp 16 | Wt 166.2 lb

## 2016-05-17 DIAGNOSIS — Z114 Encounter for screening for human immunodeficiency virus [HIV]: Secondary | ICD-10-CM | POA: Diagnosis not present

## 2016-05-17 DIAGNOSIS — J209 Acute bronchitis, unspecified: Secondary | ICD-10-CM | POA: Diagnosis not present

## 2016-05-17 DIAGNOSIS — J00 Acute nasopharyngitis [common cold]: Secondary | ICD-10-CM | POA: Diagnosis not present

## 2016-05-17 DIAGNOSIS — F17200 Nicotine dependence, unspecified, uncomplicated: Secondary | ICD-10-CM | POA: Diagnosis not present

## 2016-05-17 DIAGNOSIS — Z1159 Encounter for screening for other viral diseases: Secondary | ICD-10-CM | POA: Diagnosis not present

## 2016-05-17 MED ORDER — VARENICLINE TARTRATE 0.5 MG X 11 & 1 MG X 42 PO MISC: ORAL | 0 refills | Status: DC

## 2016-05-17 MED ORDER — BENZONATATE 100 MG PO CAPS: 100.0000 mg | ORAL_CAPSULE | Freq: Three times a day (TID) | ORAL | 0 refills | Status: DC | PRN

## 2016-05-17 NOTE — Assessment & Plan Note (Signed)
Discussed one-time HIV screening recommendation per USPSTF guidelines; patient agrees with testing; HIV antibody ordered 

## 2016-05-17 NOTE — Assessment & Plan Note (Signed)
Discussed different forms of smoking cessation with the patient, and I encouraged cessation. Patient requests trial of Chantix.  I discussed common side effects including but not limited to nausea/vomiting, vivid dreams, behaviour changes, depression, suicidal ideation; also explained possible increased cardiovascular risk. After discussion, patient opts to try quitting with Chantix. The 1-800-QUIT-NOW number given in patient instructions. Patient was encouraged to choose a quit date about 1 week after starting medicine. 

## 2016-05-17 NOTE — Assessment & Plan Note (Signed)
Discussed one-time hep C screening recommendation for individuals born between 1945-1965 per USPSTF guidelines; patient agrees with testing; Hep C Ab ordered 

## 2016-05-17 NOTE — Progress Notes (Signed)
BP 118/84   Pulse 70   Temp 98 F (36.7 C) (Oral)   Resp 16   Wt 166 lb 3.2 oz (75.4 kg)   SpO2 96%   BMI 27.66 kg/m    Subjective:    Patient ID: Janice Wilson, female    DOB: 17-Oct-1955, 60 y.o.   MRN: [id number]  HPI: Janice Wilson is a 60 y.o. female  Chief Complaint  Patient presents with  . URI    over week syptoms include: cough, congestion, runny nose, sneezing, alittle sore throat.   She got sick 9 days ago; doesn't really feel badly but the cough is hanging on; using mucinex and prednisone (left over -- immediately warned to never have left over prednisone) Hx of asthma; using advair every day, on singulair; using rescue inhaler more this last month; fall is worst Little bit of runny nose, little bit of sore throat No fevers; just some chills; no aches; no rash; no travel Tried some robitussin before the mucinex Ears are okay; gets occasional headaches  Would like HIV and hep C screening  Interested in quitting smoking when I brought it up; she has not tried anything before  Depression screen [facility] 2/9 05/17/2016 03/17/2016 12/16/2015 07/03/2015 06/17/2015  Decreased Interest 0 0 0 0 0  Down, Depressed, Hopeless 0 0 0 1 1  PHQ - 2 Score 0 0 0 1 1   Relevant past medical, surgical, family and social history reviewed Past Medical History:  Diagnosis Date  . Anxiety   . Asthma   . Dental crown present    dental implants - top, front  . Depression   . Glaucoma   . Headache(784.0)   . Heart murmur    followed by PCP  . Neck pain    s/p fusion.  limited side-to-side motion.  No limits up and down motion.  . Panic attack   . Shortness of breath   . Sleep apnea    uses CPAP machine sleep study 9/ 2012 done at sleep med in [place]  . Vertigo    no episodes in over 10 yrs  . Wears dentures    partial upper  . Wears hearing aid    left ear   Past Surgical History:  Procedure Laterality Date  . ANTERIOR CERVICAL DECOMP/DISCECTOMY FUSION  07/05/2011     Procedure: ANTERIOR CERVICAL DECOMPRESSION/DISCECTOMY FUSION 3 LEVELS;  Surgeon: Cooper Render Pool;  Location: [place];  Service: Neurosurgery;  Laterality: N/A;  Cervical Four-Five, Cervical Five-Six, Cervical Six-Seven Anterior Cervical Decompression Fusion WITH ALLOGRAFT AND PLATING  . CESAREAN SECTION    . COLONOSCOPY WITH PROPOFOL N/A 04/16/2016   Procedure: COLONOSCOPY WITH PROPOFOL;  Surgeon: Lucilla Lame, MD;  Location: [place];  Service: Endoscopy;  Laterality: N/A;  sleep apnea LATEX sensitivity  . PARTIAL HYSTERECTOMY     Social History  Substance Use Topics  . Smoking status: Current Some Day Smoker    Packs/day: 0.20    Years: 21.00    Types: Cigarettes    Last attempt to quit: 02/19/2013  . Smokeless tobacco: Never Used  . Alcohol use 0.0 oz/week     Comment: 1-2 beer a month(Holidays)   Interim medical history since last visit reviewed. Allergies and medications reviewed  Review of Systems Per HPI unless specifically indicated above     Objective:    BP 118/84   Pulse 70   Temp 98 F (36.7 C) (Oral)   Resp 16  Wt 166 lb 3.2 oz (75.4 kg)   SpO2 96%   BMI 27.66 kg/m   Wt Readings from Last 3 Encounters:  05/17/16 166 lb 3.2 oz (75.4 kg)  04/16/16 160 lb (72.6 kg)  03/17/16 159 lb 6.4 oz (72.3 kg)   Physical Exam  Constitutional: She appears well-developed and well-nourished. No distress.  HENT:  Right Ear: Hearing, tympanic membrane, external ear and ear canal normal. Tympanic membrane is not erythematous. No middle ear effusion.  Left Ear: Hearing, tympanic membrane, external ear and ear canal normal. Tympanic membrane is not erythematous.  No middle ear effusion.  Nose: Nose normal. No rhinorrhea.  Mouth/Throat: Oropharynx is clear and moist and mucous membranes are normal. No oropharyngeal exudate, posterior oropharyngeal edema or posterior oropharyngeal erythema.  Eyes: Conjunctivae and EOM are normal. Right eye exhibits no discharge. Left  eye exhibits no discharge. No scleral icterus.  Cardiovascular: Normal rate and regular rhythm.   Pulmonary/Chest: Effort normal and breath sounds normal. No respiratory distress. She has no wheezes. She has no rales.  Lymphadenopathy:    She has no cervical adenopathy.  Skin: She is not diaphoretic.  Soft, mobile cystic lesion just below angle of jaw left side (patient says long-term, not new)  Psychiatric: She has a normal mood and affect. Her behavior is normal.   Results for orders placed or performed in visit on 03/17/16  Lipid Profile  Result Value Ref Range   Cholesterol 153 125 - 200 mg/dL   Triglycerides 120 <150 mg/dL   HDL 42 (L) >=46 mg/dL   Total CHOL/HDL Ratio 3.6 <=5.0 Ratio   VLDL 24 <30 mg/dL   LDL Cholesterol 87 <130 mg/dL  COMPLETE METABOLIC PANEL WITH GFR  Result Value Ref Range   Sodium 142 135 - 146 mmol/L   Potassium 4.4 3.5 - 5.3 mmol/L   Chloride 107 98 - 110 mmol/L   CO2 25 20 - 31 mmol/L   Glucose, Bld 98 65 - 99 mg/dL   BUN 9 7 - 25 mg/dL   Creat 0.78 0.50 - 1.05 mg/dL   Total Bilirubin 0.5 0.2 - 1.2 mg/dL   Alkaline Phosphatase 96 33 - 130 U/L   AST 16 10 - 35 U/L   ALT 18 6 - 29 U/L   Total Protein 7.2 6.1 - 8.1 g/dL   Albumin 4.4 3.6 - 5.1 g/dL   Calcium 9.6 8.6 - 10.4 mg/dL   GFR, Est African American >89 >=60 mL/min   GFR, Est Non African American 85 >=60 mL/min      Assessment & Plan:   Problem List Items Addressed This Visit      Other   Encounter for screening for HIV    Discussed one-time HIV screening recommendation per USPSTF guidelines; patient agrees with testing; HIV antibody ordered      Relevant Orders   HIV antibody   Encounter for hepatitis C screening test for low risk patient    Discussed one-time hep C screening recommendation for individuals born between 1945-1965 per USPSTF guidelines; patient agrees with testing; Hep C Ab ordered      Relevant Orders   Hepatitis C Antibody   Compulsive tobacco user syndrome     Discussed different forms of smoking cessation with the patient, and I encouraged cessation. Patient requests trial of Chantix.  I discussed common side effects including but not limited to nausea/vomiting, vivid dreams, behaviour changes, depression, suicidal ideation; also explained possible increased cardiovascular risk. After discussion, patient opts to try  quitting with Chantix. The 1-800-QUIT-NOW number given in patient instructions. Patient was encouraged to choose a quit date about 1 week after starting medicine.      Relevant Medications   varenicline (CHANTIX STARTING MONTH PAK) 0.5 MG X 11 & 1 MG X 42 tablet    Other Visit Diagnoses    Acute nasopharyngitis    -  Primary   likely viral; no need for antibiotics; rest, hydration   Acute bronchitis, unspecified organism       likely viral; no need for antibiotics; rest, hydration, tessalon perles; may continue inhalers prn      Follow up plan: Return if symptoms worsen or fail to improve.  An after-visit summary was printed and given to the patient at [place].  Please see the patient instructions which may contain other information and recommendations beyond what is mentioned above in the assessment and plan.  Meds ordered this encounter  Medications  . varenicline (CHANTIX STARTING MONTH PAK) 0.5 MG X 11 & 1 MG X 42 tablet    Sig: Take one 0.5 mg tablet by mouth once daily for 3 days, then increase to one 0.5 mg tablet twice daily for 4 days, then increase to one 1 mg tablet twice daily.    Dispense:  53 tablet    Refill:  0  . benzonatate (TESSALON) 100 MG capsule    Sig: Take 1 capsule (100 mg total) by mouth 3 (three) times daily as needed for cough.    Dispense:  30 capsule    Refill:  0    Orders Placed This Encounter  Procedures  . HIV antibody  . Hepatitis C Antibody

## 2016-05-17 NOTE — Patient Instructions (Addendum)
I do encourage you to quit smoking Call [phone] to sign up for smoking cessation classes You can call 1-800-QUIT-NOW to talk with a smoking cessation coach  Use the mucinex twice a day for the next week to help loosen phlegm Start Chantix to help you quit smoking Pick your quit date on day 8 of the medicine  Try vitamin C (orange juice if not diabetic or vitamin C tablets) and drink green tea to help your immune system during your illness Get plenty of rest and hydration   Steps to Quit Smoking Smoking tobacco can be harmful to your health and can affect almost every organ in your body. Smoking puts you, and those around you, at risk for developing many serious chronic diseases. Quitting smoking is difficult, but it is one of the best things that you can do for your health. It is never too late to quit. What are the benefits of quitting smoking? When you quit smoking, you lower your risk of developing serious diseases and conditions, such as:  Lung cancer or lung disease, such as COPD.  Heart disease.  Stroke.  Heart attack.  Infertility.  Osteoporosis and bone fractures. Additionally, symptoms such as coughing, wheezing, and shortness of breath may get better when you quit. You may also find that you get sick less often because your body is stronger at fighting off colds and infections. If you are pregnant, quitting smoking can help to reduce your chances of having a baby of low birth weight. How do I get ready to quit? When you decide to quit smoking, create a plan to make sure that you are successful. Before you quit:  Pick a date to quit. Set a date within the next two weeks to give you time to prepare.  Write down the reasons why you are quitting. Keep this list in places where you will see it often, such as on your bathroom mirror or in your car or wallet.  Identify the people, places, things, and activities that make you want to smoke (triggers) and avoid them. Make  sure to take these actions:  Throw away all cigarettes at home, at work, and in your car.  Throw away smoking accessories, such as Scientist, research (medical).  Clean your car and make sure to empty the ashtray.  Clean your home, including curtains and carpets.  Tell your family, friends, and coworkers that you are quitting. Support from your loved ones can make quitting easier.  Talk with your health care provider about your options for quitting smoking.  Find out what treatment options are covered by your health insurance. What strategies can I use to quit smoking? Talk with your healthcare provider about different strategies to quit smoking. Some strategies include:  Quitting smoking altogether instead of gradually lessening how much you smoke over a period of time. Research shows that quitting "cold [place]" is more successful than gradually quitting.  Attending in-person counseling to help you build problem-solving skills. You are more likely to have success in quitting if you attend several counseling sessions. Even short sessions of 10 minutes can be effective.  Finding resources and support systems that can help you to quit smoking and remain smoke-free after you quit. These resources are most helpful when you use them often. They can include:  Online chats with a Social worker.  Telephone quitlines.  Printed Furniture conservator/restorer.  Support groups or group counseling.  Text messaging programs.  Mobile phone applications.  Taking medicines to help you quit smoking. (  If you are pregnant or breastfeeding, talk with your health care provider first.) Some medicines contain nicotine and some do not. Both types of medicines help with cravings, but the medicines that include nicotine help to relieve withdrawal symptoms. Your health care provider may recommend:  Nicotine patches, gum, or lozenges.  Nicotine inhalers or sprays.  Non-nicotine medicine that is taken by mouth. Talk with your  health care provider about combining strategies, such as taking medicines while you are also receiving in-person counseling. Using these two strategies together makes you more likely to succeed in quitting than if you used either strategy on its own. If you are pregnant or breastfeeding, talk with your health care provider about finding counseling or other support strategies to quit smoking. Do not take medicine to help you quit smoking unless told to do so by your health care provider. What things can I do to make it easier to quit? Quitting smoking might feel overwhelming at first, but there is a lot that you can do to make it easier. Take these important actions:  Reach out to your family and friends and ask that they support and encourage you during this time. Call telephone quitlines, reach out to support groups, or work with a counselor for support.  Ask people who smoke to avoid smoking around you.  Avoid places that trigger you to smoke, such as bars, parties, or smoke-break areas at work.  Spend time around people who do not smoke.  Lessen stress in your life, because stress can be a smoking trigger for some people. To lessen stress, try:  Exercising regularly.  Deep-breathing exercises.  Yoga.  Meditating.  Performing a body scan. This involves closing your eyes, scanning your body from head to toe, and noticing which parts of your body are particularly tense. Purposefully relax the muscles in those areas.  Download or purchase mobile phone or tablet apps (applications) that can help you stick to your quit plan by providing reminders, tips, and encouragement. There are many free apps, such as QuitGuide from the [employer] Office manager for Disease Control and Prevention). You can find other support for quitting smoking (smoking cessation) through smokefree.gov and other websites. How will I feel when I quit smoking? Within the first 24 hours of quitting smoking, you may start to feel some  withdrawal symptoms. These symptoms are usually most noticeable 2-3 days after quitting, but they usually do not last beyond 2-3 weeks. Changes or symptoms that you might experience include:  Mood swings.  Restlessness, anxiety, or irritation.  Difficulty concentrating.  Dizziness.  Strong cravings for sugary foods in addition to nicotine.  Mild weight gain.  Constipation.  Nausea.  Coughing or a sore throat.  Changes in how your medicines work in your body.  A depressed mood.  Difficulty sleeping (insomnia). After the first 2-3 weeks of quitting, you may start to notice more positive results, such as:  Improved sense of smell and taste.  Decreased coughing and sore throat.  Slower heart rate.  Lower blood pressure.  Clearer skin.  The ability to breathe more easily.  Fewer sick days. Quitting smoking is very challenging for most people. Do not get discouraged if you are not successful the first time. Some people need to make many attempts to quit before they achieve long-term success. Do your best to stick to your quit plan, and talk with your health care provider if you have any questions or concerns. This information is not intended to replace advice given to  you by your health care provider. Make sure you discuss any questions you have with your health care provider. Document Released: 06/01/2001 Document Revised: 02/03/2016 Document Reviewed: 10/22/2014 Elsevier Interactive Patient Education  2017 [employer].

## 2016-05-18 LAB — HEPATITIS C ANTIBODY: HCV Ab: NEGATIVE

## 2016-05-18 LAB — HIV ANTIBODY (ROUTINE TESTING W REFLEX): HIV 1&2 Ab, 4th Generation: NONREACTIVE

## 2016-05-27 ENCOUNTER — Other Ambulatory Visit: Payer: Self-pay | Admitting: Emergency Medicine

## 2016-05-27 ENCOUNTER — Telehealth: Payer: Self-pay | Admitting: Family Medicine

## 2016-05-27 DIAGNOSIS — F32A Depression, unspecified: Secondary | ICD-10-CM

## 2016-05-27 DIAGNOSIS — F329 Major depressive disorder, single episode, unspecified: Secondary | ICD-10-CM

## 2016-05-27 DIAGNOSIS — F419 Anxiety disorder, unspecified: Principal | ICD-10-CM

## 2016-05-27 MED ORDER — LORAZEPAM 0.5 MG PO TABS: 0.5000 mg | ORAL_TABLET | Freq: Every day | ORAL | 0 refills | Status: DC

## 2016-05-27 NOTE — Telephone Encounter (Signed)
Pt said that the pharm has sent in last Thurs and Friday and then [facility] of this week for her Lorazepam and she has been out of it and her heart is now fluttering . Could you please refill this for her. Pharm is [facility]

## 2016-06-01 ENCOUNTER — Ambulatory Visit: Payer: PPO | Admitting: Family Medicine

## 2016-06-01 NOTE — Telephone Encounter (Signed)
Patient picked up refill

## 2016-06-12 ENCOUNTER — Other Ambulatory Visit: Payer: Self-pay | Admitting: Family Medicine

## 2016-06-12 DIAGNOSIS — G8929 Other chronic pain: Secondary | ICD-10-CM

## 2016-06-12 DIAGNOSIS — M542 Cervicalgia: Principal | ICD-10-CM

## 2016-06-23 ENCOUNTER — Other Ambulatory Visit: Payer: Self-pay | Admitting: Family Medicine

## 2016-06-23 DIAGNOSIS — F32A Depression, unspecified: Secondary | ICD-10-CM

## 2016-06-23 DIAGNOSIS — F329 Major depressive disorder, single episode, unspecified: Secondary | ICD-10-CM

## 2016-06-23 DIAGNOSIS — F419 Anxiety disorder, unspecified: Secondary | ICD-10-CM

## 2016-08-10 ENCOUNTER — Other Ambulatory Visit: Payer: Self-pay | Admitting: Family Medicine

## 2016-08-10 ENCOUNTER — Encounter: Payer: Self-pay | Admitting: Family Medicine

## 2016-08-10 ENCOUNTER — Ambulatory Visit (INDEPENDENT_AMBULATORY_CARE_PROVIDER_SITE_OTHER): Payer: PPO | Admitting: Family Medicine

## 2016-08-10 VITALS — BP 142/76 | HR 76 | Temp 98.5°F | Resp 16 | Ht 65.0 in | Wt 174.1 lb

## 2016-08-10 DIAGNOSIS — Z124 Encounter for screening for malignant neoplasm of cervix: Secondary | ICD-10-CM

## 2016-08-10 DIAGNOSIS — M79671 Pain in right foot: Secondary | ICD-10-CM | POA: Diagnosis not present

## 2016-08-10 DIAGNOSIS — L089 Local infection of the skin and subcutaneous tissue, unspecified: Secondary | ICD-10-CM

## 2016-08-10 DIAGNOSIS — Z1231 Encounter for screening mammogram for malignant neoplasm of breast: Secondary | ICD-10-CM

## 2016-08-10 DIAGNOSIS — E559 Vitamin D deficiency, unspecified: Secondary | ICD-10-CM | POA: Diagnosis not present

## 2016-08-10 DIAGNOSIS — E785 Hyperlipidemia, unspecified: Secondary | ICD-10-CM | POA: Diagnosis not present

## 2016-08-10 DIAGNOSIS — M25561 Pain in right knee: Secondary | ICD-10-CM

## 2016-08-10 DIAGNOSIS — Z1239 Encounter for other screening for malignant neoplasm of breast: Secondary | ICD-10-CM

## 2016-08-10 DIAGNOSIS — I10 Essential (primary) hypertension: Secondary | ICD-10-CM | POA: Diagnosis not present

## 2016-08-10 DIAGNOSIS — Z0000 Encounter for general adult medical examination without abnormal findings: Secondary | ICD-10-CM | POA: Diagnosis not present

## 2016-08-10 DIAGNOSIS — B9689 Other specified bacterial agents as the cause of diseases classified elsewhere: Secondary | ICD-10-CM

## 2016-08-10 DIAGNOSIS — G8929 Other chronic pain: Secondary | ICD-10-CM | POA: Diagnosis not present

## 2016-08-10 LAB — CBC WITH DIFFERENTIAL/PLATELET
Basophils Absolute: 0 cells/uL (ref 0–200)
Basophils Relative: 0 %
Eosinophils Absolute: 0 cells/uL — ABNORMAL LOW (ref 15–500)
Eosinophils Relative: 0 %
HCT: 42.9 % (ref 35.0–45.0)
Hemoglobin: 14.2 g/dL (ref 11.7–15.5)
Lymphocytes Relative: 34 %
Lymphs Abs: 2550 cells/uL (ref 850–3900)
MCH: 29.6 pg (ref 27.0–33.0)
MCHC: 33.1 g/dL (ref 32.0–36.0)
MCV: 89.6 fL (ref 80.0–100.0)
MPV: 10.5 fL (ref 7.5–12.5)
Monocytes Absolute: 375 cells/uL (ref 200–950)
Monocytes Relative: 5 %
Neutro Abs: 4575 cells/uL (ref 1500–7800)
Neutrophils Relative %: 61 %
Platelets: 223 10*3/uL (ref 140–400)
RBC: 4.79 MIL/uL (ref 3.80–5.10)
RDW: 14.4 % (ref 11.0–15.0)
WBC: 7.5 10*3/uL (ref 3.8–10.8)

## 2016-08-10 LAB — COMPLETE METABOLIC PANEL WITH GFR
ALT: 23 U/L (ref 6–29)
AST: 27 U/L (ref 10–35)
Albumin: 4.2 g/dL (ref 3.6–5.1)
Alkaline Phosphatase: 87 U/L (ref 33–130)
BUN: 8 mg/dL (ref 7–25)
CO2: 27 mmol/L (ref 20–31)
Calcium: 9.4 mg/dL (ref 8.6–10.4)
Chloride: 107 mmol/L (ref 98–110)
Creat: 0.78 mg/dL (ref 0.50–1.05)
GFR, Est African American: >89 mL/min (ref >=60)
GFR, Est Non African American: 84 mL/min (ref >=60)
Glucose, Bld: 84 mg/dL (ref 65–99)
Potassium: 4.8 mmol/L (ref 3.5–5.3)
Sodium: 141 mmol/L (ref 135–146)
Total Bilirubin: 0.6 mg/dL (ref 0.2–1.2)
Total Protein: 6.9 g/dL (ref 6.1–8.1)

## 2016-08-10 LAB — TSH: TSH: 0.7 mIU/L

## 2016-08-10 MED ORDER — MUPIROCIN CALCIUM 2 % EX CREA: 1.0000 "application " | TOPICAL_CREAM | Freq: Two times a day (BID) | CUTANEOUS | 0 refills | Status: DC

## 2016-08-10 NOTE — Progress Notes (Signed)
Name: Janice Wilson   MRN: [id number]    DOB: 05/04/1956   Date:08/10/2016       Progress Note  Subjective  Chief Complaint  Chief Complaint  Patient presents with  . Annual Exam    HPI  Pt. Presents for a Complete Physical Exam.  She is due for a mammogram and Pap Smear. Colonoscopy in 2027.   Past Medical History:  Diagnosis Date  . Anxiety   . Asthma   . Dental crown present    dental implants - top, front  . Depression   . Glaucoma   . Headache(784.0)   . Heart murmur    followed by PCP  . Neck pain    s/p fusion.  limited side-to-side motion.  No limits up and down motion.  . Panic attack   . Shortness of breath   . Sleep apnea    uses CPAP machine sleep study 9/ 2012 done at sleep med in [place]  . Vertigo    no episodes in over 10 yrs  . Wears dentures    partial upper  . Wears hearing aid    left ear    Past Surgical History:  Procedure Laterality Date  . ANTERIOR CERVICAL DECOMP/DISCECTOMY FUSION  07/05/2011   Procedure: ANTERIOR CERVICAL DECOMPRESSION/DISCECTOMY FUSION 3 LEVELS;  Surgeon: Cooper Render Pool;  Location: [facility];  Service: Neurosurgery;  Laterality: N/A;  Cervical Four-Five, Cervical Five-Six, Cervical Six-Seven Anterior Cervical Decompression Fusion WITH ALLOGRAFT AND PLATING  . CESAREAN SECTION    . COLONOSCOPY WITH PROPOFOL N/A 04/16/2016   Procedure: COLONOSCOPY WITH PROPOFOL;  Surgeon: Lucilla Lame, MD;  Location: [place];  Service: Endoscopy;  Laterality: N/A;  sleep apnea LATEX sensitivity  . PARTIAL HYSTERECTOMY      Family History  Problem Relation Age of Onset  . Cancer Mother   . Stroke Father   . Anesthesia problems Neg Hx   . Breast cancer Neg Hx     Social History   Social History  . Marital status: Single    Spouse name: N/A  . Number of children: N/A  . Years of education: N/A   Occupational History  . Not on file.   Social History Main Topics  . Smoking status: Current Some Day Smoker   Packs/day: 0.20    Years: 21.00    Types: Cigarettes    Last attempt to quit: 02/19/2013  . Smokeless tobacco: Never Used  . Alcohol use 0.0 oz/week     Comment: 1-2 beer a month(Holidays)  . Drug use: No  . Sexual activity: Not on file   Other Topics Concern  . Not on file   Social History Narrative  . No narrative on file     Current Outpatient Prescriptions:  .  acetaminophen (TYLENOL) 325 MG tablet, Take 325 mg by mouth every 6 (six) hours as needed. For pain , Disp: , Rfl:  .  albuterol (PROVENTIL HFA;VENTOLIN HFA) 108 (90 BASE) MCG/ACT inhaler, Inhale 1 puff into the lungs every 4 (four) hours as needed. For shortness of breath, Disp: , Rfl:  .  aspirin 81 MG tablet, Take 81 mg by mouth daily., Disp: , Rfl:  .  conjugated estrogens (PREMARIN) vaginal cream, Place vaginally., Disp: , Rfl:  .  fluticasone (FLONASE) 50 MCG/ACT nasal spray, Place 2 sprays into the nose daily as needed. FOR CONGESTION , Disp: , Rfl:  .  Fluticasone-Salmeterol (ADVAIR) 250-50 MCG/DOSE AEPB, Inhale 1 puff into the lungs every 12 (  twelve) hours as needed. FOR WHEEZING , Disp: , Rfl:  .  gabapentin (NEURONTIN) 300 MG capsule, TAKE ONE CAPSULE BY MOUTH 3 TIMES A DAY, Disp: 90 capsule, Rfl: 2 .  ibuprofen (ADVIL,MOTRIN) 800 MG tablet, Take 800 mg by mouth daily as needed. FOR PAIN , Disp: , Rfl:  .  latanoprost (XALATAN) 0.005 % ophthalmic solution, Place 1 drop into both eyes at bedtime.  , Disp: , Rfl:  .  loratadine (CLARITIN) 10 MG tablet, Take 10 mg by mouth at bedtime., Disp: , Rfl:  .  LORazepam (ATIVAN) 0.5 MG tablet, TAKE 1 TABLET BY MOUTH EVERY NIGHT AT BEDTIME, Disp: 30 tablet, Rfl: 2 .  meloxicam (MOBIC) 15 MG tablet, Take 1 tablet (15 mg total) by mouth daily., Disp: 30 tablet, Rfl: 2 .  montelukast (SINGULAIR) 10 MG tablet, Take 1 tablet (10 mg total) by mouth at bedtime., Disp: 90 tablet, Rfl: 1 .  Multiple Vitamins-Minerals (MULTIPLE VITAMINS/WOMENS PO), Take 1 tablet by mouth daily., Disp: ,  Rfl:  .  rosuvastatin (CRESTOR) 5 MG tablet, TAKE 1 TABLET (5 MG TOTAL) BY MOUTH AT BEDTIME., Disp: 90 tablet, Rfl: 0 .  sertraline (ZOLOFT) 50 MG tablet, TAKE 1 TABLET BY MOUTH AT BEDTIME, Disp: 90 tablet, Rfl: 1 .  timolol (BETIMOL) 0.5 % ophthalmic solution, Place 1 drop into both eyes every morning.  , Disp: , Rfl:  .  tiZANidine (ZANAFLEX) 4 MG capsule, Take 1 capsule (4 mg total) by mouth every 8 (eight) hours as needed for muscle spasms., Disp: 40 capsule, Rfl: 1 .  meclizine (ANTIVERT) 25 MG tablet, Take 25 mg by mouth 3 (three) times daily as needed. Reported on 07/03/2015, Disp: , Rfl:  .  varenicline (CHANTIX STARTING MONTH PAK) 0.5 MG X 11 & 1 MG X 42 tablet, Take one 0.5 mg tablet by mouth once daily for 3 days, then increase to one 0.5 mg tablet twice daily for 4 days, then increase to one 1 mg tablet twice daily. (Patient not taking: Reported on 08/10/2016), Disp: 53 tablet, Rfl: 0  Allergies  Allergen Reactions  . Oxycodone Hcl Other (See Comments)    REACTION:"MAKES ME PALE"  . Propoxyphene Nausea And Vomiting  . Latex Rash    Irritation  (Condoms and gloves)     Review of Systems  Constitutional: Negative for chills, fever, malaise/fatigue and weight loss.  HENT: Positive for sinus pain and sore throat. Negative for congestion and ear pain.   Eyes: Negative for blurred vision and double vision.  Respiratory: Negative for cough and shortness of breath.   Cardiovascular: Negative for chest pain and leg swelling.  Gastrointestinal: Positive for constipation (occasional constipation). Negative for abdominal pain, blood in stool, diarrhea, nausea and vomiting.  Genitourinary: Negative for dysuria and hematuria.  Musculoskeletal: Positive for back pain, joint pain (left and right knee pain.) and neck pain.  Skin: Negative for rash.  Neurological: Negative for dizziness and headaches.  Psychiatric/Behavioral: Negative for depression. The patient is nervous/anxious and has  insomnia.     Objective  Vitals:   08/10/16 0916  BP: (!) 142/76  Pulse: 76  Resp: 16  Temp: 98.5 F (36.9 C)  TempSrc: Oral  SpO2: 95%  Weight: 174 lb 1.6 oz (79 kg)  Height: 5\' 5"  (1.651 m)    Physical Exam  Constitutional: She is oriented to person, place, and time and well-developed, well-nourished, and in no distress.  HENT:  Head: Normocephalic and atraumatic.  Eyes: Pupils are equal, round, and  reactive to light.  Neck: Normal range of motion. Neck supple.  Cardiovascular: Normal rate, regular rhythm and normal heart sounds.   No murmur heard. Pulmonary/Chest: Effort normal and breath sounds normal. She has no wheezes.  Abdominal: Soft. Bowel sounds are normal. There is no tenderness.  Genitourinary: Vagina normal and cervix normal. Cervix exhibits no lesion. Vagina exhibits no exudate. No vaginal discharge found.  Neurological: She is alert and oriented to person, place, and time.  Skin: Skin is warm and dry.  Psychiatric: Mood, memory, affect and judgment normal.  Nursing note and vitals reviewed.    Assessment & Plan  1. Annual physical exam Obtain age-appropriate laboratory work up. - CBC with Differential/Platelet - TSH - VITAMIN D 25 Hydroxy (Vit-D Deficiency, Fractures) - Hepatitis C antibody - COMPLETE METABOLIC PANEL WITH GFR  2. Screening for cervical cancer  - Pap IG and HPV (high risk) DNA detection  3. Screening for breast cancer  - MM Digital Screening; Future  4. Chronic pain of right knee Referral to Orthopedics for persistent pain in the right knee. - Ambulatory referral to Orthopedic Surgery  5. Chronic pain in right foot  - Ambulatory referral to Podiatry  6. Bacterial skin infection She has used mupirocin cream in the past for infectious pustules in her armpit and on her back, refill provided.  - mupirocin cream (BACTROBAN) 2 %; Apply 1 application topically 2 (two) times daily.  Dispense: 15 g; Refill: 0  7. Hyperlipidemia,  unspecified hyperlipidemia type  - Lipid panel   Elysha Daw Asad A. [facility] 08/10/2016 9:39 AM

## 2016-08-11 ENCOUNTER — Encounter: Payer: Self-pay | Admitting: Family Medicine

## 2016-08-11 ENCOUNTER — Telehealth: Payer: Self-pay | Admitting: Emergency Medicine

## 2016-08-11 ENCOUNTER — Telehealth: Payer: Self-pay | Admitting: Family Medicine

## 2016-08-11 LAB — LIPID PANEL
Cholesterol: 136 mg/dL (ref <200)
HDL: 39 mg/dL — ABNORMAL LOW (ref >50)
LDL Cholesterol: 82 mg/dL (ref <100)
Total CHOL/HDL Ratio: 3.5 Ratio (ref <5.0)
Triglycerides: 73 mg/dL (ref <150)
VLDL: 15 mg/dL (ref <30)

## 2016-08-11 LAB — VITAMIN D 25 HYDROXY (VIT D DEFICIENCY, FRACTURES): Vit D, 25-Hydroxy: 22 ng/mL — ABNORMAL LOW (ref 30–100)

## 2016-08-11 LAB — HEPATITIS C ANTIBODY: HCV Ab: NEGATIVE

## 2016-08-11 MED ORDER — VITAMIN D (ERGOCALCIFEROL) 1.25 MG (50000 UNIT) PO CAPS: 50000.0000 [IU] | ORAL_CAPSULE | ORAL | 0 refills | Status: DC

## 2016-08-11 NOTE — Telephone Encounter (Signed)
Pt has a question pertianing to her lab results and is asking that you please return her call [phone]

## 2016-08-11 NOTE — Telephone Encounter (Signed)
Patient has been notified of lab results and a prescription for vitamin D3 50,000 units one capsule once a week x12 weeks has been sent to [facility] [address] per Dr. Manuella Ghazi, patient has been notified

## 2016-08-12 LAB — PAP IG AND HPV HIGH-RISK: HPV DNA High Risk: NOT DETECTED

## 2016-08-17 ENCOUNTER — Ambulatory Visit: Payer: Self-pay | Admitting: Family Medicine

## 2016-08-19 ENCOUNTER — Ambulatory Visit: Payer: PPO | Admitting: Podiatry

## 2016-08-24 ENCOUNTER — Ambulatory Visit
Admission: RE | Admit: 2016-08-24 | Discharge: 2016-08-24 | Disposition: A | Discharge status: Home / Self Care | Payer: PPO | Source: Ambulatory Visit | Attending: Family Medicine | Admitting: Family Medicine

## 2016-08-24 ENCOUNTER — Encounter: Payer: Self-pay | Admitting: Family Medicine

## 2016-08-24 ENCOUNTER — Ambulatory Visit (INDEPENDENT_AMBULATORY_CARE_PROVIDER_SITE_OTHER): Payer: PPO | Admitting: Family Medicine

## 2016-08-24 VITALS — BP 160/88 | HR 63 | Temp 98.2°F | Resp 16 | Ht 65.0 in | Wt 171.6 lb

## 2016-08-24 DIAGNOSIS — M62830 Muscle spasm of back: Secondary | ICD-10-CM | POA: Insufficient documentation

## 2016-08-24 DIAGNOSIS — M545 Low back pain: Secondary | ICD-10-CM | POA: Diagnosis not present

## 2016-08-24 DIAGNOSIS — Z23 Encounter for immunization: Secondary | ICD-10-CM

## 2016-08-24 DIAGNOSIS — R030 Elevated blood-pressure reading, without diagnosis of hypertension: Secondary | ICD-10-CM

## 2016-08-24 MED ORDER — TIZANIDINE HCL 2 MG PO TABS: 2.0000 mg | ORAL_TABLET | Freq: Three times a day (TID) | ORAL | 0 refills | Status: DC | PRN
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
## 2016-08-26 ENCOUNTER — Ambulatory Visit: Payer: PPO | Admitting: Family Medicine

## 2016-08-30 ENCOUNTER — Other Ambulatory Visit: Payer: Self-pay | Admitting: Family Medicine

## 2016-09-01 ENCOUNTER — Ambulatory Visit: Payer: PPO | Admitting: Family Medicine

## 2016-09-21 ENCOUNTER — Ambulatory Visit: Payer: PPO | Admitting: Podiatry

## 2016-09-24 ENCOUNTER — Ambulatory Visit: Payer: PPO | Admitting: Family Medicine

## 2016-09-24 ENCOUNTER — Other Ambulatory Visit: Payer: Self-pay | Admitting: Family Medicine

## 2016-09-25 ENCOUNTER — Other Ambulatory Visit: Payer: Self-pay | Admitting: Family Medicine

## 2016-09-25 DIAGNOSIS — M542 Cervicalgia: Secondary | ICD-10-CM

## 2016-09-25 DIAGNOSIS — G8929 Other chronic pain: Secondary | ICD-10-CM

## 2016-09-26 ENCOUNTER — Other Ambulatory Visit: Payer: Self-pay | Admitting: Family Medicine

## 2016-09-26 DIAGNOSIS — F32A Depression, unspecified: Secondary | ICD-10-CM

## 2016-09-26 DIAGNOSIS — F329 Major depressive disorder, single episode, unspecified: Secondary | ICD-10-CM

## 2016-09-26 DIAGNOSIS — F419 Anxiety disorder, unspecified: Principal | ICD-10-CM

## 2016-09-27 ENCOUNTER — Emergency Department: Payer: PPO

## 2016-09-27 ENCOUNTER — Encounter: Payer: Self-pay | Admitting: Emergency Medicine

## 2016-09-27 ENCOUNTER — Emergency Department
Admission: EM | Admit: 2016-09-27 | Discharge: 2016-09-27 | Disposition: A | Discharge status: Home / Self Care | Payer: PPO | Attending: Emergency Medicine | Admitting: Emergency Medicine

## 2016-09-27 ENCOUNTER — Ambulatory Visit
Admission: RE | Admit: 2016-09-27 | Discharge: 2016-09-27 | Disposition: A | Discharge status: Home / Self Care | Payer: PPO | Source: Ambulatory Visit | Attending: Family Medicine | Admitting: Family Medicine

## 2016-09-27 DIAGNOSIS — Z1239 Encounter for other screening for malignant neoplasm of breast: Secondary | ICD-10-CM

## 2016-09-27 DIAGNOSIS — K2970 Gastritis, unspecified, without bleeding: Secondary | ICD-10-CM | POA: Insufficient documentation

## 2016-09-27 DIAGNOSIS — R079 Chest pain, unspecified: Secondary | ICD-10-CM

## 2016-09-27 DIAGNOSIS — Z1231 Encounter for screening mammogram for malignant neoplasm of breast: Secondary | ICD-10-CM | POA: Insufficient documentation

## 2016-09-27 DIAGNOSIS — K2951 Unspecified chronic gastritis with bleeding: Secondary | ICD-10-CM | POA: Diagnosis not present

## 2016-09-27 DIAGNOSIS — F17210 Nicotine dependence, cigarettes, uncomplicated: Secondary | ICD-10-CM | POA: Insufficient documentation

## 2016-09-27 DIAGNOSIS — Z7982 Long term (current) use of aspirin: Secondary | ICD-10-CM | POA: Insufficient documentation

## 2016-09-27 DIAGNOSIS — Z79899 Other long term (current) drug therapy: Secondary | ICD-10-CM | POA: Diagnosis not present

## 2016-09-27 DIAGNOSIS — J45909 Unspecified asthma, uncomplicated: Secondary | ICD-10-CM | POA: Diagnosis not present

## 2016-09-27 DIAGNOSIS — R05 Cough: Secondary | ICD-10-CM | POA: Diagnosis not present

## 2016-09-27 DIAGNOSIS — R0602 Shortness of breath: Secondary | ICD-10-CM | POA: Diagnosis not present

## 2016-09-27 LAB — BASIC METABOLIC PANEL
Anion gap: 7 (ref 5–15)
BUN: 9 mg/dL (ref 6–20)
CO2: 23 mmol/L (ref 22–32)
Calcium: 9.3 mg/dL (ref 8.9–10.3)
Chloride: 106 mmol/L (ref 101–111)
Creatinine, Ser: 0.69 mg/dL (ref 0.44–1.00)
GFR calc Af Amer: >60 mL/min (ref >60)
GFR calc non Af Amer: >60 mL/min (ref >60)
Glucose, Bld: 127 mg/dL — ABNORMAL HIGH (ref 65–99)
Potassium: 3.7 mmol/L (ref 3.5–5.1)
Sodium: 136 mmol/L (ref 135–145)

## 2016-09-27 LAB — TROPONIN I: Troponin I: <0.03 ng/mL (ref <0.03)

## 2016-09-27 LAB — CBC
HCT: 43.2 % (ref 35.0–47.0)
Hemoglobin: 14.5 g/dL (ref 12.0–16.0)
MCH: 29.5 pg (ref 26.0–34.0)
MCHC: 33.6 g/dL (ref 32.0–36.0)
MCV: 87.7 fL (ref 80.0–100.0)
Platelets: 203 10*3/uL (ref 150–440)
RBC: 4.92 MIL/uL (ref 3.80–5.20)
RDW: 14.2 % (ref 11.5–14.5)
WBC: 7.5 10*3/uL (ref 3.6–11.0)

## 2016-09-27 MED ORDER — SUCRALFATE 1 G PO TABS: 1.0000 g | ORAL_TABLET | Freq: Four times a day (QID) | ORAL | 0 refills | Status: DC

## 2016-09-27 MED ORDER — GI COCKTAIL ~~LOC~~
30.0000 mL | Freq: Once | ORAL | Status: AC
  Administered 2016-09-27: 30 mL via ORAL
  Filled 2016-09-27: qty 30

## 2016-09-27 NOTE — ED Notes (Signed)
Pt presents with chest pain x 1 week that she thought was "gas." She states that this morning the pain became worse and that she had a shooting pain in her left leg, as well as numbness in both arms, which she has periodically. Pt thinks that this may have something to do with taking Chantix, which gave her sob.Pt alert & oriented with NAD noted.

## 2016-09-27 NOTE — ED Triage Notes (Signed)
chrest pain on and off since last week.  Got sharper today.

## 2016-09-27 NOTE — ED Notes (Signed)
Pt discharged home after verbalizing understanding of discharge instructions; nad noted. 

## 2016-09-27 NOTE — Discharge Instructions (Signed)
Please seek medical attention for any high fevers, chest pain, shortness of breath, change in behavior, persistent vomiting, bloody stool or any other new or concerning symptoms.  

## 2016-09-27 NOTE — ED Provider Notes (Signed)
[facility] Emergency Department Provider Note   ____________________________________________   I have reviewed the triage vital signs and the nursing notes.   HISTORY  Chief Complaint Chest Pain   History limited by: Not Limited   HPI Janice Wilson is a 61 y.o. female who presents to the emergency department today because of concerns for chest pain. She states that for the past couple of weeks she has been having some intermittent chest pain. She has additionally had some intermittent shortness of breath. Chest pain is located centrally. She describes as sharp like she is been stuck with needles. This morning. By the time my examination it had eased off. Patient denies any fevers.   Past Medical History:  Diagnosis Date  . Anxiety   . Asthma   . Dental crown present    dental implants - top, front  . Depression   . Glaucoma   . Headache(784.0)   . Heart murmur    followed by PCP  . Neck pain    s/p fusion.  limited side-to-side motion.  No limits up and down motion.  . Panic attack   . Shortness of breath   . Sleep apnea    uses CPAP machine sleep study 9/ 2012 done at sleep med in [place]  . Vertigo    no episodes in over 10 yrs  . Wears dentures    partial upper  . Wears hearing aid    left ear    Patient Active Problem List   Diagnosis Date Noted  . Encounter for screening for HIV 05/17/2016  . Encounter for hepatitis C screening test for low risk patient 05/17/2016  . Special screening for malignant neoplasms, colon   . Melanosis of colon   . Hyperlipidemia 09/15/2015  . Encounter for screening colonoscopy 09/15/2015  . Overweight (BMI 25.0-29.9) 06/17/2015  . Mass of left side of neck 06/17/2015  . Apnea, sleep 04/15/2015  . Airway hyperreactivity 04/15/2015  . Abnormal breath sounds 04/15/2015  . Gonalgia 04/15/2015  . Cervical disc disease 04/15/2015  . Blood pressure elevated 04/15/2015  . Dysfunction of eustachian  tube 04/15/2015  . Contact with or exposure to sexually transmitted disease 04/15/2015  . Compulsive tobacco user syndrome 04/15/2015  . Annual physical exam 02/04/2015  . Foot pain, right 01/02/2015  . Clinical depression 01/02/2015  . Blood pressure elevated without history of HTN 01/02/2015  . HLD (hyperlipidemia) 01/02/2015  . Anxiety and depression 01/02/2015  . Chronic cervical pain 01/02/2015  . Anxiety 01/02/2015  . Cervical pain 01/02/2015  . Cannot sleep 01/02/2015  . Bladder cystocele 07/31/2014  . Pain 07/31/2014  . Hair loss 08/28/2013  . Cervical spinal stenosis 07/05/2011    Past Surgical History:  Procedure Laterality Date  . ANTERIOR CERVICAL DECOMP/DISCECTOMY FUSION  07/05/2011   Procedure: ANTERIOR CERVICAL DECOMPRESSION/DISCECTOMY FUSION 3 LEVELS;  Surgeon: Cooper Render Pool;  Location: [place] [facility];  Service: Neurosurgery;  Laterality: N/A;  Cervical Four-Five, Cervical Five-Six, Cervical Six-Seven Anterior Cervical Decompression Fusion WITH ALLOGRAFT AND PLATING  . CESAREAN SECTION    . COLONOSCOPY WITH PROPOFOL N/A 04/16/2016   Procedure: COLONOSCOPY WITH PROPOFOL;  Surgeon: Lucilla Lame, MD;  Location: [place];  Service: Endoscopy;  Laterality: N/A;  sleep apnea LATEX sensitivity  . PARTIAL HYSTERECTOMY      Prior to Admission medications   Medication Sig Start Date End Date Taking? Authorizing Provider  acetaminophen (TYLENOL) 325 MG tablet Take 325 mg by mouth every 6 (six) hours  as needed. For pain     Historical Provider, MD  albuterol (PROVENTIL HFA;VENTOLIN HFA) 108 (90 BASE) MCG/ACT inhaler Inhale 1 puff into the lungs every 4 (four) hours as needed. For shortness of breath    Historical Provider, MD  aspirin 81 MG tablet Take 81 mg by mouth daily.    Historical Provider, MD  conjugated estrogens (PREMARIN) vaginal cream Place vaginally. 08/14/15   Historical Provider, MD  fluticasone (FLONASE) 50 MCG/ACT nasal spray Place 2 sprays into the nose  daily as needed. FOR CONGESTION     Historical Provider, MD  Fluticasone-Salmeterol (ADVAIR) 250-50 MCG/DOSE AEPB Inhale 1 puff into the lungs every 12 (twelve) hours as needed. FOR WHEEZING     Historical Provider, MD  gabapentin (NEURONTIN) 300 MG capsule TAKE ONE CAPSULE BY MOUTH 3 TIMES A DAY 09/27/16   Roselee Nova, MD  ibuprofen (ADVIL,MOTRIN) 800 MG tablet Take 800 mg by mouth daily as needed. FOR PAIN     Historical Provider, MD  latanoprost (XALATAN) 0.005 % ophthalmic solution Place 1 drop into both eyes at bedtime.      Historical Provider, MD  loratadine (CLARITIN) 10 MG tablet Take 10 mg by mouth at bedtime.    Historical Provider, MD  LORazepam (ATIVAN) 0.5 MG tablet TAKE 1 TABLET BY MOUTH EVERY NIGHT AT BEDTIME 09/27/16   Roselee Nova, MD  meclizine (ANTIVERT) 25 MG tablet Take 25 mg by mouth 3 (three) times daily as needed. Reported on 07/03/2015    Historical Provider, MD  meloxicam (MOBIC) 15 MG tablet Take 1 tablet (15 mg total) by mouth daily. 01/29/15   Max T Hyatt, DPM  montelukast (SINGULAIR) 10 MG tablet Take 1 tablet (10 mg total) by mouth at bedtime. 03/17/16   Roselee Nova, MD  Multiple Vitamins-Minerals (MULTIPLE VITAMINS/WOMENS PO) Take 1 tablet by mouth daily.    Historical Provider, MD  mupirocin cream (BACTROBAN) 2 % Apply 1 application topically 2 (two) times daily. 08/10/16   Roselee Nova, MD  rosuvastatin (CRESTOR) 5 MG tablet TAKE 1 TABLET (5 MG TOTAL) BY MOUTH AT BEDTIME. 09/27/16   Roselee Nova, MD  sertraline (ZOLOFT) 50 MG tablet TAKE 1 TABLET BY MOUTH AT BEDTIME 02/24/16   Roselee Nova, MD  timolol (BETIMOL) 0.5 % ophthalmic solution Place 1 drop into both eyes every morning.      Historical Provider, MD  tiZANidine (ZANAFLEX) 2 MG tablet Take 1 tablet (2 mg total) by mouth every 8 (eight) hours as needed for muscle spasms. 08/24/16   Roselee Nova, MD  varenicline (CHANTIX STARTING MONTH PAK) 0.5 MG X 11 & 1 MG X 42 tablet Take one 0.5 mg tablet  by mouth once daily for 3 days, then increase to one 0.5 mg tablet twice daily for 4 days, then increase to one 1 mg tablet twice daily. 05/17/16   Arnetha Courser, MD  Vitamin D, Ergocalciferol, (DRISDOL) 50000 units CAPS capsule Take 1 capsule (50,000 Units total) by mouth once a week. For 12 weeks 08/11/16   Roselee Nova, MD    Allergies Oxycodone hcl; Propoxyphene; and Latex  Family History  Problem Relation Age of Onset  . Cancer Mother   . Stroke Father   . Anesthesia problems Neg Hx   . Breast cancer Neg Hx     Social History Social History  Substance Use Topics  . Smoking status: Current Some Day Smoker  Packs/day: 0.20    Years: 21.00    Types: Cigarettes    Last attempt to quit: 02/19/2013  . Smokeless tobacco: Never Used  . Alcohol use 0.0 oz/week     Comment: 1-2 beer a month(Holidays)    Review of Systems  Constitutional: Negative for fever. Cardiovascular: Positive for chest pain. Respiratory: Positive for shortness of breath. Gastrointestinal: Negative for abdominal pain, vomiting and diarrhea. Genitourinary: Negative for dysuria. Musculoskeletal: Negative for back pain. Skin: Negative for rash. Neurological: Negative for headaches, focal weakness or numbness.  10-point ROS otherwise negative.  ____________________________________________   PHYSICAL EXAM:  VITAL SIGNS: ED Triage Vitals [09/27/16 1259]  Enc Vitals Group     BP (!) 160/96     Pulse Rate (!) 56     Resp 15     Temp      Temp src      SpO2 98 %   Constitutional: Alert and oriented. Well appearing and in no distress. Eyes: Conjunctivae are normal. Normal extraocular movements. ENT   Head: Normocephalic and atraumatic.   Nose: No congestion/rhinnorhea.   Mouth/Throat: Mucous membranes are moist.   Neck: No stridor. Hematological/Lymphatic/Immunilogical: No cervical lymphadenopathy. Cardiovascular: Normal rate, regular rhythm.  No murmurs, rubs, or gallops.   Respiratory: Normal respiratory effort without tachypnea nor retractions. Breath sounds are clear and equal bilaterally. No wheezes/rales/rhonchi. Gastrointestinal: Soft and non tender. No rebound. No guarding.  Genitourinary: Deferred Musculoskeletal: Normal range of motion in all extremities. No lower extremity edema. Neurologic:  Normal speech and language. No gross focal neurologic deficits are appreciated.  Skin:  Skin is warm, dry and intact. No rash noted. Psychiatric: Mood and affect are normal. Speech and behavior are normal. Patient exhibits appropriate insight and judgment.  ____________________________________________    LABS (pertinent positives/negatives)  Labs Reviewed  BASIC METABOLIC PANEL - Abnormal; Notable for the following:       Result Value   Glucose, Bld 127 (*)    All other components within normal limits  CBC  TROPONIN I   ____________________________________________   EKG  I, Nance Pear, attending physician, personally viewed and interpreted this EKG  EKG Time: 1300 Rate: 52 Rhythm: sinus bradycardia Axis: normal Intervals: qtc 375 QRS: narrow, q waves V1 ST changes: no st elevation Impression: abnormal ekg   ____________________________________________    RADIOLOGY  CXR  IMPRESSION: There is no acute cardiopulmonary abnormality.   ____________________________________________   PROCEDURES  Procedures  ____________________________________________   INITIAL IMPRESSION / ASSESSMENT AND PLAN / ED COURSE  Pertinent labs & imaging results that were available during my care of the patient were reviewed by me and considered in my medical decision making (see chart for details).  Patient presented to the emergency department today with concerns for some chest pain. This has been going on and off. Patient's blood work without any concerning findings, EKG and chest x-ray without any findings concerning for heart ischemia. Patient  was given GI cocktail and felt good relief from this. At this point I think esophagitis likely. Will plan on discharging home with syncope. Patient is already on an antiacid.  ____________________________________________   FINAL CLINICAL IMPRESSION(S) / ED DIAGNOSES  Final diagnoses:  Nonspecific chest pain  Gastritis, presence of bleeding unspecified, unspecified chronicity, unspecified gastritis type     Note: This dictation was prepared with Dragon dictation. Any transcriptional errors that result from this process are unintentional     Nance Pear, MD 09/27/16 [facility] Emerald

## 2016-09-29 ENCOUNTER — Telehealth: Payer: Self-pay | Admitting: Family Medicine

## 2016-09-29 NOTE — Telephone Encounter (Signed)
Pt requesting refill on rosuvastatin. Please send to [email or web address].please return pt call once done [phone]

## 2016-09-30 NOTE — Telephone Encounter (Signed)
Script was sent to [facility] on 4/9 for 90 day supply of Crestor.

## 2016-10-08 ENCOUNTER — Ambulatory Visit: Payer: PPO | Admitting: Family Medicine

## 2016-10-11 ENCOUNTER — Encounter: Payer: Self-pay | Admitting: Family Medicine

## 2016-10-11 ENCOUNTER — Ambulatory Visit (INDEPENDENT_AMBULATORY_CARE_PROVIDER_SITE_OTHER): Payer: PPO | Admitting: Family Medicine

## 2016-10-11 VITALS — BP 158/74 | HR 67 | Temp 98.9°F | Resp 16 | Ht 65.0 in | Wt 175.7 lb

## 2016-10-11 DIAGNOSIS — K2970 Gastritis, unspecified, without bleeding: Secondary | ICD-10-CM | POA: Diagnosis not present

## 2016-10-11 DIAGNOSIS — I10 Essential (primary) hypertension: Secondary | ICD-10-CM

## 2016-10-11 MED ORDER — SUCRALFATE 1 G PO TABS: 1.0000 g | ORAL_TABLET | Freq: Four times a day (QID) | ORAL | 0 refills | Status: DC

## 2016-10-11 MED ORDER — LISINOPRIL 5 MG PO TABS: 5.0000 mg | ORAL_TABLET | Freq: Every day | ORAL | 0 refills | Status: DC

## 2016-10-11 NOTE — Progress Notes (Signed)
Name: Janice Wilson   MRN: [id number]    DOB: April 08, 1956   Date:10/11/2016       Progress Note  Subjective  Chief Complaint  Chief Complaint  Patient presents with  . Hospitalization Follow-up    BP / Chest pain    Hypertension  This is a recurrent problem. The problem has been gradually worsening since onset. The problem is uncontrolled. Pertinent negatives include no blurred vision, chest pain or headaches. There are no known risk factors for coronary artery disease. Past treatments include nothing. There is no history of kidney disease or CAD/MI.   She was seen in the ER over the weekend with chest pain, thought to be coming from gastritis after initial lab work and imaging were unremarkable, she has been started on sucralfate, which seems to be working well.  Past Medical History:  Diagnosis Date  . Anxiety   . Asthma   . Dental crown present    dental implants - top, front  . Depression   . Glaucoma   . Headache(784.0)   . Heart murmur    followed by PCP  . Neck pain    s/p fusion.  limited side-to-side motion.  No limits up and down motion.  . Panic attack   . Shortness of breath   . Sleep apnea    uses CPAP machine sleep study 9/ 2012 done at sleep med in [place]  . Vertigo    no episodes in over 10 yrs  . Wears dentures    partial upper  . Wears hearing aid    left ear    Past Surgical History:  Procedure Laterality Date  . ANTERIOR CERVICAL DECOMP/DISCECTOMY FUSION  07/05/2011   Procedure: ANTERIOR CERVICAL DECOMPRESSION/DISCECTOMY FUSION 3 LEVELS;  Surgeon: Cooper Render Pool;  Location: [facility];  Service: Neurosurgery;  Laterality: N/A;  Cervical Four-Five, Cervical Five-Six, Cervical Six-Seven Anterior Cervical Decompression Fusion WITH ALLOGRAFT AND PLATING  . CESAREAN SECTION    . COLONOSCOPY WITH PROPOFOL N/A 04/16/2016   Procedure: COLONOSCOPY WITH PROPOFOL;  Surgeon: Lucilla Lame, MD;  Location: [facility];  Service: Endoscopy;  Laterality:  N/A;  sleep apnea LATEX sensitivity  . PARTIAL HYSTERECTOMY      Family History  Problem Relation Age of Onset  . Cancer Mother   . Stroke Father   . Anesthesia problems Neg Hx   . Breast cancer Neg Hx     Social History   Social History  . Marital status: Single    Spouse name: N/A  . Number of children: N/A  . Years of education: N/A   Occupational History  . Not on file.   Social History Main Topics  . Smoking status: Current Some Day Smoker    Packs/day: 0.20    Years: 21.00    Types: Cigarettes    Last attempt to quit: 02/19/2013  . Smokeless tobacco: Never Used  . Alcohol use 0.0 oz/week     Comment: 1-2 beer a month(Holidays)  . Drug use: No  . Sexual activity: Not on file   Other Topics Concern  . Not on file   Social History Narrative  . No narrative on file     Current Outpatient Prescriptions:  .  acetaminophen (TYLENOL) 325 MG tablet, Take 325 mg by mouth every 6 (six) hours as needed. For pain , Disp: , Rfl:  .  albuterol (PROVENTIL HFA;VENTOLIN HFA) 108 (90 BASE) MCG/ACT inhaler, Inhale 1 puff into the lungs every 4 (four) hours  as needed. For shortness of breath, Disp: , Rfl:  .  aspirin 81 MG tablet, Take 81 mg by mouth daily., Disp: , Rfl:  .  conjugated estrogens (PREMARIN) vaginal cream, Place vaginally., Disp: , Rfl:  .  fluticasone (FLONASE) 50 MCG/ACT nasal spray, Place 2 sprays into the nose daily as needed. FOR CONGESTION , Disp: , Rfl:  .  Fluticasone-Salmeterol (ADVAIR) 250-50 MCG/DOSE AEPB, Inhale 1 puff into the lungs every 12 (twelve) hours as needed. FOR WHEEZING , Disp: , Rfl:  .  gabapentin (NEURONTIN) 300 MG capsule, TAKE ONE CAPSULE BY MOUTH 3 TIMES A DAY, Disp: 90 capsule, Rfl: 2 .  ibuprofen (ADVIL,MOTRIN) 800 MG tablet, Take 800 mg by mouth daily as needed. FOR PAIN , Disp: , Rfl:  .  latanoprost (XALATAN) 0.005 % ophthalmic solution, Place 1 drop into both eyes at bedtime.  , Disp: , Rfl:  .  loratadine (CLARITIN) 10 MG tablet,  Take 10 mg by mouth at bedtime., Disp: , Rfl:  .  LORazepam (ATIVAN) 0.5 MG tablet, TAKE 1 TABLET BY MOUTH EVERY NIGHT AT BEDTIME, Disp: 30 tablet, Rfl: 2 .  meclizine (ANTIVERT) 25 MG tablet, Take 25 mg by mouth 3 (three) times daily as needed. Reported on 07/03/2015, Disp: , Rfl:  .  meloxicam (MOBIC) 15 MG tablet, Take 1 tablet (15 mg total) by mouth daily., Disp: 30 tablet, Rfl: 2 .  montelukast (SINGULAIR) 10 MG tablet, Take 1 tablet (10 mg total) by mouth at bedtime., Disp: 90 tablet, Rfl: 1 .  Multiple Vitamins-Minerals (MULTIPLE VITAMINS/WOMENS PO), Take 1 tablet by mouth daily., Disp: , Rfl:  .  mupirocin cream (BACTROBAN) 2 %, Apply 1 application topically 2 (two) times daily., Disp: 15 g, Rfl: 0 .  rosuvastatin (CRESTOR) 5 MG tablet, TAKE 1 TABLET (5 MG TOTAL) BY MOUTH AT BEDTIME., Disp: 90 tablet, Rfl: 0 .  sertraline (ZOLOFT) 50 MG tablet, TAKE 1 TABLET BY MOUTH AT BEDTIME, Disp: 90 tablet, Rfl: 1 .  sucralfate (CARAFATE) 1 g tablet, Take 1 tablet (1 g total) by mouth 4 (four) times daily., Disp: 60 tablet, Rfl: 0 .  timolol (BETIMOL) 0.5 % ophthalmic solution, Place 1 drop into both eyes every morning.  , Disp: , Rfl:  .  tiZANidine (ZANAFLEX) 2 MG tablet, Take 1 tablet (2 mg total) by mouth every 8 (eight) hours as needed for muscle spasms., Disp: 30 tablet, Rfl: 0 .  varenicline (CHANTIX STARTING MONTH PAK) 0.5 MG X 11 & 1 MG X 42 tablet, Take one 0.5 mg tablet by mouth once daily for 3 days, then increase to one 0.5 mg tablet twice daily for 4 days, then increase to one 1 mg tablet twice daily., Disp: 53 tablet, Rfl: 0 .  Vitamin D, Ergocalciferol, (DRISDOL) 50000 units CAPS capsule, Take 1 capsule (50,000 Units total) by mouth once a week. For 12 weeks, Disp: 12 capsule, Rfl: 0  Allergies  Allergen Reactions  . Oxycodone Hcl Other (See Comments)    REACTION:"MAKES ME PALE"  . Propoxyphene Nausea And Vomiting  . Latex Rash    Irritation  (Condoms and gloves)     Review of  Systems  Eyes: Negative for blurred vision.  Cardiovascular: Negative for chest pain.  Neurological: Negative for headaches.      Objective  Vitals:   10/11/16 1516  BP: (!) 158/74  Pulse: 67  Resp: 16  Temp: 98.9 F (37.2 C)  TempSrc: Oral  SpO2: 97%  Weight: 175 lb 11.2 oz (  79.7 kg)  Height: 5\' 5"  (1.651 m)    Physical Exam  Constitutional: She is well-developed, well-nourished, and in no distress.  Cardiovascular: Normal rate, regular rhythm, S1 normal and S2 normal.   Murmur heard.  Systolic murmur is present with a grade of 2/6  Pulmonary/Chest: Effort normal and breath sounds normal. No respiratory distress. She has no wheezes. She has no rhonchi.  Abdominal: Soft. Bowel sounds are normal. There is no tenderness.  Musculoskeletal:       Right ankle: She exhibits no swelling.       Left ankle: She exhibits no swelling.  Psychiatric: Mood, memory, affect and judgment normal.  Nursing note and vitals reviewed.      Assessment & Plan  1. Essential hypertension Pressures persistently elevated on manual repeat, started on lisinopril 5 mg daily, advised to keep BP logs and review in 4 weeks - lisinopril (PRINIVIL,ZESTRIL) 5 MG tablet; Take 1 tablet (5 mg total) by mouth daily.  Dispense: 90 tablet; Refill: 0  2. Gastritis without bleeding, unspecified chronicity, unspecified gastritis type Reviewed ER visit note, continue on sucralfate, follow-up in one month and may start on pantoprazole, or referral to gastroenterology for endoscopy - sucralfate (CARAFATE) 1 g tablet; Take 1 tablet (1 g total) by mouth 4 (four) times daily.  Dispense: 60 tablet; Refill: 0   Roxy Filler [facility] 10/11/2016 3:24 PM

## 2016-10-13 ENCOUNTER — Telehealth: Payer: Self-pay | Admitting: Family Medicine

## 2016-10-13 DIAGNOSIS — L089 Local infection of the skin and subcutaneous tissue, unspecified: Secondary | ICD-10-CM

## 2016-10-13 DIAGNOSIS — B9689 Other specified bacterial agents as the cause of diseases classified elsewhere: Secondary | ICD-10-CM

## 2016-10-13 MED ORDER — MUPIROCIN 2 % EX OINT: 1.0000 "application " | TOPICAL_OINTMENT | Freq: Two times a day (BID) | CUTANEOUS | 0 refills | Status: DC

## 2016-10-13 NOTE — Telephone Encounter (Signed)
Pt verbally informed. °

## 2016-10-13 NOTE — Telephone Encounter (Signed)
Pt was prescribed mupirocin 2% cream and states that it does not work as good as the ointment (in which you had prescribed to her before on 03/06/13). Asking that you please send the ointment to [facility]

## 2016-10-13 NOTE — Telephone Encounter (Signed)
Change to mupirocin topical ointment, prescription sent to pharmacy

## 2016-10-14 ENCOUNTER — Other Ambulatory Visit: Payer: Self-pay | Admitting: Family Medicine

## 2016-10-22 ENCOUNTER — Ambulatory Visit: Payer: PPO | Admitting: Podiatry

## 2016-11-03 ENCOUNTER — Other Ambulatory Visit: Payer: Self-pay | Admitting: Family Medicine

## 2016-11-07 ENCOUNTER — Other Ambulatory Visit: Payer: Self-pay | Admitting: Family Medicine

## 2016-11-07 DIAGNOSIS — K2970 Gastritis, unspecified, without bleeding: Secondary | ICD-10-CM

## 2016-11-10 ENCOUNTER — Ambulatory Visit (INDEPENDENT_AMBULATORY_CARE_PROVIDER_SITE_OTHER): Payer: PPO | Admitting: Family Medicine

## 2016-11-10 ENCOUNTER — Encounter: Payer: Self-pay | Admitting: Gastroenterology

## 2016-11-10 ENCOUNTER — Encounter: Payer: Self-pay | Admitting: Family Medicine

## 2016-11-10 VITALS — BP 140/80 | HR 75 | Temp 99.0°F | Resp 16 | Ht 65.0 in | Wt 167.8 lb

## 2016-11-10 DIAGNOSIS — I10 Essential (primary) hypertension: Secondary | ICD-10-CM

## 2016-11-10 DIAGNOSIS — Z720 Tobacco use: Secondary | ICD-10-CM

## 2016-11-10 DIAGNOSIS — K219 Gastro-esophageal reflux disease without esophagitis: Secondary | ICD-10-CM | POA: Diagnosis not present

## 2016-11-10 MED ORDER — BUPROPION HCL ER (SMOKING DET) 150 MG PO TB12: 150.0000 mg | ORAL_TABLET | Freq: Two times a day (BID) | ORAL | 0 refills | Status: AC

## 2016-11-10 MED ORDER — LISINOPRIL 5 MG PO TABS: 5.0000 mg | ORAL_TABLET | Freq: Every day | ORAL | 0 refills | Status: DC

## 2016-11-10 NOTE — Progress Notes (Signed)
Name: Janice Wilson   MRN: [id number]    DOB: Oct 09, 1955   Date:11/10/2016       Progress Note  Subjective  Chief Complaint  Chief Complaint  Patient presents with  . Follow-up    1 mo BP    Hypertension  This is a chronic problem. The problem has been gradually improving since onset. The problem is controlled. Pertinent negatives include no blurred vision, chest pain, headaches, orthopnea or palpitations. Past treatments include ACE inhibitors. There is no history of kidney disease, CAD/MI or CVA.  Nicotine Dependence  Presents for initial visit. Symptoms include cravings. Preferred tobacco types include cigarettes. Preferred brands include [place]. Her urge triggers include company of smokers, drinking coffee, meal time and stress. Time of day of first tobacco use: 7 AM after she wakes up. She smokes < 1/2 a pack of cigarettes per day. She started smoking when she was >18 (70) years old. Past treatments include varenicline and nicotine patch. Deserie is ready to quit. Jamilett has tried to quit 3 times.   Patient has been diagnosed with esophagitis/gastritis when she presented to the ED with complaints of chest pain last month. She has been prescribed a GI cocktail which she has been taking since last month. She reports having issues with acid production and has taken OTC antacids before. She will need a referral to GI to evaluate for possible esophagitis.   Past Medical History:  Diagnosis Date  . Anxiety   . Asthma   . Dental crown present    dental implants - top, front  . Depression   . Glaucoma   . Headache(784.0)   . Heart murmur    followed by PCP  . Neck pain    s/p fusion.  limited side-to-side motion.  No limits up and down motion.  . Panic attack   . Shortness of breath   . Sleep apnea    uses CPAP machine sleep study 9/ 2012 done at sleep med in [place]  . Vertigo    no episodes in over 10 yrs  . Wears dentures    partial upper  . Wears hearing aid    left  ear    Past Surgical History:  Procedure Laterality Date  . ANTERIOR CERVICAL DECOMP/DISCECTOMY FUSION  07/05/2011   Procedure: ANTERIOR CERVICAL DECOMPRESSION/DISCECTOMY FUSION 3 LEVELS;  Surgeon: Cooper Render Pool;  Location: [facility];  Service: Neurosurgery;  Laterality: N/A;  Cervical Four-Five, Cervical Five-Six, Cervical Six-Seven Anterior Cervical Decompression Fusion WITH ALLOGRAFT AND PLATING  . CESAREAN SECTION    . COLONOSCOPY WITH PROPOFOL N/A 04/16/2016   Procedure: COLONOSCOPY WITH PROPOFOL;  Surgeon: Lucilla Lame, MD;  Location: [facility];  Service: Endoscopy;  Laterality: N/A;  sleep apnea LATEX sensitivity  . PARTIAL HYSTERECTOMY      Family History  Problem Relation Age of Onset  . Cancer Mother   . Stroke Father   . Anesthesia problems Neg Hx   . Breast cancer Neg Hx     Social History   Social History  . Marital status: Single    Spouse name: N/A  . Number of children: N/A  . Years of education: N/A   Occupational History  . Not on file.   Social History Main Topics  . Smoking status: Current Some Day Smoker    Packs/day: 0.20    Years: 21.00    Types: Cigarettes    Last attempt to quit: 02/19/2013  . Smokeless tobacco: Never Used  . Alcohol  use 0.0 oz/week     Comment: 1-2 beer a month(Holidays)  . Drug use: No  . Sexual activity: Not on file   Other Topics Concern  . Not on file   Social History Narrative  . No narrative on file     Current Outpatient Prescriptions:  .  acetaminophen (TYLENOL) 325 MG tablet, Take 325 mg by mouth every 6 (six) hours as needed. For pain , Disp: , Rfl:  .  albuterol (PROVENTIL HFA;VENTOLIN HFA) 108 (90 BASE) MCG/ACT inhaler, Inhale 1 puff into the lungs every 4 (four) hours as needed. For shortness of breath, Disp: , Rfl:  .  aspirin 81 MG tablet, Take 81 mg by mouth daily., Disp: , Rfl:  .  conjugated estrogens (PREMARIN) vaginal cream, Place vaginally., Disp: , Rfl:  .  fluticasone (FLONASE) 50  MCG/ACT nasal spray, Place 2 sprays into the nose daily as needed. FOR CONGESTION , Disp: , Rfl:  .  Fluticasone-Salmeterol (ADVAIR) 250-50 MCG/DOSE AEPB, Inhale 1 puff into the lungs every 12 (twelve) hours as needed. FOR WHEEZING , Disp: , Rfl:  .  gabapentin (NEURONTIN) 300 MG capsule, TAKE ONE CAPSULE BY MOUTH 3 TIMES A DAY, Disp: 90 capsule, Rfl: 2 .  ibuprofen (ADVIL,MOTRIN) 800 MG tablet, Take 800 mg by mouth daily as needed. FOR PAIN , Disp: , Rfl:  .  latanoprost (XALATAN) 0.005 % ophthalmic solution, Place 1 drop into both eyes at bedtime.  , Disp: , Rfl:  .  lisinopril (PRINIVIL,ZESTRIL) 5 MG tablet, Take 1 tablet (5 mg total) by mouth daily., Disp: 90 tablet, Rfl: 0 .  loratadine (CLARITIN) 10 MG tablet, Take 10 mg by mouth at bedtime., Disp: , Rfl:  .  LORazepam (ATIVAN) 0.5 MG tablet, TAKE 1 TABLET BY MOUTH EVERY NIGHT AT BEDTIME, Disp: 30 tablet, Rfl: 2 .  meclizine (ANTIVERT) 25 MG tablet, Take 25 mg by mouth 3 (three) times daily as needed. Reported on 07/03/2015, Disp: , Rfl:  .  meloxicam (MOBIC) 15 MG tablet, Take 1 tablet (15 mg total) by mouth daily., Disp: 30 tablet, Rfl: 2 .  montelukast (SINGULAIR) 10 MG tablet, Take 1 tablet (10 mg total) by mouth at bedtime., Disp: 90 tablet, Rfl: 1 .  Multiple Vitamins-Minerals (MULTIPLE VITAMINS/WOMENS PO), Take 1 tablet by mouth daily., Disp: , Rfl:  .  mupirocin ointment (BACTROBAN) 2 %, Apply 1 application topically 2 (two) times daily., Disp: 22 g, Rfl: 0 .  rosuvastatin (CRESTOR) 5 MG tablet, TAKE 1 TABLET (5 MG TOTAL) BY MOUTH AT BEDTIME., Disp: 90 tablet, Rfl: 0 .  sertraline (ZOLOFT) 50 MG tablet, TAKE 1 TABLET BY MOUTH AT BEDTIME, Disp: 90 tablet, Rfl: 1 .  sucralfate (CARAFATE) 1 g tablet, TAKE 1 TABLET BY MOUTH 4 TIMES A DAY, Disp: 60 tablet, Rfl: 0 .  timolol (BETIMOL) 0.5 % ophthalmic solution, Place 1 drop into both eyes every morning.  , Disp: , Rfl:  .  tiZANidine (ZANAFLEX) 2 MG tablet, Take 1 tablet (2 mg total) by mouth  every 8 (eight) hours as needed for muscle spasms., Disp: 30 tablet, Rfl: 0 .  varenicline (CHANTIX STARTING MONTH PAK) 0.5 MG X 11 & 1 MG X 42 tablet, Take one 0.5 mg tablet by mouth once daily for 3 days, then increase to one 0.5 mg tablet twice daily for 4 days, then increase to one 1 mg tablet twice daily., Disp: 53 tablet, Rfl: 0 .  Vitamin D, Ergocalciferol, (DRISDOL) 50000 units CAPS capsule, Take 1  capsule (50,000 Units total) by mouth once a week. For 12 weeks, Disp: 12 capsule, Rfl: 0  Allergies  Allergen Reactions  . Oxycodone Hcl Other (See Comments)    REACTION:"MAKES ME PALE"  . Propoxyphene Nausea And Vomiting  . Latex Rash    Irritation  (Condoms and gloves)     Review of Systems  Eyes: Negative for blurred vision.  Cardiovascular: Negative for chest pain, palpitations and orthopnea.  Neurological: Negative for headaches.      Objective  Vitals:   11/10/16 1052  BP: 140/80  Pulse: 75  Resp: 16  Temp: 99 F (37.2 C)  TempSrc: Oral  SpO2: 96%  Weight: 167 lb 12.8 oz (76.1 kg)  Height: 5\' 5"  (1.651 m)    Physical Exam  Constitutional: She is oriented to person, place, and time and well-developed, well-nourished, and in no distress.  HENT:  Head: Normocephalic and atraumatic.  Cardiovascular: Normal rate, regular rhythm and normal heart sounds.   No murmur heard. Pulmonary/Chest: Effort normal and breath sounds normal. She has no wheezes.  Neurological: She is alert and oriented to person, place, and time.  Psychiatric: Mood, memory, affect and judgment normal.  Nursing note and vitals reviewed.      Assessment & Plan  1. Essential hypertension BP stable on present and hypertensive therapy - lisinopril (PRINIVIL,ZESTRIL) 5 MG tablet; Take 1 tablet (5 mg total) by mouth daily.  Dispense: 90 tablet; Refill: 0  2. Tobacco abuse Started on Zyban for smoking cessation, educated on medication, should stop smoking within 5-7 days of starting treatment.  Reassess in 2-3 months - buPROPion (ZYBAN) 150 MG 12 hr tablet; Take 1 tablet (150 mg total) by mouth 2 (two) times daily. Start: 150 mg PO q day x 3 days, separate doses by at least 8hrs, last dose NLT 6 PM, stop smoking after 7 days of treatment.  Dispense: 112 tablet; Refill: 0  3. Gastroesophageal reflux disease, esophagitis presence not specified  - Ambulatory referral to Gastroenterology   Dossie Der [facility] 11/10/2016 11:00 AM

## 2016-11-19 ENCOUNTER — Telehealth: Payer: Self-pay | Admitting: Family Medicine

## 2016-12-08 ENCOUNTER — Ambulatory Visit: Payer: PPO | Admitting: Family Medicine

## 2016-12-09 ENCOUNTER — Encounter: Payer: Self-pay | Admitting: Family Medicine

## 2016-12-09 ENCOUNTER — Ambulatory Visit (INDEPENDENT_AMBULATORY_CARE_PROVIDER_SITE_OTHER): Payer: PPO | Admitting: Family Medicine

## 2016-12-09 VITALS — BP 120/84 | HR 67 | Temp 98.3°F | Resp 16 | Ht 65.0 in | Wt 162.9 lb

## 2016-12-09 DIAGNOSIS — F331 Major depressive disorder, recurrent, moderate: Secondary | ICD-10-CM

## 2016-12-09 MED ORDER — SERTRALINE HCL 100 MG PO TABS: 100.0000 mg | ORAL_TABLET | Freq: Every day | ORAL | 0 refills | Status: DC

## 2016-12-09 NOTE — Patient Instructions (Addendum)
-   Referral placed to Dr. Norma Fredrickson, Psychologist for evaluation and counseling. - Zoloft/Sertralin: Please take 1.5 of the 50mg  tablets for 7 days, then start taking 2 of the 50mg  tablets for 7 days. Then fill the 100mg  prescription, and take 1 tablet each night before bed. - Please schedule a follow up in the next 1 week with Dr. Manuella Ghazi - Please go to [facility] for evaluation today or tomorrow.

## 2016-12-09 NOTE — Progress Notes (Addendum)
Name: Janice Wilson   MRN: [id number]    DOB: [birth year]   Date:12/09/2016       Progress Note  Subjective  Chief Complaint  Chief Complaint  Patient presents with  . Panic Attack    HPI  Pt presents for anxiety and depression. She had an event at work yesterday where she says she felt discrimination and this caused an increase in her anxiety and depression. She asks to be put out of work for 2 weeks so that she can recover from her increased anxiety/depression. We had lengthy discussion about her situation and about the time off she is requesting. She denies SI or HI and contracts that if she has these thoughts she will contact emergency services. She has no other complaints today.  Patient Active Problem List   Diagnosis Date Noted  . Superficial bacterial skin infection 10/13/2016  . Hypertension 10/11/2016  . Encounter for screening for HIV 05/17/2016  . Encounter for hepatitis C screening test for low risk patient 05/17/2016  . Special screening for malignant neoplasms, colon   . Melanosis of colon   . Hyperlipidemia 09/15/2015  . Encounter for screening colonoscopy 09/15/2015  . Overweight (BMI 25.0-29.9) 06/17/2015  . Mass of left side of neck 06/17/2015  . Apnea, sleep 04/15/2015  . Airway hyperreactivity 04/15/2015  . Abnormal breath sounds 04/15/2015  . Gonalgia 04/15/2015  . Cervical disc disease 04/15/2015  . Blood pressure elevated 04/15/2015  . Dysfunction of eustachian tube 04/15/2015  . Contact with or exposure to sexually transmitted disease 04/15/2015  . Compulsive tobacco user syndrome 04/15/2015  . Annual physical exam 02/04/2015  . Foot pain, right 01/02/2015  . Clinical depression 01/02/2015  . Blood pressure elevated without history of HTN 01/02/2015  . HLD (hyperlipidemia) 01/02/2015  . Anxiety and depression 01/02/2015  . Chronic cervical pain 01/02/2015  . Anxiety 01/02/2015  . Cervical pain 01/02/2015  . Cannot sleep 01/02/2015  . Bladder  cystocele 07/31/2014  . Pain 07/31/2014  . Hair loss 08/28/2013  . Cervical spinal stenosis 07/05/2011    Social History  Substance Use Topics  . Smoking status: Current Some Day Smoker    Packs/day: 0.20    Years: 21.00    Types: Cigarettes    Last attempt to quit: 02/19/2013  . Smokeless tobacco: Never Used  . Alcohol use 0.0 oz/week     Comment: 1-2 beer a month(Holidays)     Current Outpatient Prescriptions:  .  acetaminophen (TYLENOL) 325 MG tablet, Take 325 mg by mouth every 6 (six) hours as needed. For pain , Disp: , Rfl:  .  albuterol (PROVENTIL HFA;VENTOLIN HFA) 108 (90 BASE) MCG/ACT inhaler, Inhale 1 puff into the lungs every 4 (four) hours as needed. For shortness of breath, Disp: , Rfl:  .  aspirin 81 MG tablet, Take 81 mg by mouth daily., Disp: , Rfl:  .  buPROPion (ZYBAN) 150 MG 12 hr tablet, Take 1 tablet (150 mg total) by mouth 2 (two) times daily. Start: 150 mg PO q day x 3 days, separate doses by at least 8hrs, last dose NLT 6 PM, stop smoking after 7 days of treatment., Disp: 112 tablet, Rfl: 0 .  conjugated estrogens (PREMARIN) vaginal cream, Place vaginally., Disp: , Rfl:  .  fluticasone (FLONASE) 50 MCG/ACT nasal spray, Place 2 sprays into the nose daily as needed. FOR CONGESTION , Disp: , Rfl:  .  Fluticasone-Salmeterol (ADVAIR) 250-50 MCG/DOSE AEPB, Inhale 1 puff into the lungs every 12 (twelve) hours  as needed. FOR WHEEZING , Disp: , Rfl:  .  gabapentin (NEURONTIN) 300 MG capsule, TAKE ONE CAPSULE BY MOUTH 3 TIMES A DAY, Disp: 90 capsule, Rfl: 2 .  ibuprofen (ADVIL,MOTRIN) 800 MG tablet, Take 800 mg by mouth daily as needed. FOR PAIN , Disp: , Rfl:  .  latanoprost (XALATAN) 0.005 % ophthalmic solution, Place 1 drop into both eyes at bedtime.  , Disp: , Rfl:  .  lisinopril (PRINIVIL,ZESTRIL) 5 MG tablet, Take 1 tablet (5 mg total) by mouth daily., Disp: 90 tablet, Rfl: 0 .  loratadine (CLARITIN) 10 MG tablet, Take 10 mg by mouth at bedtime., Disp: , Rfl:  .   LORazepam (ATIVAN) 0.5 MG tablet, TAKE 1 TABLET BY MOUTH EVERY NIGHT AT BEDTIME, Disp: 30 tablet, Rfl: 2 .  meclizine (ANTIVERT) 25 MG tablet, Take 25 mg by mouth 3 (three) times daily as needed. Reported on 07/03/2015, Disp: , Rfl:  .  meloxicam (MOBIC) 15 MG tablet, Take 1 tablet (15 mg total) by mouth daily., Disp: 30 tablet, Rfl: 2 .  montelukast (SINGULAIR) 10 MG tablet, Take 1 tablet (10 mg total) by mouth at bedtime., Disp: 90 tablet, Rfl: 1 .  Multiple Vitamins-Minerals (MULTIPLE VITAMINS/WOMENS PO), Take 1 tablet by mouth daily., Disp: , Rfl:  .  mupirocin ointment (BACTROBAN) 2 %, Apply 1 application topically 2 (two) times daily., Disp: 22 g, Rfl: 0 .  rosuvastatin (CRESTOR) 5 MG tablet, TAKE 1 TABLET (5 MG TOTAL) BY MOUTH AT BEDTIME., Disp: 90 tablet, Rfl: 0 .  sertraline (ZOLOFT) 50 MG tablet, TAKE 1 TABLET BY MOUTH AT BEDTIME, Disp: 90 tablet, Rfl: 1 .  sucralfate (CARAFATE) 1 g tablet, TAKE 1 TABLET BY MOUTH 4 TIMES A DAY, Disp: 60 tablet, Rfl: 0 .  timolol (BETIMOL) 0.5 % ophthalmic solution, Place 1 drop into both eyes every morning.  , Disp: , Rfl:  .  tiZANidine (ZANAFLEX) 2 MG tablet, Take 1 tablet (2 mg total) by mouth every 8 (eight) hours as needed for muscle spasms., Disp: 30 tablet, Rfl: 0 .  varenicline (CHANTIX STARTING MONTH PAK) 0.5 MG X 11 & 1 MG X 42 tablet, Take one 0.5 mg tablet by mouth once daily for 3 days, then increase to one 0.5 mg tablet twice daily for 4 days, then increase to one 1 mg tablet twice daily., Disp: 53 tablet, Rfl: 0 .  Vitamin D, Ergocalciferol, (DRISDOL) 50000 units CAPS capsule, Take 1 capsule (50,000 Units total) by mouth once a week. For 12 weeks, Disp: 12 capsule, Rfl: 0  Allergies  Allergen Reactions  . Oxycodone Hcl Other (See Comments)    REACTION:"MAKES ME PALE"  . Propoxyphene Nausea And Vomiting  . Latex Rash    Irritation  (Condoms and gloves)    ROS   Ten systems reviewed and is negative except as mentioned in  HPI  Objective  Vitals:   12/09/16 1123  BP: 120/84  Pulse: 67  Resp: 16  Temp: 98.3 F (36.8 C)  TempSrc: Oral  SpO2: 96%  Weight: 162 lb 14.4 oz (73.9 kg)  Height: _0  (1.651 m)    Body mass index is 27.11 kg/m.  Nursing Note and Vital Signs reviewed.  Physical Exam  Constitutional: Patient appears well-developed and well-nourished.  No distress.  HEENT: head atraumatic, normocephalic Cardiovascular: Normal rate, regular rhythm, S1/S2 present.  No murmur or rub heard. No BLE edema. Pulmonary/Chest: Effort normal and breath sounds clear. No respiratory distress or retractions. Psychiatric: Patient has a  anxious mood and affect. behavior is appropriate for situation. Judgment and thought content are appropriate for situation, she is tearful throughout examination and discussion.  Recent Results (from the past 2160 hour(s))  Basic metabolic panel     Status: Abnormal   Collection Time: 09/27/16 12:59 PM  Result Value Ref Range   Sodium 136 135 - 145 mmol/L   Potassium 3.7 3.5 - 5.1 mmol/L   Chloride 106 101 - 111 mmol/L   CO2 23 22 - 32 mmol/L   Glucose, Bld 127 (H) 65 - 99 mg/dL   BUN 9 6 - 20 mg/dL   Creatinine, Ser 0.69 0.44 - 1.00 mg/dL   Calcium 9.3 8.9 - 10.3 mg/dL   GFR calc non Af Amer >60 >60 mL/min   GFR calc Af Amer >60 >60 mL/min    Comment: (NOTE) The eGFR has been calculated using the CKD EPI equation. This calculation has not been validated in all clinical situations. eGFR's persistently <60 mL/min signify possible Chronic Kidney Disease.    Anion gap 7 5 - 15  CBC     Status: None   Collection Time: 09/27/16 12:59 PM  Result Value Ref Range   WBC 7.5 3.6 - 11.0 K/uL   RBC 4.92 3.80 - 5.20 MIL/uL   Hemoglobin 14.5 12.0 - 16.0 g/dL   HCT 43.2 35.0 - 47.0 %   MCV 87.7 80.0 - 100.0 fL   MCH 29.5 26.0 - 34.0 pg   MCHC 33.6 32.0 - 36.0 g/dL   RDW 14.2 11.5 - 14.5 %   Platelets 203 150 - 440 K/uL  Troponin I     Status: None   Collection Time:  09/27/16 12:59 PM  Result Value Ref Range   Troponin I <0.03 <0.03 ng/mL     Assessment & Plan  1. Moderate episode of recurrent major depressive disorder ([place]) - Ambulatory referral to Psychology - had lengthy discussion regarding who she would like to see for a psychologist, and the referral information reflects her selection. I also showed her Psychology Today Website where she may choose a different psychologist to see if she would like. - sertraline (ZOLOFT) 100 MG tablet; Take 1 tablet (100 mg total) by mouth daily.  Dispense: 90 tablet; Refill: 0 - Advised patient to increase Zoloft to 19m x7 days (by taking 1.5 tablets of her 528mtabs), then start taking 10025maily.  And to stay out of work until seen by [facility] because work has been causing significant emotional stress and decreased ability to focus - She is agreeable to increase medication, but would like to see psychiatry in the next day or two to address her current situation. I provided detailed information regarding the [facility] that has a walk-in clinic that can provide care to her while she awaits psychiatry referral.  She will do this tomorrow. A work note is provided for her to return on 12/13/16 to allow [facility]. - Ambulatory referral to Psychiatry - Discussed her request to take extended time off, and advised that she needs to address this with her employer and go through the proper channels. She is aware that the work note I provided to her today cannot be extended past her return date of 12/13/2016 unless otherwise specified by a specialist ([facility]). - Advised that we cannot provide legal advise, but that we are here to support her medically for the management of her anxiety and depression and she is  understanding of this.  -Red flags suicidal or homicidal ideation, reviewed with patient at time of visit. Follow up and care instructions discussed and provided in AVS.  I have  reviewed this encounter including the documentation in this note and/or discussed this patient with the Johney Maine, FNP, NP-C. I am certifying that I agree with the content of this note as supervising physician.  Steele Sizer, MD [facility] 12/10/2016, 12:17 PM

## 2016-12-16 ENCOUNTER — Encounter: Payer: Self-pay | Admitting: Gastroenterology

## 2016-12-16 ENCOUNTER — Other Ambulatory Visit: Payer: Self-pay

## 2016-12-16 ENCOUNTER — Ambulatory Visit (INDEPENDENT_AMBULATORY_CARE_PROVIDER_SITE_OTHER): Payer: PPO | Admitting: Family Medicine

## 2016-12-16 ENCOUNTER — Encounter: Payer: Self-pay | Admitting: Family Medicine

## 2016-12-16 ENCOUNTER — Ambulatory Visit: Payer: PPO | Admitting: Family Medicine

## 2016-12-16 ENCOUNTER — Ambulatory Visit (INDEPENDENT_AMBULATORY_CARE_PROVIDER_SITE_OTHER): Payer: PPO | Admitting: Gastroenterology

## 2016-12-16 VITALS — BP 136/77 | HR 57 | Temp 98.4°F | Ht 65.0 in | Wt 165.2 lb

## 2016-12-16 VITALS — BP 124/70 | HR 75 | Temp 99.1°F | Resp 16 | Ht 65.0 in | Wt 163.8 lb

## 2016-12-16 DIAGNOSIS — K219 Gastro-esophageal reflux disease without esophagitis: Secondary | ICD-10-CM | POA: Diagnosis not present

## 2016-12-16 DIAGNOSIS — L732 Hidradenitis suppurativa: Secondary | ICD-10-CM | POA: Insufficient documentation

## 2016-12-16 DIAGNOSIS — F329 Major depressive disorder, single episode, unspecified: Secondary | ICD-10-CM

## 2016-12-16 DIAGNOSIS — R1013 Epigastric pain: Secondary | ICD-10-CM

## 2016-12-16 DIAGNOSIS — M222X9 Patellofemoral disorders, unspecified knee: Secondary | ICD-10-CM | POA: Insufficient documentation

## 2016-12-16 DIAGNOSIS — F410 Panic disorder [episodic paroxysmal anxiety] without agoraphobia: Secondary | ICD-10-CM

## 2016-12-16 DIAGNOSIS — Z791 Long term (current) use of non-steroidal anti-inflammatories (NSAID): Secondary | ICD-10-CM

## 2016-12-16 DIAGNOSIS — F419 Anxiety disorder, unspecified: Secondary | ICD-10-CM | POA: Diagnosis not present

## 2016-12-16 DIAGNOSIS — F809 Developmental disorder of speech and language, unspecified: Secondary | ICD-10-CM | POA: Insufficient documentation

## 2016-12-16 DIAGNOSIS — F32A Depression, unspecified: Secondary | ICD-10-CM

## 2016-12-16 DIAGNOSIS — E559 Vitamin D deficiency, unspecified: Secondary | ICD-10-CM | POA: Insufficient documentation

## 2016-12-16 DIAGNOSIS — R011 Cardiac murmur, unspecified: Secondary | ICD-10-CM | POA: Insufficient documentation

## 2016-12-16 DIAGNOSIS — Z789 Other specified health status: Secondary | ICD-10-CM | POA: Insufficient documentation

## 2016-12-16 DIAGNOSIS — R479 Unspecified speech disturbances: Secondary | ICD-10-CM

## 2016-12-16 MED ORDER — SERTRALINE HCL 50 MG PO TABS: 50.0000 mg | ORAL_TABLET | Freq: Every day | ORAL | 0 refills | Status: DC

## 2016-12-16 MED ORDER — OMEPRAZOLE 40 MG PO CPDR: 40.0000 mg | DELAYED_RELEASE_CAPSULE | Freq: Every day | ORAL | 3 refills | Status: DC

## 2016-12-16 MED ORDER — LORAZEPAM 0.5 MG PO TABS: 0.5000 mg | ORAL_TABLET | Freq: Every day | ORAL | 2 refills | Status: DC

## 2016-12-16 NOTE — Progress Notes (Signed)
Janice Bellows MD, MRCP(U.K) [address]  [place]  [place], [place] [address]  Main: [phone]  Fax: [phone]   Gastroenterology Consultation  Referring Provider:     Roselee Nova, MD Primary Care Physician:  Janice Nova, MD Primary Gastroenterologist:  Dr. Jonathon Wilson  Reason for Consultation:     GERD        HPI:   Janice Wilson is a 61 y.o. y/o female referred for consultation & management  by Dr. Manuella Wilson, Talbert Forest, MD.    She was recently seen in the ER in 09/2016 for chest discomfort which was attributed to gastritis, treated with sucralfate and discharged. She has been since referred by Dr Janice Wilson for endoscopy .   Presently she says she has a lot of cough , no abdominal , has chest discomfort, occurs after she eats, usually right after she eats, describes like mucus coming out associated with "discomfort , needle/burning like sensation. No issues climbing up stairs or walking - no chest pain. Intentionally trying to lose weight.   Presently takes carafate before she eats which helps. Not on any PPI. She has her dinner at 4-5 pm, goes to bed between 8-9 pm.In between she is at work standing up and sometimes lies down flat , she does say that her symptoms are worse at night and when she lies flat .   She takes Ibuprofen daily for neck pain - few tablets a day upto 6 a day.  Past Medical History:  Diagnosis Date  . Anxiety   . Asthma   . Dental crown present    dental implants - top, front  . Depression   . Glaucoma   . Headache(784.0)   . Heart murmur    followed by PCP  . Neck pain    s/p fusion.  limited side-to-side motion.  No limits up and down motion.  . Panic attack   . Shortness of breath   . Sleep apnea    uses CPAP machine sleep study 9/ 2012 done at sleep med in [place]  . Vertigo    no episodes in over 10 yrs  . Wears dentures    partial upper  . Wears hearing aid    left ear    Past Surgical History:  Procedure  Laterality Date  . ANTERIOR CERVICAL DECOMP/DISCECTOMY FUSION  07/05/2011   Procedure: ANTERIOR CERVICAL DECOMPRESSION/DISCECTOMY FUSION 3 LEVELS;  Surgeon: Cooper Render Pool;  Location: [facility];  Service: Neurosurgery;  Laterality: N/A;  Cervical Four-Five, Cervical Five-Six, Cervical Six-Seven Anterior Cervical Decompression Fusion WITH ALLOGRAFT AND PLATING  . CESAREAN SECTION    . COLONOSCOPY WITH PROPOFOL N/A 04/16/2016   Procedure: COLONOSCOPY WITH PROPOFOL;  Surgeon: Lucilla Lame, MD;  Location: [facility];  Service: Endoscopy;  Laterality: N/A;  sleep apnea LATEX sensitivity  . PARTIAL HYSTERECTOMY      Prior to Admission medications   Medication Sig Start Date End Date Taking? Authorizing Provider  acetaminophen (TYLENOL) 325 MG tablet Take 325 mg by mouth every 6 (six) hours as needed. For pain     [provider]  albuterol (PROVENTIL HFA;VENTOLIN HFA) 108 (90 BASE) MCG/ACT inhaler Inhale 1 puff into the lungs every 4 (four) hours as needed. For shortness of breath    [provider]  aspirin 81 MG tablet Take 81 mg by mouth daily.    [provider]  buPROPion (ZYBAN) 150 MG 12 hr tablet Take 1 tablet (  150 mg total) by mouth 2 (two) times daily. Start: 150 mg PO q day x 3 days, separate doses by at least 8hrs, last dose NLT 6 PM, stop smoking after 7 days of treatment. 11/10/16 01/05/17  Janice Nova, MD  conjugated estrogens (PREMARIN) vaginal cream Place vaginally. 08/14/15   [provider]  fluticasone (FLONASE) 50 MCG/ACT nasal spray Place 2 sprays into the nose daily as needed. FOR CONGESTION     [provider]  Fluticasone-Salmeterol (ADVAIR) 250-50 MCG/DOSE AEPB Inhale 1 puff into the lungs every 12 (twelve) hours as needed. FOR WHEEZING     [provider]  gabapentin (NEURONTIN) 300 MG capsule TAKE ONE CAPSULE BY MOUTH 3 TIMES A DAY 09/27/16   Janice Nova, MD  ibuprofen (ADVIL,MOTRIN) 800 MG tablet Take 800  mg by mouth daily as needed. FOR PAIN     [provider]  latanoprost (XALATAN) 0.005 % ophthalmic solution Place 1 drop into both eyes at bedtime.      [provider]  lisinopril (PRINIVIL,ZESTRIL) 5 MG tablet Take 1 tablet (5 mg total) by mouth daily. 11/10/16   Janice Nova, MD  loratadine (CLARITIN) 10 MG tablet Take 10 mg by mouth at bedtime.    [provider]  LORazepam (ATIVAN) 0.5 MG tablet Take 1 tablet (0.5 mg total) by mouth at bedtime. 12/16/16   Janice Nova, MD  meclizine (ANTIVERT) 25 MG tablet Take 25 mg by mouth 3 (three) times daily as needed. Reported on 07/03/2015    [provider]  meloxicam (MOBIC) 15 MG tablet Take 1 tablet (15 mg total) by mouth daily. 01/29/15   Hyatt, Max T, DPM  montelukast (SINGULAIR) 10 MG tablet Take 1 tablet (10 mg total) by mouth at bedtime. 03/17/16   Janice Nova, MD  Multiple Vitamins-Minerals (MULTIPLE VITAMINS/WOMENS PO) Take 1 tablet by mouth daily.    [provider]  mupirocin ointment (BACTROBAN) 2 % Apply 1 application topically 2 (two) times daily. 10/13/16   Janice Nova, MD  rosuvastatin (CRESTOR) 5 MG tablet TAKE 1 TABLET (5 MG TOTAL) BY MOUTH AT BEDTIME. 09/27/16   Janice Nova, MD  sertraline (ZOLOFT) 50 MG tablet Take 1 tablet (50 mg total) by mouth daily. 12/16/16   Rochel Brome A, MD  sucralfate (CARAFATE) 1 g tablet TAKE 1 TABLET BY MOUTH 4 TIMES A DAY 11/08/16   Rochel Brome A, MD  timolol (BETIMOL) 0.5 % ophthalmic solution Place 1 drop into both eyes every morning.      [provider]  tiZANidine (ZANAFLEX) 2 MG tablet Take 1 tablet (2 mg total) by mouth every 8 (eight) hours as needed for muscle spasms. 08/24/16   Janice Nova, MD  varenicline (CHANTIX STARTING MONTH PAK) 0.5 MG X 11 & 1 MG X 42 tablet Take one 0.5 mg tablet by mouth once daily for 3 days, then increase to one 0.5 mg tablet twice daily for 4 days, then increase to one 1 mg tablet  twice daily. 05/17/16   Arnetha Courser, MD  Vitamin D, Ergocalciferol, (DRISDOL) 50000 units CAPS capsule Take 1 capsule (50,000 Units total) by mouth once a week. For 12 weeks 08/11/16   Janice Nova, MD    Family History  Problem Relation Age of Onset  . Cancer Mother   . Stroke Father   . Anesthesia problems Neg Hx   .  Breast cancer Neg Hx      Social History  Substance Use Topics  . Smoking status: Current Some Day Smoker    Packs/day: 0.20    Years: 21.00    Types: Cigarettes    Last attempt to quit: 02/19/2013  . Smokeless tobacco: Never Used  . Alcohol use 0.0 oz/week     Comment: 1-2 beer a month(Holidays)    Allergies as of 12/16/2016 - Review Complete 12/16/2016  Allergen Reaction Noted  . Oxycodone hcl Other (See Comments) 06/18/2011  . Propoxyphene Nausea And Vomiting 01/02/2015  . Latex Rash 04/12/2016    Review of Systems:    All systems reviewed and negative except where noted in HPI.   Physical Exam:  There were no vitals taken for this visit. No LMP recorded. Patient has had a hysterectomy. Psych:  Alert and cooperative. Normal mood and affect. General:   Alert,  Well-developed, well-nourished, pleasant and cooperative in NAD Head:  Normocephalic and atraumatic. Eyes:  Sclera clear, no icterus.   Conjunctiva pink. Ears:  Normal auditory acuity. Nose:  No deformity, discharge, or lesions. Mouth:  No deformity or lesions,oropharynx pink & moist. Neck:  Supple; no masses or thyromegaly. Lungs:  Respirations even and unlabored.  Clear throughout to auscultation.   No wheezes, crackles, or rhonchi. No acute distress. Heart:  Regular rate and rhythm; no murmurs, clicks, rubs, or gallops. Abdomen:  Normal bowel sounds.  No bruits.  Soft, non-tender and non-distended without masses, hepatosplenomegaly or hernias noted.  No guarding or rebound tenderness.    Pulses:  Normal pulses noted. Extremities:  No clubbing or edema.  No cyanosis. Neurologic:  Alert  and oriented x3;  grossly normal neurologically. Lymph Nodes:  No significant cervical adenopathy. Psych:  Alert and cooperative. Normal mood and affect.  Imaging Studies: No results found.  Assessment and Plan:   Janice Wilson is a 61 y.o. y/o female has been referred for abdominal discomfort on going from 09/2016. She has a long history of NSAID use. Her symptoms are likely a combination of NSAID damage as well as reflux.   Plan  1. GERD life style changes-counseled-patient information provided.  2. Stop NSAID's 3.  Trial of Prilosec 4. EGD to r/o ulcers 5. H pylori stool antigen  I have discussed alternative options, risks & benefits,  which include, but are not limited to, bleeding, infection, perforation,respiratory complication & drug reaction.  The patient agrees with this plan & written consent will be obtained.     Follow up in 8 weeks  Dr Janice Bellows MD,MRCP(U.K)

## 2016-12-16 NOTE — Progress Notes (Signed)
Name: Janice Wilson   MRN: [id number]    DOB: May 22, 1956   Date:12/16/2016       Progress Note  Subjective  Chief Complaint  Chief Complaint  Patient presents with  . Follow-up    Anxiety    HPI  Patient presents for follow up of anxiety attack that she suffered at work because she believed that she was being discriminated against at work. She felt nauseous, anxious, crying episode, felt depressed. She was seen last week and Sertraline was increased to 100 mg daily which makes her feel 'terrible the next day'. She is still taking Lorazepam at night for anxiety, which seems to help.   Past Medical History:  Diagnosis Date  . Anxiety   . Asthma   . Dental crown present    dental implants - top, front  . Depression   . Glaucoma   . Headache(784.0)   . Heart murmur    followed by PCP  . Neck pain    s/p fusion.  limited side-to-side motion.  No limits up and down motion.  . Panic attack   . Shortness of breath   . Sleep apnea    uses CPAP machine sleep study 9/ 2012 done at sleep med in [place]  . Vertigo    no episodes in over 10 yrs  . Wears dentures    partial upper  . Wears hearing aid    left ear    Past Surgical History:  Procedure Laterality Date  . ANTERIOR CERVICAL DECOMP/DISCECTOMY FUSION  07/05/2011   Procedure: ANTERIOR CERVICAL DECOMPRESSION/DISCECTOMY FUSION 3 LEVELS;  Surgeon: Cooper Render Pool;  Location: [facility];  Service: Neurosurgery;  Laterality: N/A;  Cervical Four-Five, Cervical Five-Six, Cervical Six-Seven Anterior Cervical Decompression Fusion WITH ALLOGRAFT AND PLATING  . CESAREAN SECTION    . COLONOSCOPY WITH PROPOFOL N/A 04/16/2016   Procedure: COLONOSCOPY WITH PROPOFOL;  Surgeon: Lucilla Lame, MD;  Location: [place];  Service: Endoscopy;  Laterality: N/A;  sleep apnea LATEX sensitivity  . PARTIAL HYSTERECTOMY      Family History  Problem Relation Age of Onset  . Cancer Mother   . Stroke Father   . Anesthesia problems Neg  Hx   . Breast cancer Neg Hx     Social History   Social History  . Marital status: Single    Spouse name: N/A  . Number of children: N/A  . Years of education: N/A   Occupational History  . Not on file.   Social History Main Topics  . Smoking status: Current Some Day Smoker    Packs/day: 0.20    Years: 21.00    Types: Cigarettes    Last attempt to quit: 02/19/2013  . Smokeless tobacco: Never Used  . Alcohol use 0.0 oz/week     Comment: 1-2 beer a month(Holidays)  . Drug use: No  . Sexual activity: Not on file   Other Topics Concern  . Not on file   Social History Narrative  . No narrative on file     Current Outpatient Prescriptions:  .  acetaminophen (TYLENOL) 325 MG tablet, Take 325 mg by mouth every 6 (six) hours as needed. For pain , Disp: , Rfl:  .  albuterol (PROVENTIL HFA;VENTOLIN HFA) 108 (90 BASE) MCG/ACT inhaler, Inhale 1 puff into the lungs every 4 (four) hours as needed. For shortness of breath, Disp: , Rfl:  .  aspirin 81 MG tablet, Take 81 mg by mouth daily., Disp: , Rfl:  .  buPROPion (ZYBAN) 150 MG 12 hr tablet, Take 1 tablet (150 mg total) by mouth 2 (two) times daily. Start: 150 mg PO q day x 3 days, separate doses by at least 8hrs, last dose NLT 6 PM, stop smoking after 7 days of treatment., Disp: 112 tablet, Rfl: 0 .  conjugated estrogens (PREMARIN) vaginal cream, Place vaginally., Disp: , Rfl:  .  fluticasone (FLONASE) 50 MCG/ACT nasal spray, Place 2 sprays into the nose daily as needed. FOR CONGESTION , Disp: , Rfl:  .  Fluticasone-Salmeterol (ADVAIR) 250-50 MCG/DOSE AEPB, Inhale 1 puff into the lungs every 12 (twelve) hours as needed. FOR WHEEZING , Disp: , Rfl:  .  gabapentin (NEURONTIN) 300 MG capsule, TAKE ONE CAPSULE BY MOUTH 3 TIMES A DAY, Disp: 90 capsule, Rfl: 2 .  ibuprofen (ADVIL,MOTRIN) 800 MG tablet, Take 800 mg by mouth daily as needed. FOR PAIN , Disp: , Rfl:  .  latanoprost (XALATAN) 0.005 % ophthalmic solution, Place 1 drop into both  eyes at bedtime.  , Disp: , Rfl:  .  lisinopril (PRINIVIL,ZESTRIL) 5 MG tablet, Take 1 tablet (5 mg total) by mouth daily., Disp: 90 tablet, Rfl: 0 .  loratadine (CLARITIN) 10 MG tablet, Take 10 mg by mouth at bedtime., Disp: , Rfl:  .  LORazepam (ATIVAN) 0.5 MG tablet, TAKE 1 TABLET BY MOUTH EVERY NIGHT AT BEDTIME, Disp: 30 tablet, Rfl: 2 .  meclizine (ANTIVERT) 25 MG tablet, Take 25 mg by mouth 3 (three) times daily as needed. Reported on 07/03/2015, Disp: , Rfl:  .  meloxicam (MOBIC) 15 MG tablet, Take 1 tablet (15 mg total) by mouth daily., Disp: 30 tablet, Rfl: 2 .  montelukast (SINGULAIR) 10 MG tablet, Take 1 tablet (10 mg total) by mouth at bedtime., Disp: 90 tablet, Rfl: 1 .  Multiple Vitamins-Minerals (MULTIPLE VITAMINS/WOMENS PO), Take 1 tablet by mouth daily., Disp: , Rfl:  .  mupirocin ointment (BACTROBAN) 2 %, Apply 1 application topically 2 (two) times daily., Disp: 22 g, Rfl: 0 .  rosuvastatin (CRESTOR) 5 MG tablet, TAKE 1 TABLET (5 MG TOTAL) BY MOUTH AT BEDTIME., Disp: 90 tablet, Rfl: 0 .  sertraline (ZOLOFT) 100 MG tablet, Take 1 tablet (100 mg total) by mouth daily., Disp: 90 tablet, Rfl: 0 .  sertraline (ZOLOFT) 50 MG tablet, Take 50 mg by mouth at bedtime., Disp: , Rfl: 1 .  sucralfate (CARAFATE) 1 g tablet, TAKE 1 TABLET BY MOUTH 4 TIMES A DAY, Disp: 60 tablet, Rfl: 0 .  timolol (BETIMOL) 0.5 % ophthalmic solution, Place 1 drop into both eyes every morning.  , Disp: , Rfl:  .  tiZANidine (ZANAFLEX) 2 MG tablet, Take 1 tablet (2 mg total) by mouth every 8 (eight) hours as needed for muscle spasms., Disp: 30 tablet, Rfl: 0 .  varenicline (CHANTIX STARTING MONTH PAK) 0.5 MG X 11 & 1 MG X 42 tablet, Take one 0.5 mg tablet by mouth once daily for 3 days, then increase to one 0.5 mg tablet twice daily for 4 days, then increase to one 1 mg tablet twice daily., Disp: 53 tablet, Rfl: 0 .  Vitamin D, Ergocalciferol, (DRISDOL) 50000 units CAPS capsule, Take 1 capsule (50,000 Units total) by  mouth once a week. For 12 weeks, Disp: 12 capsule, Rfl: 0  Allergies  Allergen Reactions  . Oxycodone Hcl Other (See Comments)    REACTION:"MAKES ME PALE"  . Propoxyphene Nausea And Vomiting  . Latex Rash    Irritation  (Condoms and gloves)  ROS  Please see history of present illness for complete discussion of ROS  Objective  Vitals:   12/16/16 1105  BP: 124/70  Pulse: 75  Resp: 16  Temp: 99.1 F (37.3 C)  TempSrc: Oral  SpO2: 96%  Weight: 163 lb 12.8 oz (74.3 kg)  Height: 5\' 5"  (1.651 m)    Physical Exam  Constitutional: She is oriented to person, place, and time and well-developed, well-nourished, and in no distress.  HENT:  Head: Normocephalic and atraumatic.  Cardiovascular: Normal rate, regular rhythm and normal heart sounds.   No murmur heard. Pulmonary/Chest: Effort normal and breath sounds normal. She has no wheezes.  Neurological: She is alert and oriented to person, place, and time.  Psychiatric: Mood, memory, affect and judgment normal.  Nursing note and vitals reviewed.      Assessment & Plan  1. Panic disorder without agoraphobia Advised to decrease sertraline back to 50 mg because of   side effects, will refer patient to [facility] or [facility] for psychiatry evaluation- sertraline (ZOLOFT) 50 MG tablet; Take 1 tablet (50 mg total) by mouth daily.  Dispense: 90 tablet; Refill: 0  2. Anxiety and depression  stable and responsive to lorazepam, refills provided. - LORazepam (ATIVAN) 0.5 MG tablet; Take 1 tablet (0.5 mg total) by mouth at bedtime.  Dispense: 30 tablet; Refill: 2  Nikaela Coyne Asad A. [facility] 12/16/2016 11:29 AM

## 2016-12-16 NOTE — Addendum Note (Signed)
Addended by: Vanetta Mulders on: 12/16/2016 01:17 PM   Modules accepted: Orders, SmartSet

## 2016-12-19 ENCOUNTER — Other Ambulatory Visit: Payer: Self-pay | Admitting: Family Medicine

## 2016-12-19 DIAGNOSIS — G8929 Other chronic pain: Secondary | ICD-10-CM

## 2016-12-19 DIAGNOSIS — M542 Cervicalgia: Principal | ICD-10-CM

## 2016-12-21 ENCOUNTER — Telehealth: Payer: Self-pay | Admitting: Gastroenterology

## 2016-12-21 NOTE — Telephone Encounter (Signed)
Patient called and needs to reschedule her procedure. I did not call [facility]

## 2017-01-03 ENCOUNTER — Other Ambulatory Visit: Payer: Self-pay | Admitting: Family Medicine

## 2017-01-03 DIAGNOSIS — Z720 Tobacco use: Secondary | ICD-10-CM

## 2017-01-05 ENCOUNTER — Other Ambulatory Visit: Payer: Self-pay | Admitting: Family Medicine

## 2017-01-10 ENCOUNTER — Telehealth: Payer: Self-pay | Admitting: Family Medicine

## 2017-01-10 ENCOUNTER — Encounter: Payer: Self-pay | Admitting: *Deleted

## 2017-01-10 NOTE — Telephone Encounter (Signed)
PT IS NEEDING REFILL ON HER CHOLESTEROL MEDS. SHE SAIF THAT THE PHARM ( [facility] [address] ) TOLD HER THAT THEY PLACED AN ORDER ON THIS Wednesday AND SHE HAS NOT HEARD ANYTHING AND SHE SHE HAS BEEN OUT SINCE LAST WEEK.

## 2017-01-11 NOTE — Telephone Encounter (Signed)
Patient needs to schedule an appointment to recheck her fasting lipid panel, will adjust rosuvastatin as indicated based on the results of lipid panel.

## 2017-01-11 NOTE — Telephone Encounter (Signed)
PRINTED PT LABS AND NOTIFIED PT TO HAVE THEM DOEN AND MADE AN APPT ON Jan 19, 2017 FOR FU TO LABS FOR CHOLESTEROL MED REFILL PER DR.

## 2017-01-11 NOTE — Discharge Instructions (Signed)
General Anesthesia, Adult, Care After °These instructions provide you with information about caring for yourself after your procedure. Your health care provider may also give you more specific instructions. Your treatment has been planned according to current medical practices, but problems sometimes occur. Call your health care provider if you have any problems or questions after your procedure. °What can I expect after the procedure? °After the procedure, it is common to have: °· Vomiting. °· A sore throat. °· Mental slowness. ° °It is common to feel: °· Nauseous. °· Cold or shivery. °· Sleepy. °· Tired. °· Sore or achy, even in parts of your body where you did not have surgery. ° °Follow these instructions at home: °For at least 24 hours after the procedure: °· Do not: °? Participate in activities where you could fall or become injured. °? Drive. °? Use heavy machinery. °? Drink alcohol. °? Take sleeping pills or medicines that cause drowsiness. °? Make important decisions or sign legal documents. °? Take care of children on your own. °· Rest. °Eating and drinking °· If you vomit, drink water, juice, or soup when you can drink without vomiting. °· Drink enough fluid to keep your urine clear or pale yellow. °· Make sure you have little or no nausea before eating solid foods. °· Follow the diet recommended by your health care provider. °General instructions °· Have a responsible adult stay with you until you are awake and alert. °· Return to your normal activities as told by your health care provider. Ask your health care provider what activities are safe for you. °· Take over-the-counter and prescription medicines only as told by your health care provider. °· If you smoke, do not smoke without supervision. °· Keep all follow-up visits as told by your health care provider. This is important. °Contact a health care provider if: °· You continue to have nausea or vomiting at home, and medicines are not helpful. °· You  cannot drink fluids or start eating again. °· You cannot urinate after 8-12 hours. °· You develop a skin rash. °· You have fever. °· You have increasing redness at the site of your procedure. °Get help right away if: °· You have difficulty breathing. °· You have chest pain. °· You have unexpected bleeding. °· You feel that you are having a life-threatening or urgent problem. °This information is not intended to replace advice given to you by your health care provider. Make sure you discuss any questions you have with your health care provider. °Document Released: 09/13/2000 Document Revised: 11/10/2015 Document Reviewed: 05/22/2015 °Elsevier Interactive Patient Education © 2018 Elsevier Inc. ° °

## 2017-01-12 ENCOUNTER — Ambulatory Visit: Payer: PPO | Admitting: Anesthesiology

## 2017-01-12 ENCOUNTER — Encounter
Admission: RE | Disposition: A | Discharge status: Home / Self Care | Payer: Self-pay | Source: Ambulatory Visit | Attending: Gastroenterology

## 2017-01-12 ENCOUNTER — Ambulatory Visit
Admission: RE | Admit: 2017-01-12 | Discharge: 2017-01-12 | Disposition: A | Discharge status: Home / Self Care | Payer: PPO | Source: Ambulatory Visit | Attending: Gastroenterology | Admitting: Gastroenterology

## 2017-01-12 DIAGNOSIS — K2980 Duodenitis without bleeding: Secondary | ICD-10-CM

## 2017-01-12 DIAGNOSIS — K219 Gastro-esophageal reflux disease without esophagitis: Secondary | ICD-10-CM | POA: Diagnosis not present

## 2017-01-12 DIAGNOSIS — K2950 Unspecified chronic gastritis without bleeding: Secondary | ICD-10-CM | POA: Insufficient documentation

## 2017-01-12 DIAGNOSIS — J45909 Unspecified asthma, uncomplicated: Secondary | ICD-10-CM | POA: Diagnosis not present

## 2017-01-12 DIAGNOSIS — K3189 Other diseases of stomach and duodenum: Secondary | ICD-10-CM | POA: Diagnosis not present

## 2017-01-12 DIAGNOSIS — Z7951 Long term (current) use of inhaled steroids: Secondary | ICD-10-CM | POA: Insufficient documentation

## 2017-01-12 DIAGNOSIS — F17210 Nicotine dependence, cigarettes, uncomplicated: Secondary | ICD-10-CM | POA: Diagnosis not present

## 2017-01-12 DIAGNOSIS — Z79899 Other long term (current) drug therapy: Secondary | ICD-10-CM | POA: Insufficient documentation

## 2017-01-12 DIAGNOSIS — Z981 Arthrodesis status: Secondary | ICD-10-CM | POA: Diagnosis not present

## 2017-01-12 DIAGNOSIS — G8929 Other chronic pain: Secondary | ICD-10-CM

## 2017-01-12 DIAGNOSIS — Z7982 Long term (current) use of aspirin: Secondary | ICD-10-CM | POA: Diagnosis not present

## 2017-01-12 DIAGNOSIS — I10 Essential (primary) hypertension: Secondary | ICD-10-CM | POA: Insufficient documentation

## 2017-01-12 DIAGNOSIS — R1013 Epigastric pain: Secondary | ICD-10-CM | POA: Diagnosis not present

## 2017-01-12 DIAGNOSIS — F329 Major depressive disorder, single episode, unspecified: Secondary | ICD-10-CM | POA: Insufficient documentation

## 2017-01-12 DIAGNOSIS — K2930 Chronic superficial gastritis without bleeding: Secondary | ICD-10-CM | POA: Diagnosis not present

## 2017-01-12 DIAGNOSIS — G4730 Sleep apnea, unspecified: Secondary | ICD-10-CM | POA: Diagnosis not present

## 2017-01-12 DIAGNOSIS — R109 Unspecified abdominal pain: Secondary | ICD-10-CM | POA: Insufficient documentation

## 2017-01-12 DIAGNOSIS — B9681 Helicobacter pylori [H. pylori] as the cause of diseases classified elsewhere: Secondary | ICD-10-CM | POA: Insufficient documentation

## 2017-01-12 DIAGNOSIS — F410 Panic disorder [episodic paroxysmal anxiety] without agoraphobia: Secondary | ICD-10-CM | POA: Diagnosis not present

## 2017-01-12 DIAGNOSIS — Z9989 Dependence on other enabling machines and devices: Secondary | ICD-10-CM | POA: Diagnosis not present

## 2017-01-12 HISTORY — DX: Gastro-esophageal reflux disease without esophagitis: K21.9

## 2017-01-12 HISTORY — PX: ESOPHAGOGASTRODUODENOSCOPY (EGD) WITH PROPOFOL: SHX5813

## 2017-01-12 SURGERY — ESOPHAGOGASTRODUODENOSCOPY (EGD) WITH PROPOFOL
Anesthesia: General | Wound class: Clean Contaminated

## 2017-01-12 MED ORDER — GLYCOPYRROLATE 0.2 MG/ML IJ SOLN
INTRAMUSCULAR | Status: DC | PRN
  Administered 2017-01-12: 0.2 mg via INTRAVENOUS

## 2017-01-12 MED ORDER — ACETAMINOPHEN 160 MG/5ML PO SOLN: 325.0000 mg | ORAL | Status: DC | PRN

## 2017-01-12 MED ORDER — LIDOCAINE HCL (CARDIAC) 20 MG/ML IV SOLN
INTRAVENOUS | Status: DC | PRN
  Administered 2017-01-12: 40 mg via INTRAVENOUS

## 2017-01-12 MED ORDER — LACTATED RINGERS IV SOLN
INTRAVENOUS | Status: DC | PRN
  Administered 2017-01-12: 09:00:00 via INTRAVENOUS

## 2017-01-12 MED ORDER — PROPOFOL 10 MG/ML IV BOLUS
INTRAVENOUS | Status: DC | PRN
  Administered 2017-01-12: 20 mg via INTRAVENOUS
  Administered 2017-01-12: 120 mg via INTRAVENOUS
  Administered 2017-01-12 (×2): 30 mg via INTRAVENOUS

## 2017-01-12 MED ORDER — ACETAMINOPHEN 325 MG PO TABS: 325.0000 mg | ORAL_TABLET | ORAL | Status: DC | PRN

## 2017-01-12 MED ORDER — LACTATED RINGERS IV SOLN: INTRAVENOUS | Status: DC

## 2017-01-12 SURGICAL SUPPLY — 32 items
BALLN DILATOR 10-12 8 (BALLOONS)
BALLN DILATOR 12-15 8 (BALLOONS)
BALLN DILATOR 15-18 8 (BALLOONS)
BALLN DILATOR CRE 0-12 8 (BALLOONS)
BALLN DILATOR ESOPH 8 10 CRE (MISCELLANEOUS) IMPLANT
BALLOON DILATOR 12-15 8 (BALLOONS) IMPLANT
BALLOON DILATOR 15-18 8 (BALLOONS) IMPLANT
BALLOON DILATOR CRE 0-12 8 (BALLOONS) IMPLANT
BLOCK BITE 60FR ADLT L/F GRN (MISCELLANEOUS) ×2 IMPLANT
CANISTER SUCT 1200ML W/VALVE (MISCELLANEOUS) ×2 IMPLANT
CLIP HMST 235XBRD CATH ROT (MISCELLANEOUS) IMPLANT
CLIP RESOLUTION 360 11X235 (MISCELLANEOUS)
FCP ESCP3.2XJMB 240X2.8X (MISCELLANEOUS)
FORCEPS BIOP RAD 4 LRG CAP 4 (CUTTING FORCEPS) ×2 IMPLANT
FORCEPS BIOP RJ4 240 W/NDL (MISCELLANEOUS)
FORCEPS ESCP3.2XJMB 240X2.8X (MISCELLANEOUS) IMPLANT
GOWN CVR UNV OPN BCK APRN NK (MISCELLANEOUS) ×2 IMPLANT
GOWN ISOL THUMB LOOP REG UNIV (MISCELLANEOUS) ×2
INJECTOR VARIJECT VIN23 (MISCELLANEOUS) IMPLANT
KIT DEFENDO VALVE AND CONN (KITS) IMPLANT
KIT ENDO PROCEDURE OLY (KITS) ×2 IMPLANT
MARKER SPOT ENDO TATTOO 5ML (MISCELLANEOUS) IMPLANT
PAD GROUND ADULT SPLIT (MISCELLANEOUS) IMPLANT
RETRIEVER NET PLAT FOOD (MISCELLANEOUS) IMPLANT
SNARE SHORT THROW 13M SML OVAL (MISCELLANEOUS) IMPLANT
SNARE SHORT THROW 30M LRG OVAL (MISCELLANEOUS) IMPLANT
SPOT EX ENDOSCOPIC TATTOO (MISCELLANEOUS)
SYR INFLATION 60ML (SYRINGE) IMPLANT
TRAP ETRAP POLY (MISCELLANEOUS) IMPLANT
VARIJECT INJECTOR VIN23 (MISCELLANEOUS)
WATER STERILE IRR 250ML POUR (IV SOLUTION) ×2 IMPLANT
WIRE CRE 18-20MM 8CM F G (MISCELLANEOUS) IMPLANT

## 2017-01-12 NOTE — Op Note (Signed)
[facility] Patient Name: Janice Wilson Procedure Date: 01/12/2017 8:50 AM MRN: [id number] Account #: [id number] Date of Birth: 05/14/1956 Admit Type: Outpatient Age: 61 Room: [facility] OR ROOM 01 Gender: Female Note Status: Finalized Procedure:            Upper GI endoscopy Indications:          Epigastric abdominal pain Providers:            Jonathon Bellows MD, MD Referring MD:         Otila Back. Manuella Ghazi (Referring MD) Medicines:            Monitored Anesthesia Care Complications:        No immediate complications. Procedure:            Pre-Anesthesia Assessment:                       - Prior to the procedure, a History and Physical was                        performed, and patient medications, allergies and                        sensitivities were reviewed. The patient's tolerance of                        previous anesthesia was reviewed.                       - The risks and benefits of the procedure and the                        sedation options and risks were discussed with the                        patient. All questions were answered and informed                        consent was obtained.                       - ASA Grade Assessment: III - A patient with severe                        systemic disease.                       After obtaining informed consent, the endoscope was                        passed under direct vision. Throughout the procedure,                        the patient's blood pressure, pulse, and oxygen                        saturations were monitored continuously. The Olympus                        190 Endoscope [id number]) was introduced through the  mouth, and advanced to the third part of duodenum. The                        upper GI endoscopy was accomplished with ease. The                        patient tolerated the procedure poorly due to the                        patient's respiratory instability  (hypoxia). Findings:      The esophagus was normal.      Normal mucosa was found in the entire examined stomach. Biopsies were       taken with a cold forceps for histology.      Diffuse mildly erythematous mucosa without active bleeding and with no       stigmata of bleeding was found in the duodenal bulb. Biopsies were taken       with a cold forceps for histology. Impression:           - Normal esophagus.                       - Normal mucosa was found in the entire stomach.                        Biopsied.                       - Erythematous duodenopathy. Biopsied. Recommendation:       - Discharge patient to home (with escort).                       - Resume previous diet.                       - Continue present medications.                       - Await pathology results.                       - Return to my office as previously scheduled. Procedure Code(s):    --- Professional ---                       [id number], Esophagogastroduodenoscopy, flexible, transoral;                        with biopsy, single or multiple Diagnosis Code(s):    --- Professional ---                       K31.89, Other diseases of stomach and duodenum                       R10.13, Epigastric pain CPT copyright 2016 American Medical Association. All rights reserved. The codes documented in this report are preliminary and upon coder review may  be revised to meet current compliance requirements. Jonathon Bellows, MD Jonathon Bellows MD, MD 01/12/2017 9:25:55 AM This report has been signed electronically. Number of Addenda: 0 Note Initiated On: 01/12/2017 8:50 AM      [facility]

## 2017-01-12 NOTE — Anesthesia Postprocedure Evaluation (Signed)
Anesthesia Post Note  Patient: Janice Wilson  Procedure(s) Performed: Procedure(s) (LRB): ESOPHAGOGASTRODUODENOSCOPY (EGD) WITH PROPOFOL (N/A)  Patient location during evaluation: PACU Anesthesia Type: General Level of consciousness: awake and alert Pain management: pain level controlled Vital Signs Assessment: post-procedure vital signs reviewed and stable Respiratory status: spontaneous breathing, nonlabored ventilation, respiratory function stable and patient connected to nasal cannula oxygen Cardiovascular status: blood pressure returned to baseline and stable Postop Assessment: no signs of nausea or vomiting Anesthetic complications: no    Trecia Rogers

## 2017-01-12 NOTE — Anesthesia Preprocedure Evaluation (Signed)
Anesthesia Evaluation  Patient identified by MRN, date of birth, ID band Patient awake    Reviewed: Allergy & Precautions, H&P , NPO status , Patient's Chart, lab work & pertinent test results, reviewed documented beta blocker date and time   Airway Mallampati: III  TM Distance: >3 FB Neck ROM: full    Dental no notable dental hx.    Pulmonary shortness of breath, asthma , sleep apnea and Continuous Positive Airway Pressure Ventilation , Current Smoker,    Pulmonary exam normal breath sounds clear to auscultation       Cardiovascular Exercise Tolerance: Good hypertension, Normal cardiovascular exam Rhythm:regular Rate:Normal     Neuro/Psych  Headaches, PSYCHIATRIC DISORDERS Anxiety Depression    GI/Hepatic Neg liver ROS, GERD  ,  Endo/Other  negative endocrine ROS  Renal/GU negative Renal ROS  negative genitourinary   Musculoskeletal   Abdominal   Peds  Hematology negative hematology ROS (+)   Anesthesia Other Findings   Reproductive/Obstetrics negative OB ROS                             Anesthesia Physical Anesthesia Plan  ASA: II  Anesthesia Plan: General   Post-op Pain Management:    Induction:   PONV Risk Score and Plan:   Airway Management Planned:   Additional Equipment:   Intra-op Plan:   Post-operative Plan:   Informed Consent: I have reviewed the patients History and Physical, chart, labs and discussed the procedure including the risks, benefits and alternatives for the proposed anesthesia with the patient or authorized representative who has indicated his/her understanding and acceptance.   Dental Advisory Given  Plan Discussed with: CRNA and Anesthesiologist  Anesthesia Plan Comments:         Anesthesia Quick Evaluation

## 2017-01-12 NOTE — Anesthesia Procedure Notes (Signed)
Procedure Name: MAC Date/Time: 01/12/2017 9:14 AM Performed by: Janna Arch Pre-anesthesia Checklist: Patient identified, Emergency Drugs available, Suction available and Patient being monitored Patient Re-evaluated:Patient Re-evaluated prior to induction Oxygen Delivery Method: Nasal cannula

## 2017-01-12 NOTE — H&P (Signed)
Jonathon Bellows MD [address] [place], [place] [address] Phone: [phone] Fax : [phone]  Primary Care Physician:  Roselee Nova, MD Primary Gastroenterologist:  Dr. Jonathon Bellows   Pre-Procedure History & Physical: HPI:  Janice Wilson is a 61 y.o. female is here for an endoscopy.   Past Medical History:  Diagnosis Date  . Anxiety   . Asthma   . Dental crown present    dental implants - top, front  . Depression   . GERD (gastroesophageal reflux disease)   . Glaucoma   . Headache(784.0)   . Heart murmur    followed by PCP  . Neck pain    s/p fusion.  limited side-to-side motion.  No limits up and down motion.  . Panic attack   . Shortness of breath   . Sleep apnea    uses CPAP machine sleep study 9/ 2012 done at sleep med in [place]  . Vertigo    no episodes in over 10 yrs  . Wears dentures    partial upper  . Wears hearing aid    left ear    Past Surgical History:  Procedure Laterality Date  . ANTERIOR CERVICAL DECOMP/DISCECTOMY FUSION  07/05/2011   Procedure: ANTERIOR CERVICAL DECOMPRESSION/DISCECTOMY FUSION 3 LEVELS;  Surgeon: Cooper Render Pool;  Location: [place] [facility];  Service: Neurosurgery;  Laterality: N/A;  Cervical Four-Five, Cervical Five-Six, Cervical Six-Seven Anterior Cervical Decompression Fusion WITH ALLOGRAFT AND PLATING  . CESAREAN SECTION    . COLONOSCOPY WITH PROPOFOL N/A 04/16/2016   Procedure: COLONOSCOPY WITH PROPOFOL;  Surgeon: Lucilla Lame, MD;  Location: [place] [facility] [place];  Service: Endoscopy;  Laterality: N/A;  sleep apnea LATEX sensitivity  . PARTIAL HYSTERECTOMY      Prior to Admission medications   Medication Sig Start Date End Date Taking? Authorizing Provider  acetaminophen (TYLENOL) 325 MG tablet Take 325 mg by mouth every 6 (six) hours as needed. For pain    Yes [provider]  albuterol (PROVENTIL HFA;VENTOLIN HFA) 108 (90 BASE) MCG/ACT inhaler Inhale 1 puff into the lungs every 4 (four) hours as needed.  For shortness of breath   Yes [provider]  aspirin 81 MG tablet Take 81 mg by mouth daily.   Yes [provider]  conjugated estrogens (PREMARIN) vaginal cream Place vaginally. 08/14/15  Yes [provider]  fluticasone (FLONASE) 50 MCG/ACT nasal spray Place 2 sprays into the nose daily as needed. FOR CONGESTION    Yes [provider]  Fluticasone-Salmeterol (ADVAIR) 250-50 MCG/DOSE AEPB Inhale 1 puff into the lungs every 12 (twelve) hours as needed. FOR WHEEZING    Yes [provider]  gabapentin (NEURONTIN) 300 MG capsule TAKE ONE CAPSULE BY MOUTH 3 TIMES A DAY 12/20/16  Yes Keith Rake Asad A, MD  ibuprofen (ADVIL,MOTRIN) 800 MG tablet Take 800 mg by mouth daily as needed. FOR PAIN    Yes [provider]  latanoprost (XALATAN) 0.005 % ophthalmic solution Place 1 drop into both eyes at bedtime.     Yes [provider]  lisinopril (PRINIVIL,ZESTRIL) 5 MG tablet Take 1 tablet (5 mg total) by mouth daily. 11/10/16  Yes Roselee Nova, MD  loratadine (CLARITIN) 10 MG tablet Take 10 mg by mouth at bedtime.   Yes [provider]  LORazepam (ATIVAN) 0.5 MG tablet Take 1 tablet (0.5 mg total) by mouth at bedtime. 12/16/16  Yes Roselee Nova, MD  meclizine (ANTIVERT) 25 MG tablet Take 25  mg by mouth 3 (three) times daily as needed. Reported on 07/03/2015   Yes [provider]  montelukast (SINGULAIR) 10 MG tablet Take 1 tablet (10 mg total) by mouth at bedtime. 03/17/16  Yes Roselee Nova, MD  Multiple Vitamins-Minerals (MULTIPLE VITAMINS/WOMENS PO) Take 1 tablet by mouth daily.   Yes [provider]  omeprazole (PRILOSEC) 40 MG capsule Take 1 capsule (40 mg total) by mouth daily. 12/16/16 02/15/17 Yes Jonathon Bellows, MD  rosuvastatin (CRESTOR) 5 MG tablet TAKE 1 TABLET (5 MG TOTAL) BY MOUTH AT BEDTIME. 09/27/16  Yes Roselee Nova, MD  sertraline (ZOLOFT) 50 MG tablet Take 1 tablet (50 mg total) by mouth daily.  12/16/16  Yes Keith Rake Asad A, MD  sucralfate (CARAFATE) 1 g tablet TAKE 1 TABLET BY MOUTH 4 TIMES A DAY 11/08/16  Yes Keith Rake Asad A, MD  timolol (BETIMOL) 0.5 % ophthalmic solution Place 1 drop into both eyes every morning.     Yes [provider]  tiZANidine (ZANAFLEX) 2 MG tablet Take 1 tablet (2 mg total) by mouth every 8 (eight) hours as needed for muscle spasms. 08/24/16  Yes Roselee Nova, MD  meloxicam (MOBIC) 15 MG tablet Take 1 tablet (15 mg total) by mouth daily. Patient not taking: Reported on 01/10/2017 01/29/15   Tyson Dense T, DPM  mupirocin ointment (BACTROBAN) 2 % Apply 1 application topically 2 (two) times daily. 10/13/16   Roselee Nova, MD  sertraline (ZOLOFT) 100 MG tablet Take 100 mg by mouth daily. 12/09/16   [provider]  varenicline (CHANTIX STARTING MONTH PAK) 0.5 MG X 11 & 1 MG X 42 tablet Take one 0.5 mg tablet by mouth once daily for 3 days, then increase to one 0.5 mg tablet twice daily for 4 days, then increase to one 1 mg tablet twice daily. Patient not taking: Reported on 01/10/2017 05/17/16   Arnetha Courser, MD    Allergies as of 12/16/2016 - Review Complete 12/16/2016  Allergen Reaction Noted  . Oxycodone hcl Other (See Comments) 06/18/2011  . Propoxyphene Nausea And Vomiting 01/02/2015  . Latex Rash 04/12/2016    Family History  Problem Relation Age of Onset  . Cancer Mother   . Stroke Father   . Anesthesia problems Neg Hx   . Breast cancer Neg Hx     Social History   Social History  . Marital status: Single    Spouse name: N/A  . Number of children: N/A  . Years of education: N/A   Occupational History  . Not on file.   Social History Main Topics  . Smoking status: Current Some Day Smoker    Packs/day: 0.20    Years: 21.00    Types: Cigarettes    Last attempt to quit: 02/19/2013  . Smokeless tobacco: Never Used  . Alcohol use 0.0 oz/week     Comment: 1-2 beer a month(Holidays)  . Drug use: No  . Sexual  activity: Yes   Other Topics Concern  . Not on file   Social History Narrative  . No narrative on file    Review of Systems: See HPI, otherwise negative ROS  Physical Exam: BP (!) 155/87   Pulse (!) 56   Temp 97.7 F (36.5 C) (Temporal)   Ht 5\' 5"  (1.651 m)   Wt 164 lb (74.4 kg)   SpO2 97%   BMI 27.29 kg/m  General:   Alert,  pleasant and cooperative in  NAD Head:  Normocephalic and atraumatic. Neck:  Supple; no masses or thyromegaly. Lungs:  Clear throughout to auscultation.    Heart:  Regular rate and rhythm. Abdomen:  Soft, nontender and nondistended. Normal bowel sounds, without guarding, and without rebound.   Neurologic:  Alert and  oriented x4;  grossly normal neurologically.  Impression/Plan: RONNE SAVOIA is here for an endoscopy to be performed for abdominal pain   Risks, benefits, limitations, and alternatives regarding  endoscopy have been reviewed with the patient.  Questions have been answered.  All parties agreeable.   Jonathon Bellows, MD  01/12/2017, 9:10 AM

## 2017-01-12 NOTE — Transfer of Care (Signed)
Immediate Anesthesia Transfer of Care Note  Patient: Janice Wilson  Procedure(s) Performed: Procedure(s) with comments: ESOPHAGOGASTRODUODENOSCOPY (EGD) WITH PROPOFOL (N/A) - Latex sensitivity sleep apnea  Patient Location: PACU  Anesthesia Type: General  Level of Consciousness: awake, alert  and patient cooperative  Airway and Oxygen Therapy: Patient Spontanous Breathing and Patient connected to supplemental oxygen  Post-op Assessment: Post-op Vital signs reviewed, Patient's Cardiovascular Status Stable, Respiratory Function Stable, Patent Airway and No signs of Nausea or vomiting  Post-op Vital Signs: Reviewed and stable  Complications: No apparent anesthesia complications

## 2017-01-13 ENCOUNTER — Encounter: Payer: Self-pay | Admitting: Gastroenterology

## 2017-01-14 ENCOUNTER — Telehealth: Payer: Self-pay | Admitting: Gastroenterology

## 2017-01-14 ENCOUNTER — Encounter: Payer: Self-pay | Admitting: Gastroenterology

## 2017-01-14 DIAGNOSIS — L821 Other seborrheic keratosis: Secondary | ICD-10-CM | POA: Diagnosis not present

## 2017-01-14 DIAGNOSIS — L728 Other follicular cysts of the skin and subcutaneous tissue: Secondary | ICD-10-CM | POA: Diagnosis not present

## 2017-01-14 DIAGNOSIS — K644 Residual hemorrhoidal skin tags: Secondary | ICD-10-CM | POA: Diagnosis not present

## 2017-01-14 NOTE — Telephone Encounter (Signed)
Patient has some questions regarding her discharge paperwork. Please call

## 2017-01-17 ENCOUNTER — Other Ambulatory Visit: Payer: Self-pay

## 2017-01-17 MED ORDER — AMOXICILLIN 500 MG PO CAPS
1000.0000 mg | ORAL_CAPSULE | Freq: Two times a day (BID) | ORAL | 0 refills | Status: DC
Start: 2017-01-17 — End: 2017-03-25

## 2017-01-17 MED ORDER — CLARITHROMYCIN 500 MG PO TABS: 500.0000 mg | ORAL_TABLET | Freq: Two times a day (BID) | ORAL | 0 refills | Status: DC

## 2017-01-17 NOTE — Telephone Encounter (Signed)
Patient contacted office to see if there was a biopsy taken during her EGD.  I informed her a biopsy was taken and the nurse will call her back once we receive the pathology results.

## 2017-01-18 ENCOUNTER — Encounter: Payer: Self-pay | Admitting: Gastroenterology

## 2017-01-19 ENCOUNTER — Ambulatory Visit: Payer: PPO | Admitting: Family Medicine

## 2017-02-07 ENCOUNTER — Telehealth: Payer: Self-pay | Admitting: Gastroenterology

## 2017-02-07 NOTE — Telephone Encounter (Signed)
Patient left a voice message that she is finished with the antibiotic that she was given after surgery and needs to know what to do next. Please call

## 2017-02-08 ENCOUNTER — Other Ambulatory Visit: Payer: Self-pay

## 2017-02-08 DIAGNOSIS — A048 Other specified bacterial intestinal infections: Secondary | ICD-10-CM

## 2017-02-08 NOTE — Telephone Encounter (Signed)
LVM answering patient's question.  Order H.Pylori lab for re-check of infection status

## 2017-02-10 ENCOUNTER — Ambulatory Visit: Payer: PPO | Admitting: Family Medicine

## 2017-02-14 ENCOUNTER — Other Ambulatory Visit
Admission: RE | Admit: 2017-02-14 | Discharge: 2017-02-14 | Disposition: A | Discharge status: Home / Self Care | Payer: PPO | Source: Ambulatory Visit | Attending: Gastroenterology | Admitting: Gastroenterology

## 2017-02-14 ENCOUNTER — Other Ambulatory Visit: Payer: Self-pay | Admitting: Family Medicine

## 2017-02-14 DIAGNOSIS — A048 Other specified bacterial intestinal infections: Secondary | ICD-10-CM

## 2017-02-14 DIAGNOSIS — E785 Hyperlipidemia, unspecified: Secondary | ICD-10-CM | POA: Diagnosis not present

## 2017-02-15 ENCOUNTER — Telehealth: Payer: Self-pay | Admitting: Gastroenterology

## 2017-02-15 LAB — LIPID PANEL
Cholesterol: 238 mg/dL — ABNORMAL HIGH (ref <200)
HDL: 48 mg/dL — ABNORMAL LOW (ref >50)
LDL Cholesterol: 173 mg/dL — ABNORMAL HIGH (ref <100)
Total CHOL/HDL Ratio: 5 Ratio — ABNORMAL HIGH (ref <5.0)
Triglycerides: 87 mg/dL (ref <150)
VLDL: 17 mg/dL (ref <30)

## 2017-02-15 NOTE — Telephone Encounter (Signed)
Patient called wanting her H pylori results.

## 2017-02-16 ENCOUNTER — Telehealth: Payer: Self-pay

## 2017-02-16 MED ORDER — ROSUVASTATIN CALCIUM 20 MG PO TABS
20.0000 mg | ORAL_TABLET | Freq: Every day | ORAL | 0 refills | Status: DC
Start: 2017-02-16 — End: 2017-05-16

## 2017-02-16 NOTE — Telephone Encounter (Signed)
LVM advising patient that the information she requested is not available yet.

## 2017-02-16 NOTE — Telephone Encounter (Signed)
Patient has been notified of lab results and a prescription for rosuvastatin 20 mg at bedtime has been sent to [facility] per Dr. Manuella Ghazi, patient has been notified of increase in dosage of medication, patient verbalized understanding

## 2017-02-17 ENCOUNTER — Other Ambulatory Visit: Payer: Self-pay | Admitting: Family Medicine

## 2017-02-17 ENCOUNTER — Telehealth: Payer: Self-pay

## 2017-02-17 DIAGNOSIS — J3089 Other allergic rhinitis: Secondary | ICD-10-CM

## 2017-02-17 LAB — H. PYLORI ANTIGEN, STOOL: H. Pylori Stool Ag, Eia: NEGATIVE

## 2017-02-17 NOTE — Telephone Encounter (Signed)
See below

## 2017-02-17 NOTE — Telephone Encounter (Signed)
LVM for patient callback for results.

## 2017-02-17 NOTE — Telephone Encounter (Signed)
-----   Message from Lucilla Lame, MD sent at 02/17/2017  9:50 AM EDT ----- Let the patient know the H. Pylori was negative.

## 2017-03-08 ENCOUNTER — Other Ambulatory Visit: Payer: Self-pay | Admitting: Family Medicine

## 2017-03-08 DIAGNOSIS — F331 Major depressive disorder, recurrent, moderate: Secondary | ICD-10-CM

## 2017-03-13 ENCOUNTER — Other Ambulatory Visit: Payer: Self-pay | Admitting: Family Medicine

## 2017-03-13 DIAGNOSIS — F331 Major depressive disorder, recurrent, moderate: Secondary | ICD-10-CM

## 2017-03-21 ENCOUNTER — Other Ambulatory Visit: Payer: Self-pay | Admitting: Family Medicine

## 2017-03-21 DIAGNOSIS — F32A Depression, unspecified: Secondary | ICD-10-CM

## 2017-03-21 DIAGNOSIS — F419 Anxiety disorder, unspecified: Secondary | ICD-10-CM

## 2017-03-21 DIAGNOSIS — F329 Major depressive disorder, single episode, unspecified: Secondary | ICD-10-CM

## 2017-03-22 ENCOUNTER — Other Ambulatory Visit: Payer: Self-pay | Admitting: Family Medicine

## 2017-03-22 DIAGNOSIS — F331 Major depressive disorder, recurrent, moderate: Secondary | ICD-10-CM

## 2017-03-24 ENCOUNTER — Telehealth: Payer: Self-pay | Admitting: Family Medicine

## 2017-03-24 NOTE — Telephone Encounter (Signed)
Please have pt scheduled for an appointment for medication refill and follow up, thanks!

## 2017-03-24 NOTE — Telephone Encounter (Signed)
Tried calling pt, went to VM and Mailbox is full.

## 2017-03-24 NOTE — Telephone Encounter (Signed)
Pt needs a refill on Lorazapam and also has some paperwork here for Dr Manuella Ghazi to fill out for her disability. Please advise pt.

## 2017-03-25 ENCOUNTER — Encounter: Payer: Self-pay | Admitting: Family Medicine

## 2017-03-25 ENCOUNTER — Ambulatory Visit (INDEPENDENT_AMBULATORY_CARE_PROVIDER_SITE_OTHER): Payer: PPO | Admitting: Family Medicine

## 2017-03-25 VITALS — BP 132/82 | HR 81 | Ht 65.0 in | Wt 161.5 lb

## 2017-03-25 DIAGNOSIS — K219 Gastro-esophageal reflux disease without esophagitis: Secondary | ICD-10-CM | POA: Diagnosis not present

## 2017-03-25 DIAGNOSIS — F419 Anxiety disorder, unspecified: Secondary | ICD-10-CM | POA: Diagnosis not present

## 2017-03-25 DIAGNOSIS — F329 Major depressive disorder, single episode, unspecified: Secondary | ICD-10-CM | POA: Diagnosis not present

## 2017-03-25 DIAGNOSIS — M542 Cervicalgia: Secondary | ICD-10-CM | POA: Diagnosis not present

## 2017-03-25 DIAGNOSIS — Z23 Encounter for immunization: Secondary | ICD-10-CM

## 2017-03-25 DIAGNOSIS — I10 Essential (primary) hypertension: Secondary | ICD-10-CM

## 2017-03-25 DIAGNOSIS — F32A Depression, unspecified: Secondary | ICD-10-CM

## 2017-03-25 DIAGNOSIS — G8929 Other chronic pain: Secondary | ICD-10-CM | POA: Diagnosis not present

## 2017-03-25 MED ORDER — LISINOPRIL 5 MG PO TABS: 5.0000 mg | ORAL_TABLET | Freq: Every day | ORAL | 0 refills | Status: DC

## 2017-03-25 MED ORDER — SERTRALINE HCL 100 MG PO TABS: 100.0000 mg | ORAL_TABLET | Freq: Every day | ORAL | 0 refills | Status: DC

## 2017-03-25 MED ORDER — LORAZEPAM 0.5 MG PO TABS: 0.5000 mg | ORAL_TABLET | Freq: Every day | ORAL | 2 refills | Status: DC

## 2017-03-25 MED ORDER — OMEPRAZOLE 40 MG PO CPDR: 40.0000 mg | DELAYED_RELEASE_CAPSULE | Freq: Every day | ORAL | 0 refills | Status: DC
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
## 2017-04-02 ENCOUNTER — Other Ambulatory Visit: Payer: Self-pay | Admitting: Family Medicine

## 2017-04-02 ENCOUNTER — Other Ambulatory Visit: Payer: Self-pay | Admitting: Gastroenterology

## 2017-04-02 DIAGNOSIS — B9689 Other specified bacterial agents as the cause of diseases classified elsewhere: Secondary | ICD-10-CM

## 2017-04-02 DIAGNOSIS — L089 Local infection of the skin and subcutaneous tissue, unspecified: Secondary | ICD-10-CM

## 2017-04-04 ENCOUNTER — Other Ambulatory Visit: Payer: Self-pay | Admitting: Family Medicine

## 2017-04-04 DIAGNOSIS — I10 Essential (primary) hypertension: Secondary | ICD-10-CM

## 2017-04-05 DIAGNOSIS — N949 Unspecified condition associated with female genital organs and menstrual cycle: Secondary | ICD-10-CM | POA: Diagnosis not present

## 2017-04-05 DIAGNOSIS — F411 Generalized anxiety disorder: Secondary | ICD-10-CM | POA: Diagnosis not present

## 2017-04-05 DIAGNOSIS — F5101 Primary insomnia: Secondary | ICD-10-CM | POA: Diagnosis not present

## 2017-04-09 ENCOUNTER — Other Ambulatory Visit: Payer: Self-pay | Admitting: Family Medicine

## 2017-04-09 DIAGNOSIS — I10 Essential (primary) hypertension: Secondary | ICD-10-CM

## 2017-04-14 ENCOUNTER — Telehealth: Payer: Self-pay

## 2017-04-14 NOTE — Telephone Encounter (Signed)
RX request for Mupirocin ointment 2% and bupropion 150 mg tab

## 2017-04-14 NOTE — Telephone Encounter (Signed)
Appropriate on his not on her active list of medications, as far as mupirocin ointment is concerned, she has history of bacterial skin infection and is okay to refill with the following sig: Mupirocin ointment 2%, apply to affected area up to 3 times daily for 7 days Dispense 1 tube, no refills

## 2017-04-15 ENCOUNTER — Other Ambulatory Visit: Payer: Self-pay

## 2017-04-15 DIAGNOSIS — R208 Other disturbances of skin sensation: Secondary | ICD-10-CM | POA: Diagnosis not present

## 2017-04-15 DIAGNOSIS — L089 Local infection of the skin and subcutaneous tissue, unspecified: Secondary | ICD-10-CM

## 2017-04-15 DIAGNOSIS — B9689 Other specified bacterial agents as the cause of diseases classified elsewhere: Secondary | ICD-10-CM

## 2017-04-15 DIAGNOSIS — L720 Epidermal cyst: Secondary | ICD-10-CM | POA: Diagnosis not present

## 2017-04-15 DIAGNOSIS — L728 Other follicular cysts of the skin and subcutaneous tissue: Secondary | ICD-10-CM | POA: Diagnosis not present

## 2017-04-15 MED ORDER — MUPIROCIN 2 % EX OINT: TOPICAL_OINTMENT | Freq: Three times a day (TID) | CUTANEOUS | Status: DC

## 2017-04-15 NOTE — Telephone Encounter (Signed)
Ointment was approved and sent to her local pharmacy

## 2017-04-18 ENCOUNTER — Other Ambulatory Visit: Payer: Self-pay | Admitting: Family Medicine

## 2017-04-18 DIAGNOSIS — I10 Essential (primary) hypertension: Secondary | ICD-10-CM

## 2017-04-25 ENCOUNTER — Encounter: Payer: Self-pay | Admitting: Family Medicine

## 2017-04-28 ENCOUNTER — Other Ambulatory Visit: Payer: Self-pay | Admitting: Family Medicine

## 2017-04-28 NOTE — Telephone Encounter (Signed)
Bupropion appears to have been prescribed in May and stopped in July; this medicine was not being taken when she saw him on 03/25/17 Bupropion is not on current med list so I won't refill I'll leave for Dr. Manuella Ghazi to decide about this (Is she to start over? Did it work? Does she need an appt? Did she have a reaction and it was taking off the med list in July?)

## 2017-05-02 NOTE — Telephone Encounter (Signed)
Appropriate on not on active med list, this was prescribed for smoking cessation. If patient wishes to stop smoking, she will need to schedule an appointment to discuss pharmacotherapeutic options

## 2017-05-04 ENCOUNTER — Ambulatory Visit: Payer: Self-pay | Admitting: Family Medicine

## 2017-05-09 ENCOUNTER — Ambulatory Visit: Payer: Self-pay | Admitting: Family Medicine

## 2017-05-16 ENCOUNTER — Other Ambulatory Visit: Payer: Self-pay | Admitting: Family Medicine

## 2017-05-16 ENCOUNTER — Ambulatory Visit: Payer: Self-pay | Admitting: Family Medicine

## 2017-05-16 DIAGNOSIS — H903 Sensorineural hearing loss, bilateral: Secondary | ICD-10-CM | POA: Diagnosis not present

## 2017-05-16 MED ORDER — ROSUVASTATIN CALCIUM 20 MG PO TABS: 20.0000 mg | ORAL_TABLET | Freq: Every day | ORAL | 0 refills | Status: DC

## 2017-05-16 NOTE — Telephone Encounter (Unsigned)
Copied from [place] [id number]. Topic: Quick Communication - See Telephone Encounter >> May 16, 2017 12:11 PM Aurelio Brash B wrote: CRM for notification. See Telephone encounter for:  Refill rosuvastatin (CRESTOR) 20 MG tablet  05/16/17.

## 2017-05-16 NOTE — Telephone Encounter (Signed)
Copied from [place] [phone]. Topic: Quick Communication - See Telephone Encounter >> May 16, 2017 12:11 PM Aurelio Brash B wrote: CRM for notification. See Telephone encounter for:  Refill rosuvastatin (CRESTOR) 20 MG tablet [facility]  05/16/17.

## 2017-05-17 ENCOUNTER — Other Ambulatory Visit: Payer: Self-pay | Admitting: Family Medicine

## 2017-05-17 DIAGNOSIS — J3089 Other allergic rhinitis: Secondary | ICD-10-CM

## 2017-05-24 ENCOUNTER — Ambulatory Visit: Payer: Self-pay | Admitting: Family Medicine

## 2017-06-22 ENCOUNTER — Ambulatory Visit (INDEPENDENT_AMBULATORY_CARE_PROVIDER_SITE_OTHER): Payer: PPO | Admitting: Podiatry

## 2017-06-22 ENCOUNTER — Encounter: Payer: Self-pay | Admitting: Podiatry

## 2017-06-22 DIAGNOSIS — L600 Ingrowing nail: Secondary | ICD-10-CM

## 2017-06-22 MED ORDER — NEOMYCIN-POLYMYXIN-HC 1 % OT SOLN: OTIC | 1 refills | Status: DC

## 2017-06-22 NOTE — Progress Notes (Signed)
She presents today with a chief complaint of painful ingrown toenail fibular border of the hallux left.  Objective: Pulses are strongly palpable left foot.  Thick yellow dystrophic onychomycotic nail plate incurvated along the fibular border.  Erythema and edema no purulence no malodor.  Painful on palpation.  Assessment: Pain in limb secondary to onychomycosis and ingrown nail hallux left.  Plan: Chemical matrixectomy was performed today after local anesthesia was administered.  She tolerated procedure well without complications.  We provided her with both oral and written home-going instructions for the care and soaking of her toe as well as a prescription for Cortisporin Otic to be applied twice daily after soaking.  Follow-up with her in 2 weeks

## 2017-06-22 NOTE — Patient Instructions (Signed)

## 2017-06-27 ENCOUNTER — Ambulatory Visit: Payer: PPO | Admitting: Family Medicine

## 2017-07-05 DIAGNOSIS — K219 Gastro-esophageal reflux disease without esophagitis: Secondary | ICD-10-CM | POA: Diagnosis not present

## 2017-07-05 DIAGNOSIS — R070 Pain in throat: Secondary | ICD-10-CM | POA: Diagnosis not present

## 2017-07-06 ENCOUNTER — Encounter: Payer: Self-pay | Admitting: Podiatry

## 2017-07-06 ENCOUNTER — Ambulatory Visit (INDEPENDENT_AMBULATORY_CARE_PROVIDER_SITE_OTHER): Payer: PPO | Admitting: Podiatry

## 2017-07-06 DIAGNOSIS — L600 Ingrowing nail: Secondary | ICD-10-CM

## 2017-07-06 MED ORDER — DOXYCYCLINE HYCLATE 100 MG PO TABS: 100.0000 mg | ORAL_TABLET | Freq: Two times a day (BID) | ORAL | 0 refills | Status: DC

## 2017-07-06 NOTE — Progress Notes (Signed)
She presents today for follow-up of her paronychia hallux left.  She states the toe seems to be worse than it was before she states is very sore and is draining.  She states is red and swollen.  She states that she is taking prednisone and has been for a while because of congestion and bronchitis.  She is not taking any antibiotics.  Objective: Vital signs are stable alert and oriented x3.  Pulses are palpable.  Neurologic sensorium is intact.  She reflexes are intact.  No erythema cellulitis drainage or odor a very nice clean margin that has healed minimally since I saw it last.  No signs of infection.  Assessment: Cannot rule out a mild paronychia but delayed healing more than likely associated with the strong steroids.  Plan: Start soaking Epsom salts and will more discontinue Betadine and warm water covered during the day but leave open at bedtime.  I encouraged her also to start doxycycline 100 mg twice daily for the next 2 weeks.  Follow-up with her in 2 weeks.

## 2017-07-07 ENCOUNTER — Ambulatory Visit: Payer: PPO | Admitting: Family Medicine

## 2017-07-07 ENCOUNTER — Other Ambulatory Visit: Payer: Self-pay | Admitting: Family Medicine

## 2017-07-07 DIAGNOSIS — I10 Essential (primary) hypertension: Secondary | ICD-10-CM

## 2017-07-14 ENCOUNTER — Ambulatory Visit: Payer: PPO | Admitting: Family Medicine

## 2017-07-19 ENCOUNTER — Ambulatory Visit (INDEPENDENT_AMBULATORY_CARE_PROVIDER_SITE_OTHER): Payer: PPO | Admitting: Family Medicine

## 2017-07-19 ENCOUNTER — Encounter: Payer: Self-pay | Admitting: Family Medicine

## 2017-07-19 ENCOUNTER — Other Ambulatory Visit: Payer: Self-pay

## 2017-07-19 ENCOUNTER — Telehealth: Payer: Self-pay

## 2017-07-19 VITALS — BP 118/72 | HR 69 | Temp 98.2°F | Resp 16 | Wt 163.0 lb

## 2017-07-19 DIAGNOSIS — J4520 Mild intermittent asthma, uncomplicated: Secondary | ICD-10-CM | POA: Diagnosis not present

## 2017-07-19 DIAGNOSIS — K2970 Gastritis, unspecified, without bleeding: Secondary | ICD-10-CM | POA: Diagnosis not present

## 2017-07-19 DIAGNOSIS — E782 Mixed hyperlipidemia: Secondary | ICD-10-CM

## 2017-07-19 DIAGNOSIS — Z720 Tobacco use: Secondary | ICD-10-CM | POA: Diagnosis not present

## 2017-07-19 DIAGNOSIS — F419 Anxiety disorder, unspecified: Secondary | ICD-10-CM

## 2017-07-19 DIAGNOSIS — K219 Gastro-esophageal reflux disease without esophagitis: Secondary | ICD-10-CM

## 2017-07-19 DIAGNOSIS — F329 Major depressive disorder, single episode, unspecified: Secondary | ICD-10-CM | POA: Diagnosis not present

## 2017-07-19 DIAGNOSIS — F32A Depression, unspecified: Secondary | ICD-10-CM

## 2017-07-19 DIAGNOSIS — I10 Essential (primary) hypertension: Secondary | ICD-10-CM

## 2017-07-19 MED ORDER — SUCRALFATE 1 G PO TABS
1.0000 g | ORAL_TABLET | Freq: Four times a day (QID) | ORAL | 0 refills | Status: DC
Start: 2017-07-19 — End: 2017-10-27

## 2017-07-19 MED ORDER — ROSUVASTATIN CALCIUM 20 MG PO TABS: 20.0000 mg | ORAL_TABLET | Freq: Every day | ORAL | 0 refills | Status: DC

## 2017-07-19 MED ORDER — SERTRALINE HCL 100 MG PO TABS: 150.0000 mg | ORAL_TABLET | Freq: Every day | ORAL | 0 refills | Status: DC

## 2017-07-19 MED ORDER — OMEPRAZOLE 40 MG PO CPDR: 40.0000 mg | DELAYED_RELEASE_CAPSULE | Freq: Every day | ORAL | 0 refills | Status: DC

## 2017-07-19 MED ORDER — LORAZEPAM 0.5 MG PO TABS: 0.5000 mg | ORAL_TABLET | Freq: Every day | ORAL | 2 refills | Status: DC

## 2017-07-19 MED ORDER — LISINOPRIL 5 MG PO TABS: 5.0000 mg | ORAL_TABLET | Freq: Every day | ORAL | 0 refills | Status: DC

## 2017-07-19 MED ORDER — FLUTICASONE-SALMETEROL 250-50 MCG/DOSE IN AEPB: 1.0000 | INHALATION_SPRAY | Freq: Two times a day (BID) | RESPIRATORY_TRACT | 0 refills | Status: DC | PRN

## 2017-07-19 MED ORDER — NICOTINE 14 MG/24HR TD PT24: 14.0000 mg | MEDICATED_PATCH | Freq: Every day | TRANSDERMAL | 0 refills | Status: AC

## 2017-07-19 NOTE — Telephone Encounter (Signed)
Pt called back and asked about her lisinopril and Crestor refills

## 2017-07-19 NOTE — Progress Notes (Signed)
Name: Janice Wilson   MRN: [id number]    DOB: [birth year]   Date:07/19/2017       Progress Note  Subjective  Chief Complaint  Chief Complaint  Patient presents with  . Medication Refill  . Follow-up    3 months     Gastroesophageal Reflux  She complains of abdominal pain, coughing, heartburn and nausea. She reports no wheezing. This is a recurrent problem. The problem occurs frequently. The symptoms are aggravated by certain foods. Pertinent negatives include no anemia, fatigue or melena. She has tried a PPI (Sucralfate 1g QID.) for the symptoms. Past procedures include an EGD.  Anxiety  Presents for follow-up visit. Symptoms include depressed mood, excessive worry, insomnia and nausea. Patient reports no nervous/anxious behavior, panic or shortness of breath. The severity of symptoms is moderate and causing significant distress.   Her past medical history is significant for asthma.  Asthma  She complains of cough. There is no difficulty breathing, shortness of breath, sputum production or wheezing. This is a chronic problem. Associated symptoms include heartburn. Pertinent negatives include no headaches. Her past medical history is significant for asthma.  Depression         This is a chronic problem.  The onset quality is gradual. The problem is unchanged.  Associated symptoms include insomnia.  Associated symptoms include no fatigue, no helplessness, no headaches, no indigestion and not sad.  Past treatments include SSRIs - Selective serotonin reuptake inhibitors.  Compliance with treatment is good.  Past medical history includes anxiety.    Shunt wishes to try nicotine patches for smoking cessation, she smokes approximately half pack per day, in the past, she has tried Wellbutrin which did not work, she is not a candidate for Chantix because of history of depression.   Past Medical History:  Diagnosis Date  . Anxiety   . Asthma   . Dental crown present    dental implants - top,  front  . Depression   . GERD (gastroesophageal reflux disease)   . Glaucoma   . Headache(784.0)   . Heart murmur    followed by PCP  . Neck pain    s/p fusion.  limited side-to-side motion.  No limits up and down motion.  . Panic attack   . Shortness of breath   . Sleep apnea    uses CPAP machine sleep study 9/ 2012 done at sleep med in [place]  . Vertigo    no episodes in over 10 yrs  . Wears dentures    partial upper  . Wears hearing aid    left ear    Past Surgical History:  Procedure Laterality Date  . ANTERIOR CERVICAL DECOMP/DISCECTOMY FUSION  07/05/2011   Procedure: ANTERIOR CERVICAL DECOMPRESSION/DISCECTOMY FUSION 3 LEVELS;  Surgeon: Cooper Render Pool;  Location: [facility];  Service: Neurosurgery;  Laterality: N/A;  Cervical Four-Five, Cervical Five-Six, Cervical Six-Seven Anterior Cervical Decompression Fusion WITH ALLOGRAFT AND PLATING  . CESAREAN SECTION    . COLONOSCOPY WITH PROPOFOL N/A 04/16/2016   Procedure: COLONOSCOPY WITH PROPOFOL;  Surgeon: Lucilla Lame, MD;  Location: [facility];  Service: Endoscopy;  Laterality: N/A;  sleep apnea LATEX sensitivity  . ESOPHAGOGASTRODUODENOSCOPY (EGD) WITH PROPOFOL N/A 01/12/2017   Procedure: ESOPHAGOGASTRODUODENOSCOPY (EGD) WITH PROPOFOL;  Surgeon: Jonathon Bellows, MD;  Location: [facility];  Service: Endoscopy;  Laterality: N/A;  Latex sensitivity sleep apnea  . PARTIAL HYSTERECTOMY      Family History  Problem Relation Age of Onset  . Cancer Mother   .  Stroke Father   . Anesthesia problems Neg Hx   . Breast cancer Neg Hx     Social History   Socioeconomic History  . Marital status: Single    Spouse name: Not on file  . Number of children: Not on file  . Years of education: Not on file  . Highest education level: Not on file  Social Needs  . Financial resource strain: Not on file  . Food insecurity - worry: Not on file  . Food insecurity - inability: Not on file  . Transportation needs - medical:  Not on file  . Transportation needs - non-medical: Not on file  Occupational History  . Not on file  Tobacco Use  . Smoking status: Current Some Day Smoker    Packs/day: 0.20    Years: 21.00    Pack years: 4.20    Types: Cigarettes    Last attempt to quit: 02/19/2013    Years since quitting: 4.4  . Smokeless tobacco: Never Used  Substance and Sexual Activity  . Alcohol use: Yes    Alcohol/week: 0.0 oz    Comment: 1-2 beer a month(Holidays)  . Drug use: No  . Sexual activity: Yes  Other Topics Concern  . Not on file  Social History Narrative  . Not on file     Current Outpatient Medications:  .  acetaminophen (TYLENOL) 325 MG tablet, Take 325 mg by mouth every 6 (six) hours as needed. For pain , Disp: , Rfl:  .  albuterol (PROVENTIL HFA;VENTOLIN HFA) 108 (90 BASE) MCG/ACT inhaler, Inhale 1 puff into the lungs every 4 (four) hours as needed. For shortness of breath, Disp: , Rfl:  .  aspirin 81 MG tablet, Take 81 mg by mouth daily., Disp: , Rfl:  .  conjugated estrogens (PREMARIN) vaginal cream, Place vaginally., Disp: , Rfl:  .  doxycycline (VIBRA-TABS) 100 MG tablet, Take 1 tablet (100 mg total) by mouth 2 (two) times daily., Disp: 20 tablet, Rfl: 0 .  fluticasone (FLONASE) 50 MCG/ACT nasal spray, Place 2 sprays into the nose daily as needed. FOR CONGESTION , Disp: , Rfl:  .  Fluticasone-Salmeterol (ADVAIR) 250-50 MCG/DOSE AEPB, Inhale 1 puff into the lungs every 12 (twelve) hours as needed. FOR WHEEZING , Disp: , Rfl:  .  gabapentin (NEURONTIN) 300 MG capsule, TAKE ONE CAPSULE BY MOUTH 3 TIMES A DAY, Disp: 90 capsule, Rfl: 2 .  ibuprofen (ADVIL,MOTRIN) 800 MG tablet, Take 800 mg by mouth daily as needed. FOR PAIN , Disp: , Rfl:  .  latanoprost (XALATAN) 0.005 % ophthalmic solution, Place 1 drop into both eyes at bedtime.  , Disp: , Rfl:  .  lisinopril (PRINIVIL,ZESTRIL) 5 MG tablet, Take 1 tablet (5 mg total) by mouth daily., Disp: 90 tablet, Rfl: 0 .  LORazepam (ATIVAN) 0.5 MG  tablet, Take 1 tablet (0.5 mg total) by mouth at bedtime., Disp: 30 tablet, Rfl: 2 .  meclizine (ANTIVERT) 25 MG tablet, Take 25 mg by mouth 3 (three) times daily as needed. Reported on 07/03/2015, Disp: , Rfl:  .  meloxicam (MOBIC) 15 MG tablet, Take 1 tablet (15 mg total) by mouth daily., Disp: 30 tablet, Rfl: 2 .  montelukast (SINGULAIR) 10 MG tablet, TAKE 1 TABLET (10 MG TOTAL) BY MOUTH AT BEDTIME., Disp: 90 tablet, Rfl: 1 .  Multiple Vitamins-Minerals (MULTIPLE VITAMINS/WOMENS PO), Take 1 tablet by mouth daily., Disp: , Rfl:  .  NEOMYCIN-POLYMYXIN-HYDROCORTISONE (CORTISPORIN) 1 % SOLN OTIC solution, Apply 1-2 drops to toe BID  after soaking, Disp: 10 mL, Rfl: 1 .  omeprazole (PRILOSEC) 40 MG capsule, Take 1 capsule (40 mg total) by mouth daily., Disp: 90 capsule, Rfl: 0 .  rosuvastatin (CRESTOR) 20 MG tablet, Take 1 tablet (20 mg total) by mouth daily., Disp: 90 tablet, Rfl: 0 .  sertraline (ZOLOFT) 100 MG tablet, Take 1 tablet (100 mg total) by mouth at bedtime. (Patient taking differently: Take 150 mg by mouth at bedtime. ), Disp: 90 tablet, Rfl: 0 .  sucralfate (CARAFATE) 1 g tablet, TAKE 1 TABLET BY MOUTH 4 TIMES A DAY, Disp: 60 tablet, Rfl: 0 .  tiZANidine (ZANAFLEX) 2 MG tablet, Take 1 tablet (2 mg total) by mouth every 8 (eight) hours as needed for muscle spasms., Disp: 30 tablet, Rfl: 0 .  omeprazole (PRILOSEC) 40 MG capsule, Take 1 capsule (40 mg total) by mouth daily. (Patient not taking: Reported on 07/19/2017), Disp: 90 capsule, Rfl: 3 .  timolol (BETIMOL) 0.5 % ophthalmic solution, Place 1 drop into both eyes every morning.  , Disp: , Rfl:   Current Facility-Administered Medications:  .  mupirocin ointment (BACTROBAN) 2 %, , Topical, TID, Roselee Nova, MD  Allergies  Allergen Reactions  . Oxycodone Hcl Other (See Comments)    REACTION:"MAKES ME PALE"  . Propoxyphene Nausea And Vomiting  . Propoxyphene     Other reaction(s): Unknown  . Latex Rash    Irritation  (Condoms  and gloves)     Review of Systems  Constitutional: Negative for fatigue.  Respiratory: Positive for cough. Negative for sputum production, shortness of breath and wheezing.   Gastrointestinal: Positive for abdominal pain, heartburn and nausea. Negative for melena.  Neurological: Negative for headaches.  Psychiatric/Behavioral: Positive for depression. The patient has insomnia. The patient is not nervous/anxious.      Objective  Vitals:   07/19/17 0938  BP: 118/72  Pulse: 69  Resp: 16  Temp: 98.2 F (36.8 C)  TempSrc: Oral  SpO2: 94%  Weight: 163 lb (73.9 kg)    Physical Exam  Constitutional: She is oriented to person, place, and time and well-developed, well-nourished, and in no distress.  HENT:  Head: Normocephalic and atraumatic.  Cardiovascular: Normal rate, regular rhythm and normal heart sounds.  No murmur heard. Pulmonary/Chest: Effort normal and breath sounds normal. She has no wheezes.  Abdominal: Soft. Bowel sounds are normal. There is no tenderness.  Musculoskeletal: She exhibits no edema.  Neurological: She is alert and oriented to person, place, and time.  Psychiatric: Mood, memory, affect and judgment normal.  Nursing note and vitals reviewed.    Assessment & Plan  1. Anxiety and depression Symptoms of anxiety and depression are stable and responsive to sertraline taken every day, lorazepam taken as needed, refills provided - sertraline (ZOLOFT) 100 MG tablet; Take 1.5 tablets (150 mg total) by mouth at bedtime.  Dispense: 135 tablet; Refill: 0 - LORazepam (ATIVAN) 0.5 MG tablet; Take 1 tablet (0.5 mg total) by mouth at bedtime.  Dispense: 30 tablet; Refill: 2  2. Gastritis without bleeding, unspecified chronicity, unspecified gastritis type  - sucralfate (CARAFATE) 1 g tablet; Take 1 tablet (1 g total) by mouth 4 (four) times daily.  Dispense: 360 tablet; Refill: 0  3. Gastroesophageal reflux disease without esophagitis Stable and responsive to PPI  taken every day - omeprazole (PRILOSEC) 40 MG capsule; Take 1 capsule (40 mg total) by mouth daily.  Dispense: 90 capsule; Refill: 0  4. Mixed hyperlipidemia  - Lipid panel  5. Mild  intermittent asthma without complication  - Fluticasone-Salmeterol (ADVAIR) 250-50 MCG/DOSE AEPB; Inhale 1 puff into the lungs every 12 (twelve) hours as needed. FOR WHEEZING  Dispense: 66 each; Refill: 0  6. Tobacco abuse Will start on nicotine patches 14 mg 1 patch to be applied every day for recheck in 6 weeks - nicotine (NICODERM CQ - DOSED IN MG/24 HOURS) 14 mg/24hr patch; Place 1 patch (14 mg total) onto the skin daily.  Dispense: 42 patch; Refill: 0   Ahniya Mitchum [facility] 07/19/2017 9:43 AM

## 2017-07-19 NOTE — Telephone Encounter (Signed)
Copied from [place]. Topic: Quick Communication - See Telephone Encounter >> Jul 19, 2017  3:41 PM Oneta Rack wrote: CRM for notification. See Telephone encounter for:   07/19/17.  Relation to pt: self Call back number: [phone] [id number] [place], [place] - [address] [phone] ([phone] (Fax)    Reason for call:  Patient was seen today by Dr. Manuella Ghazi, pharmacy denied receiving  omeprazole (PRILOSEC) 40 MG capsule, sertraline (ZOLOFT) 100 MG tablet, sucralfate (CARAFATE) 1 g tablet seeking a verbal. Patient currently at pharmacy please advise >> Jul 19, 2017  3:44 PM Oneta Rack wrote: CRM for notification. See Telephone encounter for:   07/19/17.  Relation to pt: self Call back number: [phone] [id number] [place], [place] - [address] [phone] ([phone] (Fax)    Reason for call:  Patient was seen today by Dr. Manuella Ghazi, pharmacy denied receiving  omeprazole (PRILOSEC) 40 MG capsule, sertraline (ZOLOFT) 100 MG tablet, sucralfate (CARAFATE) 1 g tablet seeking a verbal. Patient currently at pharmacy please advise

## 2017-07-19 NOTE — Telephone Encounter (Signed)
Spoke to the patient she states they only received the patch and her inhaler but not her other refills. Called the pharmacy and they received all her refills. [name] states that one that she asked them for was the lisinopril. I saw that he did not refill it sent Dr. Manuella Ghazi a refill request

## 2017-07-20 ENCOUNTER — Ambulatory Visit: Payer: PPO | Admitting: Podiatry

## 2017-08-01 ENCOUNTER — Other Ambulatory Visit: Payer: Self-pay | Admitting: Family Medicine

## 2017-08-01 DIAGNOSIS — F329 Major depressive disorder, single episode, unspecified: Secondary | ICD-10-CM

## 2017-08-01 DIAGNOSIS — F32A Depression, unspecified: Secondary | ICD-10-CM

## 2017-08-01 DIAGNOSIS — F419 Anxiety disorder, unspecified: Secondary | ICD-10-CM

## 2017-08-02 NOTE — Telephone Encounter (Signed)
Will continue to try to reach to schedule appt

## 2017-08-11 ENCOUNTER — Ambulatory Visit (INDEPENDENT_AMBULATORY_CARE_PROVIDER_SITE_OTHER): Payer: PPO | Admitting: Family Medicine

## 2017-08-11 ENCOUNTER — Encounter: Payer: Self-pay | Admitting: Family Medicine

## 2017-08-11 VITALS — BP 116/62 | HR 91 | Temp 98.8°F | Resp 16 | Wt 160.9 lb

## 2017-08-11 DIAGNOSIS — L509 Urticaria, unspecified: Secondary | ICD-10-CM | POA: Diagnosis not present

## 2017-08-11 DIAGNOSIS — Z9189 Other specified personal risk factors, not elsewhere classified: Secondary | ICD-10-CM

## 2017-08-11 MED ORDER — TRIAMCINOLONE ACETONIDE 0.025 % EX OINT: 1.0000 "application " | TOPICAL_OINTMENT | Freq: Two times a day (BID) | CUTANEOUS | 0 refills | Status: DC

## 2017-08-11 MED ORDER — CETIRIZINE HCL 10 MG PO TABS: 10.0000 mg | ORAL_TABLET | Freq: Every day | ORAL | 0 refills | Status: DC
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
## 2017-08-17 ENCOUNTER — Other Ambulatory Visit: Payer: Self-pay

## 2017-08-17 DIAGNOSIS — G8929 Other chronic pain: Secondary | ICD-10-CM

## 2017-08-17 DIAGNOSIS — M542 Cervicalgia: Principal | ICD-10-CM

## 2017-08-17 NOTE — Telephone Encounter (Addendum)
Refill request for general medication. Mupirocin ointment and Gabapentin.   Last office visit: 08/11/2017  No Follow-up on file.

## 2017-08-18 MED ORDER — GABAPENTIN 300 MG PO CAPS: 300.0000 mg | ORAL_CAPSULE | Freq: Three times a day (TID) | ORAL | 0 refills | Status: DC

## 2017-08-22 ENCOUNTER — Other Ambulatory Visit: Payer: Self-pay | Admitting: Family Medicine

## 2017-08-22 DIAGNOSIS — Z1231 Encounter for screening mammogram for malignant neoplasm of breast: Secondary | ICD-10-CM

## 2017-08-24 DIAGNOSIS — H04123 Dry eye syndrome of bilateral lacrimal glands: Secondary | ICD-10-CM | POA: Diagnosis not present

## 2017-08-24 DIAGNOSIS — H2513 Age-related nuclear cataract, bilateral: Secondary | ICD-10-CM | POA: Insufficient documentation

## 2017-08-24 DIAGNOSIS — H40031 Anatomical narrow angle, right eye: Secondary | ICD-10-CM | POA: Diagnosis not present

## 2017-08-24 DIAGNOSIS — H402221 Chronic angle-closure glaucoma, left eye, mild stage: Secondary | ICD-10-CM | POA: Insufficient documentation

## 2017-09-04 ENCOUNTER — Other Ambulatory Visit: Payer: Self-pay | Admitting: Podiatry

## 2017-09-05 ENCOUNTER — Other Ambulatory Visit: Payer: Self-pay

## 2017-09-05 DIAGNOSIS — B9689 Other specified bacterial agents as the cause of diseases classified elsewhere: Secondary | ICD-10-CM

## 2017-09-05 DIAGNOSIS — L089 Local infection of the skin and subcutaneous tissue, unspecified: Secondary | ICD-10-CM

## 2017-09-05 MED ORDER — ROSUVASTATIN CALCIUM 20 MG PO TABS: 20.0000 mg | ORAL_TABLET | Freq: Every day | ORAL | 0 refills | Status: DC

## 2017-09-05 NOTE — Telephone Encounter (Signed)
Refill Request for Cholesterol medication. Rosuvastatin 5 mg     Mupirocin 2% ointment  Last physical: 08/10/2016  Lab Results  Component Value Date   CHOL 238 (H) 02/14/2017   HDL 48 (L) 02/14/2017   LDLCALC 173 (H) 02/14/2017   TRIG 87 02/14/2017   CHOLHDL 5.0 (H) 02/14/2017    Follow up: 12/06/2017

## 2017-09-07 ENCOUNTER — Other Ambulatory Visit: Payer: Self-pay

## 2017-09-07 NOTE — Telephone Encounter (Signed)
[facility]   Last office visit: 08/11/2017   Follow up on 12/06/2017

## 2017-09-29 ENCOUNTER — Ambulatory Visit
Admission: RE | Admit: 2017-09-29 | Discharge: 2017-09-29 | Disposition: A | Discharge status: Home / Self Care | Payer: PPO | Source: Ambulatory Visit | Attending: Family Medicine | Admitting: Family Medicine

## 2017-09-29 DIAGNOSIS — Z1231 Encounter for screening mammogram for malignant neoplasm of breast: Secondary | ICD-10-CM | POA: Diagnosis not present

## 2017-10-05 ENCOUNTER — Telehealth: Payer: Self-pay | Admitting: Family Medicine

## 2017-10-05 DIAGNOSIS — J3089 Other allergic rhinitis: Secondary | ICD-10-CM

## 2017-10-05 DIAGNOSIS — J4520 Mild intermittent asthma, uncomplicated: Secondary | ICD-10-CM

## 2017-10-05 DIAGNOSIS — I10 Essential (primary) hypertension: Secondary | ICD-10-CM

## 2017-10-05 NOTE — Telephone Encounter (Signed)
>>   Oct 05, 2017  4:01 PM Oneta Rack wrote:  Relation to pt: self Call back [phone] [id number] [place], [place] - [address] [phone] (Phone) [phone] (Fax)  Reason for call:   [place] and would like a new Rx of the following: montelukast (SINGULAIR) 10 MG tablet , lisinopril (PRINIVIL,ZESTRIL) 5 MG tablet and Fluticasone-Salmeterol (ADVAIR) 250-50 MCG/DOSE AEPB, patient aware please allow 48 to 72 hour turn around, please advise

## 2017-10-06 ENCOUNTER — Encounter: Payer: Self-pay | Admitting: Emergency Medicine

## 2017-10-06 ENCOUNTER — Emergency Department
Admission: EM | Admit: 2017-10-06 | Discharge: 2017-10-06 | Discharge status: Against Medical Advice | Payer: PPO | Attending: Emergency Medicine | Admitting: Emergency Medicine

## 2017-10-06 ENCOUNTER — Emergency Department: Payer: PPO

## 2017-10-06 ENCOUNTER — Other Ambulatory Visit: Payer: Self-pay

## 2017-10-06 ENCOUNTER — Telehealth: Payer: Self-pay | Admitting: Emergency Medicine

## 2017-10-06 DIAGNOSIS — Z5321 Procedure and treatment not carried out due to patient leaving prior to being seen by health care provider: Secondary | ICD-10-CM | POA: Insufficient documentation

## 2017-10-06 DIAGNOSIS — R079 Chest pain, unspecified: Secondary | ICD-10-CM | POA: Diagnosis not present

## 2017-10-06 LAB — BASIC METABOLIC PANEL
Anion gap: 6 (ref 5–15)
BUN: 15 mg/dL (ref 6–20)
CO2: 24 mmol/L (ref 22–32)
Calcium: 9.1 mg/dL (ref 8.9–10.3)
Chloride: 108 mmol/L (ref 101–111)
Creatinine, Ser: 0.66 mg/dL (ref 0.44–1.00)
GFR calc Af Amer: >60 mL/min (ref >60)
GFR calc non Af Amer: >60 mL/min (ref >60)
Glucose, Bld: 114 mg/dL — ABNORMAL HIGH (ref 65–99)
Potassium: 3.8 mmol/L (ref 3.5–5.1)
Sodium: 138 mmol/L (ref 135–145)

## 2017-10-06 LAB — CBC
HCT: 39.3 % (ref 35.0–47.0)
Hemoglobin: 13.4 g/dL (ref 12.0–16.0)
MCH: 30.5 pg (ref 26.0–34.0)
MCHC: 34.1 g/dL (ref 32.0–36.0)
MCV: 89.4 fL (ref 80.0–100.0)
Platelets: 204 10*3/uL (ref 150–440)
RBC: 4.39 MIL/uL (ref 3.80–5.20)
RDW: 14.4 % (ref 11.5–14.5)
WBC: 8.2 10*3/uL (ref 3.6–11.0)

## 2017-10-06 LAB — TROPONIN I: Troponin I: <0.03 ng/mL (ref <0.03)

## 2017-10-06 MED ORDER — LISINOPRIL 5 MG PO TABS: 5.0000 mg | ORAL_TABLET | Freq: Every day | ORAL | 0 refills | Status: DC

## 2017-10-06 MED ORDER — MONTELUKAST SODIUM 10 MG PO TABS: 10.0000 mg | ORAL_TABLET | Freq: Every day | ORAL | 0 refills | Status: DC

## 2017-10-06 MED ORDER — FLUTICASONE-SALMETEROL 250-50 MCG/DOSE IN AEPB: 1.0000 | INHALATION_SPRAY | Freq: Two times a day (BID) | RESPIRATORY_TRACT | 0 refills | Status: DC | PRN

## 2017-10-06 NOTE — Telephone Encounter (Signed)
Called patient due to lwot to inquire about condition and follow up plans. Patient says she thinks it was just gas.  I told her that she really needs to inform her pcp of the symptoms that she had.  She asked about the lab results.  It told her that they were okay, but that does not prove pain is not heart related.  I advised her to call pcp and inform pcp about symptoms and have them review the test results from here.  She agrees.

## 2017-10-06 NOTE — ED Notes (Signed)
Pt states she is feeling better, but will return if she has any other issues.Marland Kitchen

## 2017-10-06 NOTE — Telephone Encounter (Signed)
Called to inform patient Refills have been sent to her pharmacy. Patient was unavailable and left a voicemail to notify her.

## 2017-10-06 NOTE — Telephone Encounter (Signed)
Please review for refill of medications listed- [place] beach.

## 2017-10-06 NOTE — ED Triage Notes (Signed)
Pt in via POV with complaints of intermittent chest pain x a few weeks, denies radiation or any accompanying symptoms.  Vitals WDL, NAD noted at this time.

## 2017-10-07 ENCOUNTER — Telehealth: Payer: Self-pay | Admitting: Emergency Medicine

## 2017-10-07 NOTE — Telephone Encounter (Signed)
Patient called and staated that she went to ER and had EKG and labs. Patient stated she was having chest pains. Patient also stated she was told by ER to follow up with primary doctor today for recheck. Patient was only triaged and did not see a physician. When patient. called the office I informed her there was know available appointments and her Primary was not in office today. She was given appointment for Monday at 2:00 with Dr. Ancil Boozer and was informed to go back to ER or Urgent Care if she was having any problems or still having chest pains. Patient understood and stated the Lord told her she will be ok til Monday.

## 2017-10-10 ENCOUNTER — Ambulatory Visit: Payer: PPO | Admitting: Family Medicine

## 2017-10-12 ENCOUNTER — Other Ambulatory Visit: Payer: Self-pay | Admitting: *Deleted

## 2017-10-12 NOTE — Patient Outreach (Signed)
[place] [facility]) Care Management  10/12/2017  Janice Wilson 05-14-1956 [id number]   Telephone screening call #1  Referral received 4/22 Referral source : [facility] Referral reason : Assistance with medication co payments and transportation assistance. Discipline requested LCSW and pharmacist.  Insurance : [employer].   Successful Outreach call to patient, HIPAA information verified, explained reason for the call.  Patient discussed her she is feeling pretty good on today, keeping her grandchildren.  Conditions  Patient discussed recent ED visit related to chest pain, no  further chest pain, but states she believes her PCP will send her to a heart doctor.  Patient discussed medical conditions of previous neck surgery in 2013 and having 80% usage of her neck that limits some of her movements. Patient reports having Hypertension that is controlled and she monitors it at home.  Patient reports long history of Asthma that she  manages well, using rescue inhaler as needed.   Patient discussed she still smokes and want assistance resource information to boost her in quitting, she has tried nicotine patches in the past.  Patient discussed having a history of sleep apnea and wearing CPAP at night denies having problems with machine but she has had 2013 will discuss with PCP at visit.    Social  Patient lives at home she is mostly independent with ADL's, reports having difficulty with washing her back even with using an assistive back sponge. Patient discussed she cleans her house the best she can, and it would be good to have someone to help her with cooking, she mostly cooks something quick.packaged  and that is not the best for you wished she could get some help with that.  Discussed insurance plan does not cover personal care services.  Patient discussed she has a supportive  daughter but she has a busy  job and  children . Patient states she is able to drive at times when  her neck is not bothering her. Patient denies need for use of cane/walker for mobility wears braces on her knees.  Discussed recently cancelling MD office visit due to transportation needs.  Medication   Patient discussed taking her medication as prescribed, and having all medication prescribed. Patient discussed problem with cost of co payment of some medication especially Advair unable to state cost. Patient reports wanting information on the  side effects on the medication that she is taking may cause.   Appointments Patient has upcoming appointment with PCP on 5/9 at 1120.  Consent   Explained and offered [facility] care management services, patient is agreeable to LCSW consult for assistance with transportation concerns and available community resources for personal care services. Patient is also agreeable to Pharmacy services for medication cost concerns and side effects of medication that she is currently taking. Discussed with patient nursing care management services and benefits says she is doing okay at managing blood pressure is controlled,  declined need for telephone follow up or home visit. Patient is interested in receiving resource information on smoking cessation .   Plan  Will place LCSW consult for transportation needs and community resource for personal care service.  Will place Pharmacy consult for medication cost concern and wanting information on side effects of her medication.  Will send successful outreach letter and information on smoking cessation .    Joylene Draft, RN, [place] Management Coordinator  [phone]- Mobile [phone]- Toll Free Main Office

## 2017-10-17 ENCOUNTER — Encounter: Payer: Self-pay | Admitting: *Deleted

## 2017-10-18 ENCOUNTER — Encounter: Payer: Self-pay | Admitting: *Deleted

## 2017-10-18 ENCOUNTER — Other Ambulatory Visit: Payer: Self-pay

## 2017-10-18 ENCOUNTER — Ambulatory Visit: Payer: PPO | Admitting: Nurse Practitioner

## 2017-10-18 NOTE — Patient Outreach (Signed)
[place] [facility]) Care Management  10/18/2017  Janice Wilson 30-Apr-1956 [id number]    BSW contacted patient to discuss referral for transportation needs. HIPAA identifiers verified. BSW introduced self and social work role through [place] Management. BSW asked patient what her current mode of transportation is. Patient stated "I'm gonna have to call you back, I'm in an appointment".  Plan: BSW will place patient on schedule to call later this week if patient does not return call.  Daneen Schick, BSW, CDP Social Worker Cell # [phone] [name]

## 2017-10-19 ENCOUNTER — Other Ambulatory Visit: Payer: Self-pay

## 2017-10-19 DIAGNOSIS — F329 Major depressive disorder, single episode, unspecified: Secondary | ICD-10-CM

## 2017-10-19 DIAGNOSIS — F32A Depression, unspecified: Secondary | ICD-10-CM

## 2017-10-19 DIAGNOSIS — F419 Anxiety disorder, unspecified: Secondary | ICD-10-CM

## 2017-10-19 NOTE — Telephone Encounter (Signed)
Refill request for general medication: Sertraline hcl 100 mg  Last office visit: 08/11/2017  Last physical exam: None indicated  No follow-ups on file. 10/27/2017

## 2017-10-21 ENCOUNTER — Ambulatory Visit: Payer: Self-pay

## 2017-10-21 ENCOUNTER — Telehealth: Payer: Self-pay | Admitting: Pharmacist

## 2017-10-21 ENCOUNTER — Other Ambulatory Visit: Payer: Self-pay | Admitting: Podiatry

## 2017-10-21 MED ORDER — SERTRALINE HCL 100 MG PO TABS: 150.0000 mg | ORAL_TABLET | Freq: Every day | ORAL | 0 refills | Status: DC

## 2017-10-21 NOTE — Patient Outreach (Signed)
[facility]) Care Management  10/21/2017  Janice Wilson [birth year] [id number]   62 y.o. year old female referred to [facility] for Medication Management and Medication Assistance (Pharmacy telephone outreach)   Was unable to reach patient via telephone today and have left HIPAA compliant voicemail asking patient to return my call (unsuccessful outreach #1).  Plan: Will followup within 2 business days via telephone Will send 10 day unsuccessful outreach letter and allow patient 10 days to respond prior to closing case.     Carlean Jews, Pharm.D., BCPS PGY2 Ambulatory Care Pharmacy Resident Phone: [phone]

## 2017-10-23 ENCOUNTER — Emergency Department
Admission: EM | Admit: 2017-10-23 | Discharge: 2017-10-23 | Disposition: A | Discharge status: Home / Self Care | Payer: No Typology Code available for payment source | Attending: Emergency Medicine | Admitting: Emergency Medicine

## 2017-10-23 ENCOUNTER — Encounter: Payer: Self-pay | Admitting: Emergency Medicine

## 2017-10-23 ENCOUNTER — Emergency Department: Payer: No Typology Code available for payment source

## 2017-10-23 ENCOUNTER — Other Ambulatory Visit: Payer: Self-pay

## 2017-10-23 DIAGNOSIS — Y998 Other external cause status: Secondary | ICD-10-CM | POA: Diagnosis not present

## 2017-10-23 DIAGNOSIS — Z91040 Latex allergy status: Secondary | ICD-10-CM | POA: Insufficient documentation

## 2017-10-23 DIAGNOSIS — J45909 Unspecified asthma, uncomplicated: Secondary | ICD-10-CM | POA: Insufficient documentation

## 2017-10-23 DIAGNOSIS — F17210 Nicotine dependence, cigarettes, uncomplicated: Secondary | ICD-10-CM | POA: Insufficient documentation

## 2017-10-23 DIAGNOSIS — M549 Dorsalgia, unspecified: Secondary | ICD-10-CM | POA: Diagnosis not present

## 2017-10-23 DIAGNOSIS — S29019A Strain of muscle and tendon of unspecified wall of thorax, initial encounter: Secondary | ICD-10-CM | POA: Insufficient documentation

## 2017-10-23 DIAGNOSIS — Z7982 Long term (current) use of aspirin: Secondary | ICD-10-CM | POA: Insufficient documentation

## 2017-10-23 DIAGNOSIS — S3982XA Other specified injuries of lower back, initial encounter: Secondary | ICD-10-CM | POA: Diagnosis not present

## 2017-10-23 DIAGNOSIS — S161XXA Strain of muscle, fascia and tendon at neck level, initial encounter: Secondary | ICD-10-CM | POA: Diagnosis not present

## 2017-10-23 DIAGNOSIS — Y9389 Activity, other specified: Secondary | ICD-10-CM | POA: Diagnosis not present

## 2017-10-23 DIAGNOSIS — Z79899 Other long term (current) drug therapy: Secondary | ICD-10-CM | POA: Diagnosis not present

## 2017-10-23 DIAGNOSIS — Y9289 Other specified places as the place of occurrence of the external cause: Secondary | ICD-10-CM | POA: Insufficient documentation

## 2017-10-23 DIAGNOSIS — M545 Low back pain, unspecified: Secondary | ICD-10-CM

## 2017-10-23 DIAGNOSIS — S29012A Strain of muscle and tendon of back wall of thorax, initial encounter: Secondary | ICD-10-CM | POA: Diagnosis not present

## 2017-10-23 DIAGNOSIS — I10 Essential (primary) hypertension: Secondary | ICD-10-CM | POA: Insufficient documentation

## 2017-10-23 DIAGNOSIS — R202 Paresthesia of skin: Secondary | ICD-10-CM | POA: Insufficient documentation

## 2017-10-23 DIAGNOSIS — M546 Pain in thoracic spine: Secondary | ICD-10-CM | POA: Diagnosis not present

## 2017-10-23 DIAGNOSIS — S199XXA Unspecified injury of neck, initial encounter: Secondary | ICD-10-CM | POA: Diagnosis not present

## 2017-10-23 MED ORDER — NAPROXEN 500 MG PO TABS
500.0000 mg | ORAL_TABLET | Freq: Once | ORAL | Status: AC
  Administered 2017-10-23: 500 mg via ORAL
  Filled 2017-10-23: qty 1

## 2017-10-23 MED ORDER — CYCLOBENZAPRINE HCL 10 MG PO TABS
10.0000 mg | ORAL_TABLET | Freq: Once | ORAL | Status: AC
  Administered 2017-10-23: 10 mg via ORAL
  Filled 2017-10-23: qty 1

## 2017-10-23 MED ORDER — CYCLOBENZAPRINE HCL 10 MG PO TABS: 10.0000 mg | ORAL_TABLET | Freq: Three times a day (TID) | ORAL | 0 refills | Status: DC | PRN

## 2017-10-23 MED ORDER — NAPROXEN 500 MG PO TABS: 500.0000 mg | ORAL_TABLET | Freq: Two times a day (BID) | ORAL | 0 refills | Status: DC

## 2017-10-23 NOTE — Discharge Instructions (Signed)
Please follow-up with your primary care provider for symptoms that are not improving over the week. Return to the emergency department for symptoms of change or worsen if you are unable to schedule an appointment.
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
## 2017-10-24 ENCOUNTER — Other Ambulatory Visit: Payer: Self-pay
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
## 2017-10-24 NOTE — Patient Outreach (Signed)
[facility]) Care Management  10/24/2017  Janice Wilson Oct 23, 1955 [id number]  BSW attempted patient outreach on today's date. Call successful. Patient verified HIPAA identifiers. BSW began introducing service when patient stated "can you hang on I have [employer] on the other line". BSW offered to call at a later time, patient declined stating "we are finishing up". BSW waited on hold for 10 minutes. BSW had to end call, will attempt outreach later this week if no return call from patient.  Daneen Schick, BSW, CDP Social Worker Cell # [phone] [name]

## 2017-10-25 ENCOUNTER — Other Ambulatory Visit: Payer: Self-pay

## 2017-10-25 ENCOUNTER — Telehealth: Payer: Self-pay | Admitting: Pharmacist
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
## 2017-10-25 NOTE — Patient Outreach (Signed)
[facility]) Care Management  10/25/2017  Janice Wilson 1955-11-25 [id number]   BSW attempted to contact patient to verify transportation arrangements to her PCP on 10/27/17. BSW left HIPAA compliant voice message informing of pick up time. BSW asked patient to return call if questions regarding transportation.  Plan: BSW to outreach patient in the next month. Patient to contact patient if transportation needs arise.  Daneen Schick, BSW, CDP Social Worker Cell # [phone] [name]
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
## 2017-10-26 ENCOUNTER — Telehealth: Payer: Self-pay | Admitting: Pharmacist

## 2017-10-26 DIAGNOSIS — S39012A Strain of muscle, fascia and tendon of lower back, initial encounter: Secondary | ICD-10-CM | POA: Diagnosis not present

## 2017-10-26 DIAGNOSIS — S161XXA Strain of muscle, fascia and tendon at neck level, initial encounter: Secondary | ICD-10-CM | POA: Diagnosis not present

## 2017-10-26 DIAGNOSIS — G4700 Insomnia, unspecified: Secondary | ICD-10-CM | POA: Diagnosis not present

## 2017-10-26 DIAGNOSIS — F419 Anxiety disorder, unspecified: Secondary | ICD-10-CM | POA: Diagnosis not present

## 2017-10-26 NOTE — Patient Outreach (Signed)
[place] [facility]) Care Management  10/26/2017  CACHET MCCUTCHEN 07-29-1955 [id number]  62 y.o. year old female referred to [place] for Medication Assistance and Medication Management (Pharmacy telephone outreach)  Set up home visit for 11/04/17. Patient agrees to allow pharmacy student, Cleotis Lema attend appointment. Patient asks for "transportation lady" phone number. Transferred patient to Daneen Schick at [phone].   Carlean Jews, Pharm.D., BCPS PGY2 Ambulatory Care Pharmacy Resident Phone: [phone]

## 2017-10-27 ENCOUNTER — Ambulatory Visit (INDEPENDENT_AMBULATORY_CARE_PROVIDER_SITE_OTHER): Payer: PPO | Admitting: Family Medicine

## 2017-10-27 ENCOUNTER — Encounter: Payer: Self-pay | Admitting: Family Medicine

## 2017-10-27 ENCOUNTER — Other Ambulatory Visit: Payer: Self-pay

## 2017-10-27 VITALS — BP 138/86 | HR 67 | Temp 98.6°F | Resp 16 | Ht 65.0 in | Wt 153.2 lb

## 2017-10-27 DIAGNOSIS — F331 Major depressive disorder, recurrent, moderate: Secondary | ICD-10-CM | POA: Diagnosis not present

## 2017-10-27 DIAGNOSIS — Z720 Tobacco use: Secondary | ICD-10-CM | POA: Diagnosis not present

## 2017-10-27 DIAGNOSIS — R7303 Prediabetes: Secondary | ICD-10-CM

## 2017-10-27 DIAGNOSIS — K219 Gastro-esophageal reflux disease without esophagitis: Secondary | ICD-10-CM

## 2017-10-27 DIAGNOSIS — F411 Generalized anxiety disorder: Secondary | ICD-10-CM | POA: Diagnosis not present

## 2017-10-27 DIAGNOSIS — J4540 Moderate persistent asthma, uncomplicated: Secondary | ICD-10-CM

## 2017-10-27 DIAGNOSIS — E785 Hyperlipidemia, unspecified: Secondary | ICD-10-CM

## 2017-10-27 DIAGNOSIS — I10 Essential (primary) hypertension: Secondary | ICD-10-CM

## 2017-10-27 DIAGNOSIS — G8929 Other chronic pain: Secondary | ICD-10-CM

## 2017-10-27 DIAGNOSIS — M542 Cervicalgia: Secondary | ICD-10-CM

## 2017-10-27 DIAGNOSIS — J3089 Other allergic rhinitis: Secondary | ICD-10-CM | POA: Diagnosis not present

## 2017-10-27 MED ORDER — LISINOPRIL 5 MG PO TABS: 5.0000 mg | ORAL_TABLET | Freq: Every day | ORAL | 0 refills | Status: DC

## 2017-10-27 MED ORDER — MONTELUKAST SODIUM 10 MG PO TABS: 10.0000 mg | ORAL_TABLET | Freq: Every day | ORAL | 0 refills | Status: DC

## 2017-10-27 MED ORDER — GABAPENTIN 300 MG PO CAPS: 300.0000 mg | ORAL_CAPSULE | Freq: Every day | ORAL | 1 refills | Status: DC

## 2017-10-27 MED ORDER — HYDROXYZINE HCL 10 MG PO TABS: 10.0000 mg | ORAL_TABLET | Freq: Every evening | ORAL | 0 refills | Status: DC | PRN

## 2017-10-27 MED ORDER — FLUTICASONE-SALMETEROL 250-50 MCG/DOSE IN AEPB: 1.0000 | INHALATION_SPRAY | Freq: Two times a day (BID) | RESPIRATORY_TRACT | 1 refills | Status: DC | PRN
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
## 2017-10-27 NOTE — Patient Outreach (Signed)
[facility]) Care Management  10/27/2017  Janice Wilson 23-Mar-1956 [id number]  BSW received a voicemail from patient on 10/25/17 after normal office hours in regards to needed transportation for an [place] appointment. BSW was out of the office, as indicated on voicemail greeting, until 10/27/17. Patient did not call alternate contact covering but instead chose to leave voice recording.  BSW returned patients call on today's date. HIPAA identifiers verified. Patient stated she had been told by the pharmacist that this BSW did not plan to arrange any transportation until next month and she needed it now. BSW reminded patient of conversation on 10/24/17 regarding arranging transportation to her primary care appointment on today's date. BSW also reminded patient that as discussed, [facility] Care Management could only assist with transportation to and from primary care appointments or her neurologist due to recent ED visit.  Patient unable to recall specifics of conversation with BSW on Monday 10/24/17. BSW also mentioned voice message this BSW left for patient on Tuesday 10/25/17 reminding her of her appointment on today's date as well as confirming that transportation had been arranged and would pick her up at 10:45 am. Patient denied receiving voice message. After reviewing her voice messages patient apologized for not utilizing transportation confirming she did have that voice message.  Patient did attend her primary care appointment on today's date stating "I had to drive myself". Patient then inquired about [place] Management providing transportation on 11/06/17 to see her chiropractor. BSW spoke with patient and explained that [place] Management transportation services are available for patient's who are unable to attend appointments due to lack of resources. Patient became angry stating "I do have lack of resources". BSW spoke with patient about how on 10/25/17 a member of [facility] contacted patient  and she stated to them she "was driving". BSW also spoke about how patient was able to successfully drive to and from her physician appointment on today's date.  Patient continued to deny having resources stating "I can drive but it's hard". BSW reminded patient of the number provided to patient on 10/24/17 for [place] transit. Patient denies reaching out to this resource. BSW also spoke with patient about using a taxi service instead of driving her own personal vehicle on days she does not want to drive. Patient stated "I see you are going to be hard to work with and you're not helping me so I'm hanging up". Patient ended call.  Plan: BSW spoke with supervisor about conversation. Given that patient has appropriate resources to attend physician appointments BSW will resolve social work episode.  Daneen Schick, BSW, CDP Social Worker Cell # [phone] [name]

## 2017-10-28 LAB — COMPLETE METABOLIC PANEL WITH GFR
AG Ratio: 1.5 (calc) (ref 1.0–2.5)
ALT: 26 U/L (ref 6–29)
AST: 21 U/L (ref 10–35)
Albumin: 4.3 g/dL (ref 3.6–5.1)
Alkaline phosphatase (APISO): 96 U/L (ref 33–130)
BUN: 12 mg/dL (ref 7–25)
CO2: 31 mmol/L (ref 20–32)
Calcium: 9.5 mg/dL (ref 8.6–10.4)
Chloride: 104 mmol/L (ref 98–110)
Creat: 0.68 mg/dL (ref 0.50–1.05)
GFR, Est African American: 111 mL/min/{1.73_m2} (ref >=60)
GFR, Est Non African American: 96 mL/min/{1.73_m2} (ref >=60)
Globulin: 2.9 g/dL (calc) (ref 1.9–3.7)
Glucose, Bld: 73 mg/dL (ref 65–139)
Potassium: 4.4 mmol/L (ref 3.5–5.3)
Sodium: 140 mmol/L (ref 135–146)
Total Bilirubin: 0.5 mg/dL (ref 0.2–1.2)
Total Protein: 7.2 g/dL (ref 6.1–8.1)

## 2017-10-28 LAB — CBC WITH DIFFERENTIAL/PLATELET
Basophils Absolute: 28 cells/uL (ref 0–200)
Basophils Relative: 0.3 %
Eosinophils Absolute: 46 cells/uL (ref 15–500)
Eosinophils Relative: 0.5 %
HCT: 43 % (ref 35.0–45.0)
Hemoglobin: 14.4 g/dL (ref 11.7–15.5)
Lymphs Abs: 2962 cells/uL (ref 850–3900)
MCH: 30.1 pg (ref 27.0–33.0)
MCHC: 33.5 g/dL (ref 32.0–36.0)
MCV: 89.8 fL (ref 80.0–100.0)
MPV: 11 fL (ref 7.5–12.5)
Monocytes Relative: 5.3 %
Neutro Abs: 5676 cells/uL (ref 1500–7800)
Neutrophils Relative %: 61.7 %
Platelets: 215 10*3/uL (ref 140–400)
RBC: 4.79 10*6/uL (ref 3.80–5.10)
RDW: 13.6 % (ref 11.0–15.0)
Total Lymphocyte: 32.2 %
WBC mixed population: 488 cells/uL (ref 200–950)
WBC: 9.2 10*3/uL (ref 3.8–10.8)

## 2017-10-28 LAB — HEMOGLOBIN A1C
Hgb A1c MFr Bld: 5.7 % of total Hgb — ABNORMAL HIGH (ref <5.7)
Mean Plasma Glucose: 117 (calc)
eAG (mmol/L): 6.5 (calc)

## 2017-10-28 LAB — LIPID PANEL
Cholesterol: 147 mg/dL (ref <200)
HDL: 44 mg/dL — ABNORMAL LOW (ref >50)
LDL Cholesterol (Calc): 83 mg/dL (calc)
Non-HDL Cholesterol (Calc): 103 mg/dL (calc) (ref <130)
Total CHOL/HDL Ratio: 3.3 (calc) (ref <5.0)
Triglycerides: 100 mg/dL (ref <150)

## 2017-10-30 ENCOUNTER — Encounter: Payer: Self-pay | Admitting: Family Medicine

## 2017-11-03 DIAGNOSIS — M542 Cervicalgia: Secondary | ICD-10-CM | POA: Diagnosis not present

## 2017-11-03 DIAGNOSIS — M545 Low back pain: Secondary | ICD-10-CM | POA: Diagnosis not present

## 2017-11-04 ENCOUNTER — Other Ambulatory Visit: Payer: Self-pay | Admitting: Pharmacist
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
## 2017-11-08 ENCOUNTER — Other Ambulatory Visit: Payer: Self-pay | Admitting: *Deleted

## 2017-11-08 ENCOUNTER — Ambulatory Visit: Payer: PPO | Admitting: Family Medicine

## 2017-11-08 NOTE — Patient Outreach (Signed)
[facility]) Care Management  11/08/2017  Janice Wilson 1956/02/05 [id number]  Successful telephone encounter to Janice Wilson, 62 year old female for follow up on referral from [place] on  11/04/17.   Spoke with pt, HIPAA identifiers verified, discussed purpose of call.   Discussed with pt Issue of  CPAP machine stuck on one setting, need to call prescribing MD or DME company who provided CPAP to   Which pt  said she would do.   RN CM also discussed with pt  case management needs (hx of HTN,  hyperlipidemia) to  which pt reports she told pharmacist she needs help with this.  RN CM discussed with pt  Doing a home visit next week to which pt agreed.   Plan:  As discussed with pt, plan to follow up next week for initial home visit.    [name].   [place] Care Management  [phone]

## 2017-11-09 ENCOUNTER — Encounter: Payer: Self-pay | Admitting: Pharmacist

## 2017-11-09 ENCOUNTER — Other Ambulatory Visit: Payer: Self-pay | Admitting: *Deleted

## 2017-11-09 DIAGNOSIS — M9981 Other biomechanical lesions of cervical region: Secondary | ICD-10-CM | POA: Diagnosis not present

## 2017-11-09 NOTE — Patient Outreach (Signed)
[place] [facility]) Care Management  11/09/2017  Janice Wilson 03-11-1956 [id number]   Patient referred to this social worker by [facility] Pharmacist to assist patient with her Advanced Directive. Patient stated that she was on a long distance call and needed to call this social worker back. Per patient, if she does not call today she would call back in the morning.   Sheralyn Boatman [facility] Care Management [phone]

## 2017-11-10 ENCOUNTER — Encounter: Payer: Self-pay | Admitting: *Deleted

## 2017-11-10 NOTE — Patient Outreach (Signed)
[place] [facility]) Care Management  11/10/2017  Janice Wilson May 30, 1956 [id number]  BSW mailed patient an Advanced Directive packet at the request of [place], [employer].  Daneen Schick, BSW, CDP Social Worker Cell # [phone] [email or web address]

## 2017-11-11 ENCOUNTER — Other Ambulatory Visit: Payer: Self-pay | Admitting: *Deleted
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
## 2017-11-15 ENCOUNTER — Other Ambulatory Visit: Payer: Self-pay | Admitting: *Deleted

## 2017-11-15 ENCOUNTER — Ambulatory Visit: Payer: PPO | Admitting: *Deleted

## 2017-11-15 ENCOUNTER — Other Ambulatory Visit: Payer: Self-pay | Admitting: Family Medicine

## 2017-11-15 DIAGNOSIS — F329 Major depressive disorder, single episode, unspecified: Secondary | ICD-10-CM

## 2017-11-15 DIAGNOSIS — F32A Depression, unspecified: Secondary | ICD-10-CM

## 2017-11-15 DIAGNOSIS — F419 Anxiety disorder, unspecified: Principal | ICD-10-CM

## 2017-11-15 NOTE — Telephone Encounter (Signed)
[facility] request for general [facility]. [facility]  Last office visit: 10/27/2017   Follow up on 12/06/17
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
## 2017-11-16 ENCOUNTER — Other Ambulatory Visit: Payer: Self-pay | Admitting: *Deleted

## 2017-11-16 NOTE — Patient Outreach (Signed)
[place] [facility]) Care Management  11/16/2017  Janice Wilson Oct 23, 1955 [id number]   Phone call to patient on 11/15/17 to discuss assistance needs with completing her Advanced Directive. The document was mailed to patient's home, however per patient she had not received the document yet. Patient states that once she received, she will review it for potential questions.  This Education officer, museum openly explored any concerns that patient may have in working with this Education officer, museum. Patient was apologetic in discussing her past behavior and negative comments regarding her refusal to work with the previous [facility] Education officer, museum. Per patient, she realizes that there was a misunderstanding. Per patient, she has received the community resources requested to assist with her transportation needs.   Plan: This social worker will follow up with patient on Friday regarding assistance with her Advanced Directive.    Sheralyn Boatman [facility] Care Management [phone]
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
## 2017-11-17 ENCOUNTER — Other Ambulatory Visit: Payer: Self-pay

## 2017-11-17 NOTE — Patient Outreach (Signed)
This encounter was created in error - please disregard.

## 2017-11-18 ENCOUNTER — Telehealth: Payer: Self-pay | Admitting: Pharmacist

## 2017-11-18 ENCOUNTER — Other Ambulatory Visit: Payer: Self-pay | Admitting: *Deleted

## 2017-11-18 NOTE — Patient Outreach (Signed)
[facility]) Care Management  11/18/2017  Janice Wilson [birth year] [id number]   Return phone call from patient. Per patient, she is requesting assistance with finding housing. Patient states that she was recently approved for Section 8, and given a list of housing locations but would like recommendations and assistance with identifying the best locations as she is not familiar with [facility].  This Education officer, museum explained that housing lists  could be provided for her to look into independently, however I could  not make specific recommendations or transport her to view the listings.It was suggested that she request assistance from family or friends so that her Section  voucher would be utilized before it expires. Patient responded with frustration, stating that she had a friend that worked with an agency that actively assisted with housing but she did not know the name of the agency. Patient then asked "well what do you all do".  This social worker explained the role of the [facility] social worker and the function of community resource linkage. Patient verbalized no additional social work needs. Advanced Directive packet mailed to patient's home. Patient welcome to contact this Education officer, museum with questions.  Patient verbalized no additional community resource needs. Patient to be closed to [facility] care management at this time.   Sheralyn Boatman [facility] Care Management [phone]

## 2017-11-18 NOTE — Patient Outreach (Signed)
[place] [facility]) Care Management  11/18/2017  Janice Wilson Feb 27, 1956 [id number]   Phone call to patient to discuss assistance with completing her advanced directive as well as any additional community resource needs. Patient stated that she was in the bank and needed to call this social worker back.   Sheralyn Boatman [facility] Care Management [phone]

## 2017-11-18 NOTE — Patient Outreach (Signed)
[place] [facility]) Care Management  11/18/2017  KRYSTINE PABST 03-03-1956 [id number]   Called patient to follow up on home visit that was cut short. Patient denies further pharmacy assessment or assistance but does inquire about taking cyclobenzaprine and tizanidine together. Advised that she should choose one muscle relaxer or the other and not combine to reduce risk of excessive sedation. Patient confirms that she has been rinsing out her mouth after advair use but reports she is swallowing the mouth rinse instead of spitting it out. Counseled on appropriate administration technique. Patient requests help with housing. Referred patient to social worker, [employer], that has already been working with her. Provided patient with her number. Patient agrees to call me if there are any future pharmacy issues. Will sign off.  Carlean Jews, Pharm.D., BCPS PGY2 Ambulatory Care Pharmacy Resident Phone: [phone]

## 2017-11-25 ENCOUNTER — Ambulatory Visit: Payer: PPO

## 2017-11-25 ENCOUNTER — Other Ambulatory Visit: Payer: Self-pay | Admitting: Family Medicine

## 2017-11-25 NOTE — Telephone Encounter (Signed)
Copied from [place] [phone]. Topic: Quick Communication - Rx Refill/Question >> Nov 25, 2017  2:54 PM Waylan Rocher, Lumin L wrote: Medication: rosuvastatin (CRESTOR) 20 MG tablet, montelukast (SINGULAIR) 10 MG tablet  Has the patient contacted their pharmacy? Yes.   (Agent: If no, request that the patient contact the pharmacy for the refill.) (Agent: If yes, when and what did the pharmacy advise?)  Preferred Pharmacy (with phone number or street name): [id number] [place], [place] - [address] [place] [address] Phone: [phone] Fax: [phone]  Agent: Please be advised that RX refills may take up to 3 business days. We ask that you follow-up with your pharmacy.

## 2017-11-27 MED ORDER — ROSUVASTATIN CALCIUM 20 MG PO TABS: 20.0000 mg | ORAL_TABLET | Freq: Every day | ORAL | 0 refills | Status: DC

## 2017-11-28 ENCOUNTER — Other Ambulatory Visit: Payer: Self-pay | Admitting: Family Medicine

## 2017-11-28 DIAGNOSIS — F411 Generalized anxiety disorder: Secondary | ICD-10-CM

## 2017-12-06 ENCOUNTER — Ambulatory Visit (INDEPENDENT_AMBULATORY_CARE_PROVIDER_SITE_OTHER): Payer: PPO | Admitting: Family Medicine

## 2017-12-06 ENCOUNTER — Encounter: Payer: Self-pay | Admitting: Family Medicine

## 2017-12-06 ENCOUNTER — Ambulatory Visit (INDEPENDENT_AMBULATORY_CARE_PROVIDER_SITE_OTHER): Payer: PPO

## 2017-12-06 VITALS — BP 100/60 | HR 62 | Temp 98.8°F | Resp 12 | Ht 65.0 in | Wt 161.0 lb

## 2017-12-06 DIAGNOSIS — Z598 Other problems related to housing and economic circumstances: Secondary | ICD-10-CM

## 2017-12-06 DIAGNOSIS — Z9181 History of falling: Secondary | ICD-10-CM

## 2017-12-06 DIAGNOSIS — Z0000 Encounter for general adult medical examination without abnormal findings: Secondary | ICD-10-CM

## 2017-12-06 DIAGNOSIS — I10 Essential (primary) hypertension: Secondary | ICD-10-CM

## 2017-12-06 DIAGNOSIS — Z599 Problem related to housing and economic circumstances, unspecified: Secondary | ICD-10-CM

## 2017-12-06 NOTE — Patient Instructions (Signed)
Janice Wilson , Thank you for taking time to come for your Medicare Wellness Visit. I appreciate your ongoing commitment to your health goals. Please review the following plan we discussed and let me know if I can assist you in the future.   Screening recommendations/referrals: Colorectal Screening: Up to date Mammogram: Up to date Bone Density: Not yet required Lung Cancer Screening: You do not qualify for this screening Hepatitis C Screening: Up to date  Vision and Dental Exams: Recommended annual ophthalmology exams for early detection of glaucoma and other disorders of the eye Recommended annual dental exams for proper oral hygiene  Vaccinations: Influenza vaccine: Up to date Pneumococcal vaccine: Up to date Tdap vaccine: Up to date Shingles vaccine: Please call your insurance company to determine your out of pocket expense for the Shingrix vaccine. You may also receive this vaccine at your local pharmacy or Health Dept.  Advanced directives: Advance directive discussed with you today. I have provided a copy for you to complete at home and have notarized. Once this is complete please bring a copy in to our office so we can scan it into your chart.  Goals: Recommend to drink at least 6-8 8oz glasses of water per day.  Next appointment: Please schedule your Annual Wellness Visit with your Nurse Health Advisor in one year.  Preventive Care 40-64 Years, Female Preventive care refers to lifestyle choices and visits with your health care provider that can promote health and wellness. What does preventive care include?  A yearly physical exam. This is also called an annual well check.  Dental exams once or twice a year.  Routine eye exams. Ask your health care provider how often you should have your eyes checked.  Personal lifestyle choices, including:  Daily care of your teeth and gums.  Regular physical activity.  Eating a healthy diet.  Avoiding tobacco and drug  use.  Limiting alcohol use.  Practicing safe sex.  Taking low-dose aspirin daily starting at age 1.  Taking vitamin and mineral supplements as recommended by your health care provider. What happens during an annual well check? The services and screenings done by your health care provider during your annual well check will depend on your age, overall health, lifestyle risk factors, and family history of disease. Counseling  Your health care provider may ask you questions about your:  Alcohol use.  Tobacco use.  Drug use.  Emotional well-being.  Home and relationship well-being.  Sexual activity.  Eating habits.  Work and work Statistician.  Method of birth control.  Menstrual cycle.  Pregnancy history. Screening  You may have the following tests or measurements:  Height, weight, and BMI.  Blood pressure.  Lipid and cholesterol levels. These may be checked every 5 years, or more frequently if you are over 7 years old.  Skin check.  Lung cancer screening. You may have this screening every year starting at age 51 if you have a 30-pack-year history of smoking and currently smoke or have quit within the past 15 years.  Fecal occult blood test (FOBT) of the stool. You may have this test every year starting at age 46.  Flexible sigmoidoscopy or colonoscopy. You may have a sigmoidoscopy every 5 years or a colonoscopy every 10 years starting at age 71.  Hepatitis C blood test.  Hepatitis B blood test.  Sexually transmitted disease (STD) testing.  Diabetes screening. This is done by checking your blood sugar (glucose) after you have not eaten for a while (fasting). You  may have this done every 1-3 years.  Mammogram. This may be done every 1-2 years. Talk to your health care provider about when you should start having regular mammograms. This may depend on whether you have a family history of breast cancer.  BRCA-related cancer screening. This may be done if you  have a family history of breast, ovarian, tubal, or peritoneal cancers.  Pelvic exam and Pap test. This may be done every 3 years starting at age 40. Starting at age 60, this may be done every 5 years if you have a Pap test in combination with an HPV test.  Bone density scan. This is done to screen for osteoporosis. You may have this scan if you are at high risk for osteoporosis. Discuss your test results, treatment options, and if necessary, the need for more tests with your health care provider. Vaccines  Your health care provider may recommend certain vaccines, such as:  Influenza vaccine. This is recommended every year.  Tetanus, diphtheria, and acellular pertussis (Tdap, Td) vaccine. You may need a Td booster every 10 years.  Zoster vaccine. You may need this after age 78.  Pneumococcal 13-valent conjugate (PCV13) vaccine. You may need this if you have certain conditions and were not previously vaccinated.  Pneumococcal polysaccharide (PPSV23) vaccine. You may need one or two doses if you smoke cigarettes or if you have certain conditions. Talk to your health care provider about which screenings and vaccines you need and how often you need them. This information is not intended to replace advice given to you by your health care provider. Make sure you discuss any questions you have with your health care provider. Document Released: 07/04/2015 Document Revised: 02/25/2016 Document Reviewed: 04/08/2015 Elsevier Interactive Patient Education  2017 [place] Prevention in the Home Falls can cause injuries. They can happen to people of all ages. There are many things you can do to make your home safe and to help prevent falls. What can I do on the outside of my home?  Regularly fix the edges of walkways and driveways and fix any cracks.  Remove anything that might make you trip as you walk through a door, such as a raised step or threshold.  Trim any bushes or trees on  the path to your home.  Use bright outdoor lighting.  Clear any walking paths of anything that might make someone trip, such as rocks or tools.  Regularly check to see if handrails are loose or broken. Make sure that both sides of any steps have handrails.  Any raised decks and porches should have guardrails on the edges.  Have any leaves, snow, or ice cleared regularly.  Use sand or salt on walking paths during winter.  Clean up any spills in your garage right away. This includes oil or grease spills. What can I do in the bathroom?  Use night lights.  Install grab bars by the toilet and in the tub and shower. Do not use towel bars as grab bars.  Use non-skid mats or decals in the tub or shower.  If you need to sit down in the shower, use a plastic, non-slip stool.  Keep the floor dry. Clean up any water that spills on the floor as soon as it happens.  Remove soap buildup in the tub or shower regularly.  Attach bath mats securely with double-sided non-slip rug tape.  Do not have throw rugs and other things on the floor that can make you  trip. What can I do in the bedroom?  Use night lights.  Make sure that you have a light by your bed that is easy to reach.  Do not use any sheets or blankets that are too big for your bed. They should not hang down onto the floor.  Have a firm chair that has side arms. You can use this for support while you get dressed.  Do not have throw rugs and other things on the floor that can make you trip. What can I do in the kitchen?  Clean up any spills right away.  Avoid walking on wet floors.  Keep items that you use a lot in easy-to-reach places.  If you need to reach something above you, use a strong step stool that has a grab bar.  Keep electrical cords out of the way.  Do not use floor polish or wax that makes floors slippery. If you must use wax, use non-skid floor wax.  Do not have throw rugs and other things on the floor that  can make you trip. What can I do with my stairs?  Do not leave any items on the stairs.  Make sure that there are handrails on both sides of the stairs and use them. Fix handrails that are broken or loose. Make sure that handrails are as long as the stairways.  Check any carpeting to make sure that it is firmly attached to the stairs. Fix any carpet that is loose or worn.  Avoid having throw rugs at the top or bottom of the stairs. If you do have throw rugs, attach them to the floor with carpet tape.  Make sure that you have a light switch at the top of the stairs and the bottom of the stairs. If you do not have them, ask someone to add them for you. What else can I do to help prevent falls?  Wear shoes that:  Do not have high heels.  Have rubber bottoms.  Are comfortable and fit you well.  Are closed at the toe. Do not wear sandals.  If you use a stepladder:  Make sure that it is fully opened. Do not climb a closed stepladder.  Make sure that both sides of the stepladder are locked into place.  Ask someone to hold it for you, if possible.  Clearly mark and make sure that you can see:  Any grab bars or handrails.  First and last steps.  Where the edge of each step is.  Use tools that help you move around (mobility aids) if they are needed. These include:  Canes.  Walkers.  Scooters.  Crutches.  Turn on the lights when you go into a dark area. Replace any light bulbs as soon as they burn out.  Set up your furniture so you have a clear path. Avoid moving your furniture around.  If any of your floors are uneven, fix them.  If there are any pets around you, be aware of where they are.  Review your medicines with your doctor. Some medicines can make you feel dizzy. This can increase your chance of falling. Ask your doctor what other things that you can do to help prevent falls. This information is not intended to replace advice given to you by your health care  provider. Make sure you discuss any questions you have with your health care provider. Document Released: 04/03/2009 Document Revised: 11/13/2015 Document Reviewed: 07/12/2014 Elsevier Interactive Patient Education  2017 [employer].   Smoking  Tobacco Information Smoking tobacco will very likely harm your health. Tobacco contains a poisonous (toxic), colorless chemical called nicotine. Nicotine affects the brain and makes tobacco addictive. This change in your brain can make it hard to stop smoking. Tobacco also has other toxic chemicals that can hurt your body and raise your risk of many cancers. How can smoking tobacco affect me? Smoking tobacco can increase your chances of having serious health conditions, such as:  Cancer. Smoking is most commonly associated with lung cancer, but can lead to cancer in other parts of the body.  Chronic obstructive pulmonary disease (COPD). This is a long-term lung condition that makes it hard to breathe. It also gets worse over time.  High blood pressure (hypertension), heart disease, stroke, or heart attack.  Lung infections, such as pneumonia.  Cataracts. This is when the lenses in the eyes become clouded.  Digestive problems. This may include peptic ulcers, heartburn, and gastroesophageal reflux disease (GERD).  Oral health problems, such as gum disease and tooth loss.  Loss of taste and smell.  Smoking can affect your appearance by causing:  Wrinkles.  Yellow or stained teeth, fingers, and fingernails.  Smoking tobacco can also affect your social life.  Many workplaces, [employer], hotels, and public places are tobacco-free. This means that you may experience challenges in finding places to smoke when away from home.  The cost of a smoking habit can be expensive. Expenses for someone who smokes come in two ways: ? You spend money on a regular basis to buy tobacco. ? Your health care costs in the long-term are higher if you  smoke.  Tobacco smoke can also affect the health of those around you. Children of smokers have greater chances of: ? Sudden infant death syndrome (SIDS). ? Ear infections. ? Lung infections.  What lifestyle changes can be made?  Do not start smoking. Quit if you already do.  To quit smoking: ? Make a plan to quit smoking and commit yourself to it. Look for programs to help you and ask your health care provider for recommendations and ideas. ? Talk with your health care provider about using nicotine replacement medicines to help you quit. Medicine replacement medicines include gum, lozenges, patches, sprays, or pills. ? Do not replace cigarette smoking with electronic cigarettes, which are commonly called e-cigarettes. The safety of e-cigarettes is not known, and some may contain harmful chemicals. ? Avoid places, people, or situations that tempt you to smoke. ? If you try to quit but return to smoking, don't give up hope. It is very common for people to try a number of times before they fully succeed. When you feel ready again, give it another try.  Quitting smoking might affect the way you eat as well as your weight. Be prepared to monitor your eating habits. Get support in planning and following a healthy diet.  Ask your health care provider about having regular tests (screenings) to check for cancer. This may include blood tests, imaging tests, and other tests.  Exercise regularly. Consider taking walks, joining a gym, or doing yoga or exercise classes.  Develop skills to manage your stress. These skills include meditation. What are the benefits of quitting smoking? By quitting smoking, you may:  Lower your risk of getting cancer and other diseases caused by smoking.  Live longer.  Breathe better.  Lower your blood pressure and heart rate.  Stop your addiction to tobacco.  Stop creating secondhand smoke that hurts other people.  Improve your sense  of taste and  smell.  Look better over time, due to having fewer wrinkles and less staining.  What can happen if changes are not made? If you do not stop smoking, you may:  Get cancer and other diseases.  Develop COPD or other long-term (chronic) lung conditions.  Develop serious problems with your heart and blood vessels (cardiovascular system).  Need more tests to screen for problems caused by smoking.  Have higher, long-term healthcare costs from medicines or treatments related to smoking.  Continue to have worsening changes in your lungs, mouth, and nose.  Where to find support: To get support to quit smoking, consider:  Asking your health care provider for more information and resources.  Taking classes to learn more about quitting smoking.  Looking for local organizations that offer resources about quitting smoking.  Joining a support group for people who want to quit smoking in your local community.  Where to find more information: You may find more information about quitting smoking from:  HelpGuide.org: www.helpguide.org/articles/addictions/how-to-quit-smoking.htm  https://hall.com/: smokefree.gov  American Lung Association: www.lung.org  Contact a health care provider if:  You have problems breathing.  Your lips, nose, or fingers turn blue.  You have chest pain.  You are coughing up blood.  You feel faint or you pass out.  You have other noticeable changes that cause you to worry. Summary  Smoking tobacco can negatively affect your health, the health of those around you, your finances, and your social life.  Do not start smoking. Quit if you already do. If you need help quitting, ask your health care provider.  Think about joining a support group for people who want to quit smoking in your local community. There are many effective programs that will help you to quit this behavior. This information is not intended to replace advice given to you by your health care  provider. Make sure you discuss any questions you have with your health care provider. Document Released: 06/22/2016 Document Revised: 06/22/2016 Document Reviewed: 06/22/2016 Elsevier Interactive Patient Education  [employer].
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
## 2017-12-07 DIAGNOSIS — M546 Pain in thoracic spine: Secondary | ICD-10-CM | POA: Diagnosis not present

## 2017-12-07 DIAGNOSIS — M542 Cervicalgia: Secondary | ICD-10-CM | POA: Diagnosis not present

## 2017-12-07 NOTE — Progress Notes (Signed)
She left without being seen.

## 2017-12-08 ENCOUNTER — Ambulatory Visit: Payer: PPO | Admitting: Family Medicine

## 2017-12-21 ENCOUNTER — Encounter: Payer: Self-pay | Admitting: Nurse Practitioner

## 2017-12-21 ENCOUNTER — Ambulatory Visit (INDEPENDENT_AMBULATORY_CARE_PROVIDER_SITE_OTHER): Payer: PPO | Admitting: Nurse Practitioner

## 2017-12-21 VITALS — BP 130/80 | HR 76 | Temp 98.3°F | Resp 16 | Ht 65.0 in | Wt 162.5 lb

## 2017-12-21 DIAGNOSIS — F32A Depression, unspecified: Secondary | ICD-10-CM

## 2017-12-21 DIAGNOSIS — F329 Major depressive disorder, single episode, unspecified: Secondary | ICD-10-CM

## 2017-12-21 DIAGNOSIS — F419 Anxiety disorder, unspecified: Secondary | ICD-10-CM | POA: Diagnosis not present

## 2017-12-21 DIAGNOSIS — F809 Developmental disorder of speech and language, unspecified: Secondary | ICD-10-CM | POA: Diagnosis not present

## 2017-12-21 DIAGNOSIS — S0990XD Unspecified injury of head, subsequent encounter: Secondary | ICD-10-CM | POA: Diagnosis not present

## 2017-12-21 DIAGNOSIS — M26622 Arthralgia of left temporomandibular joint: Secondary | ICD-10-CM

## 2017-12-21 DIAGNOSIS — R41840 Attention and concentration deficit: Secondary | ICD-10-CM

## 2017-12-21 MED ORDER — SERTRALINE HCL 100 MG PO TABS: 100.0000 mg | ORAL_TABLET | Freq: Every day | ORAL | 0 refills | Status: DC

## 2017-12-21 MED ORDER — SERTRALINE HCL 100 MG PO TABS: 200.0000 mg | ORAL_TABLET | Freq: Every day | ORAL | 0 refills | Status: DC
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
## 2017-12-28 ENCOUNTER — Encounter: Payer: Self-pay | Admitting: Family Medicine

## 2017-12-30 ENCOUNTER — Other Ambulatory Visit: Payer: Self-pay | Admitting: Family Medicine

## 2017-12-30 DIAGNOSIS — F411 Generalized anxiety disorder: Secondary | ICD-10-CM

## 2018-01-01 ENCOUNTER — Other Ambulatory Visit: Payer: Self-pay | Admitting: Family Medicine

## 2018-01-01 DIAGNOSIS — F331 Major depressive disorder, recurrent, moderate: Secondary | ICD-10-CM

## 2018-01-05 ENCOUNTER — Ambulatory Visit
Admission: RE | Admit: 2018-01-05 | Discharge: 2018-01-05 | Disposition: A | Discharge status: Home / Self Care | Payer: PPO | Source: Ambulatory Visit | Attending: Nurse Practitioner | Admitting: Nurse Practitioner

## 2018-01-05 DIAGNOSIS — S0990XA Unspecified injury of head, initial encounter: Secondary | ICD-10-CM | POA: Diagnosis not present

## 2018-01-05 DIAGNOSIS — X58XXXD Exposure to other specified factors, subsequent encounter: Secondary | ICD-10-CM | POA: Insufficient documentation

## 2018-01-05 DIAGNOSIS — S0990XD Unspecified injury of head, subsequent encounter: Secondary | ICD-10-CM | POA: Diagnosis not present

## 2018-01-05 DIAGNOSIS — R41840 Attention and concentration deficit: Secondary | ICD-10-CM | POA: Diagnosis not present

## 2018-01-27 DIAGNOSIS — M9981 Other biomechanical lesions of cervical region: Secondary | ICD-10-CM | POA: Diagnosis not present

## 2018-02-02 ENCOUNTER — Ambulatory Visit: Payer: PPO

## 2018-02-06 ENCOUNTER — Encounter: Payer: Self-pay | Admitting: Family Medicine

## 2018-02-06 ENCOUNTER — Other Ambulatory Visit (HOSPITAL_COMMUNITY)
Admission: RE | Admit: 2018-02-06 | Discharge: 2018-02-06 | Disposition: A | Discharge status: Home / Self Care | Payer: PPO | Source: Ambulatory Visit | Attending: Family Medicine | Admitting: Family Medicine

## 2018-02-06 ENCOUNTER — Ambulatory Visit (INDEPENDENT_AMBULATORY_CARE_PROVIDER_SITE_OTHER): Payer: PPO | Admitting: Family Medicine

## 2018-02-06 VITALS — BP 110/72 | HR 77 | Temp 99.1°F | Resp 16 | Ht 65.0 in | Wt 154.1 lb

## 2018-02-06 DIAGNOSIS — Z598 Other problems related to housing and economic circumstances: Secondary | ICD-10-CM

## 2018-02-06 DIAGNOSIS — Z113 Encounter for screening for infections with a predominantly sexual mode of transmission: Secondary | ICD-10-CM | POA: Diagnosis not present

## 2018-02-06 DIAGNOSIS — R7303 Prediabetes: Secondary | ICD-10-CM

## 2018-02-06 DIAGNOSIS — Z114 Encounter for screening for human immunodeficiency virus [HIV]: Secondary | ICD-10-CM | POA: Diagnosis not present

## 2018-02-06 DIAGNOSIS — Z23 Encounter for immunization: Secondary | ICD-10-CM

## 2018-02-06 DIAGNOSIS — Z1389 Encounter for screening for other disorder: Secondary | ICD-10-CM

## 2018-02-06 DIAGNOSIS — E663 Overweight: Secondary | ICD-10-CM

## 2018-02-06 DIAGNOSIS — Z13820 Encounter for screening for osteoporosis: Secondary | ICD-10-CM | POA: Diagnosis not present

## 2018-02-06 DIAGNOSIS — Z0000 Encounter for general adult medical examination without abnormal findings: Secondary | ICD-10-CM | POA: Diagnosis not present

## 2018-02-06 DIAGNOSIS — E559 Vitamin D deficiency, unspecified: Secondary | ICD-10-CM | POA: Diagnosis not present

## 2018-02-06 DIAGNOSIS — F17200 Nicotine dependence, unspecified, uncomplicated: Secondary | ICD-10-CM | POA: Diagnosis not present

## 2018-02-06 DIAGNOSIS — N8110 Cystocele, unspecified: Secondary | ICD-10-CM | POA: Diagnosis not present

## 2018-02-06 DIAGNOSIS — M26609 Unspecified temporomandibular joint disorder, unspecified side: Secondary | ICD-10-CM

## 2018-02-06 DIAGNOSIS — Z599 Problem related to housing and economic circumstances, unspecified: Secondary | ICD-10-CM

## 2018-02-06 MED ORDER — NICOTINE 14 MG/24HR TD PT24: 14.0000 mg | MEDICATED_PATCH | Freq: Every day | TRANSDERMAL | 0 refills | Status: DC

## 2018-02-06 NOTE — Patient Instructions (Signed)

## 2018-02-06 NOTE — Progress Notes (Addendum)
Name: Janice Wilson   MRN: [id number]    DOB: 05-24-1956   Date:02/06/2018       Progress Note  Subjective  Chief Complaint  Chief Complaint  Patient presents with  . Annual Exam    HPI  Patient presents for annual CPE.  Diet: Eating herbal life, healthy foods, has lost 12lbs since last visit. Exercise: Is exercising every now and then.  SDOH: She notes some difficulty affording medications, getting transportation, and affording food to eat. Would like C3 referral today which we will provide.  USPSTF grade A and B recommendations    Office Visit from 02/06/2018 in [facility]  AUDIT-C Score  1     Depression:  Depression screen [facility] 2/9 02/06/2018 12/21/2017 12/06/2017 10/27/2017 10/12/2017  Decreased Interest '2 1 1 2 1  ' Down, Depressed, Hopeless 0 1 1 0 1  PHQ - 2 Score '2 2 2 2 2  ' Altered sleeping 0 '3 3 3 3  ' Tired, decreased energy '3 3 2 3 3  ' Change in appetite 0 '2 2 2 1  ' Feeling bad or failure about yourself  0 '1 1 1 1  ' Trouble concentrating '2 3 3 3 3  ' Moving slowly or fidgety/restless '3 3 3 3 ' 0  Suicidal thoughts 0 0 0 0 0  PHQ-9 Score '10 17 16 17 13  ' Difficult doing work/chores Very difficult Extremely dIfficult Extremely dIfficult Extremely dIfficult -  Some recent data might be hidden   Hypertension: BP Readings from Last 3 Encounters:  02/06/18 110/72  12/21/17 130/80  12/06/17 100/60   Obesity: Wt Readings from Last 3 Encounters:  02/06/18 154 lb 1.6 oz (69.9 kg)  12/21/17 162 lb 8 oz (73.7 kg)  12/06/17 161 lb (73 kg)   BMI Readings from Last 3 Encounters:  02/06/18 25.64 kg/m  12/21/17 27.04 kg/m  12/06/17 26.79 kg/m    Hep C Screening: Has had in the past - negative. STD testing and prevention (HIV/chl/gon/syphilis): We will check today. Intimate partner violence: No concerns today Sexual History/Pain during Intercourse: Has not been sexually active in over a year. Menstrual History/LMP/Abnormal Bleeding: S/P hysterectomy;  denies any vaginal bleeding. Incontinence Symptoms: Does note some stress and urge incontinence - has bladder cystocele.  Advanced Care Planning: A voluntary discussion about advance care planning including the explanation and discussion of advance directives.  Discussed health care proxy and Living will, and the patient was able to identify a health care proxy as Daughter Lyanne Co).  Patient does not have a living will at present time. If patient does have living will, I have requested they bring this to the clinic to be scanned in to their chart.  Breast cancer: No family or personal history of breast cancer; UTD on mammogram No results found for: [facility]  BRCA gene screening: Not indicated Cervical cancer screening: UTD on pap  Osteoporosis Screening: Pt would like screening this year. No results found for: HMDEXASCAN  Lipids: Stable in May 2019 Lab Results  Component Value Date   CHOL 147 10/27/2017   CHOL 238 (H) 02/14/2017   CHOL 136 08/10/2016   Lab Results  Component Value Date   HDL 44 (L) 10/27/2017   HDL 48 (L) 02/14/2017   HDL 39 (L) 08/10/2016   Lab Results  Component Value Date   LDLCALC 83 10/27/2017   LDLCALC 173 (H) 02/14/2017   LDLCALC 82 08/10/2016   Lab Results  Component Value Date   TRIG 100 10/27/2017   TRIG  87 02/14/2017   TRIG 73 08/10/2016   Lab Results  Component Value Date   CHOLHDL 3.3 10/27/2017   CHOLHDL 5.0 (H) 02/14/2017   CHOLHDL 3.5 08/10/2016   No results found for: LDLDIRECT  Glucose: Stable in May 2019 Glucose  Date Value Ref Range Status  10/08/2013 91 65 - 99 mg/dL Final   Glucose, Bld  Date Value Ref Range Status  10/27/2017 73 65 - 139 mg/dL Final    Comment:    .        Non-fasting reference interval .   10/06/2017 114 (H) 65 - 99 mg/dL Final  09/27/2016 127 (H) 65 - 99 mg/dL Final    Skin cancer: Has area to LEFT CHEST that used to be flat and is now raised and changing colors. Colorectal cancer: UTD    Lung cancer: Smoking about 7 cigarettes a day, says she is ready to quit; she has tried chantix and it did not work. ECG:  Not indicated.  Patient Active Problem List   Diagnosis Date Noted  . Chronic angle-closure glaucoma of eye, left, mild stage 08/24/2017  . 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 Dry eye syndrome of both lacrimal glands 08/24/2017  . Age-related nuclear cataract of both eyes 08/24/2017  . Murmur, cardiac 12/16/2016  . Disorder of patellofemoral joint 12/16/2016  . Hidradenitis 12/16/2016  . Speech and language deficits 12/16/2016  . Vitamin D deficiency 12/16/2016  . Hypertension 10/11/2016  . Melanosis of colon   . Hyperlipidemia 09/15/2015  . Overweight (BMI 25.0-29.9) 06/17/2015  . Apnea, sleep 04/15/2015  . Asthma, mild intermittent 04/15/2015  . Cervical disc disease 04/15/2015  . Dysfunction of eustachian tube 04/15/2015  . Compulsive tobacco user syndrome 04/15/2015  . Anxiety and depression 01/02/2015  . Chronic cervical pain 01/02/2015  . Cannot sleep 01/02/2015  . Bladder cystocele 07/31/2014  . Cervical spinal stenosis 07/05/2011    Past Surgical History:  Procedure Laterality Date  . ANTERIOR CERVICAL DECOMP/DISCECTOMY FUSION  07/05/2011   Procedure: ANTERIOR CERVICAL DECOMPRESSION/DISCECTOMY FUSION 3 LEVELS;  Surgeon: Cooper Render Pool;  Location: [place] [facility];  Service: Neurosurgery;  Laterality: N/A;  Cervical Four-Five, Cervical Five-Six, Cervical Six-Seven Anterior Cervical Decompression Fusion WITH ALLOGRAFT AND PLATING  . CESAREAN SECTION    . COLONOSCOPY WITH PROPOFOL N/A 04/16/2016   Procedure: COLONOSCOPY WITH PROPOFOL;  Surgeon: Lucilla Lame, MD;  Location: [facility];  Service: Endoscopy;  Laterality: N/A;  sleep apnea LATEX sensitivity  . ESOPHAGOGASTRODUODENOSCOPY (EGD) WITH PROPOFOL N/A 01/12/2017   Procedure: ESOPHAGOGASTRODUODENOSCOPY (EGD) WITH PROPOFOL;  Surgeon: Jonathon Bellows, MD;  Location: [facility];  Service: Endoscopy;  Laterality: N/A;  Latex sensitivity sleep apnea  . PARTIAL HYSTERECTOMY      Family History  Problem Relation Age of Onset  . Cancer Mother        colon  . Stroke Father   . Anesthesia problems Neg Hx   . Breast cancer Neg Hx     Social History   Socioeconomic History  . Marital status: Single    Spouse name: Not on file  . Number of children: 1  . Years of education: Not on file  . Highest education level: Associate degree: academic program  Occupational History  . Occupation: Disabled  Social Needs  . Financial resource strain: Very hard  . Food insecurity:    Worry: Often true    Inability: Often true  . Transportation needs:    Medical: Yes  Non-medical: Yes  Tobacco Use  . Smoking status: Current Every Day Smoker    Packs/day: 0.25    Years: 25.00    Pack years: 6.25    Types: Cigarettes  . Smokeless tobacco: Never Used  Substance and Sexual Activity  . Alcohol use: Yes    Alcohol/week: 0.0 standard drinks    Comment: 1-2 beer a month(Holidays)  . Drug use: No  . Sexual activity: Yes  Lifestyle  . Physical activity:    Days per week: 7 days    Minutes per session: 30 min  . Stress: To some extent  Relationships  . Social connections:    Talks on phone: Patient refused    Gets together: Patient refused    Attends religious service: Patient refused    Active member of club or organization: Patient refused    Attends meetings of clubs or organizations: Patient refused    Relationship status: Patient refused  . Intimate partner violence:    Fear of current or ex partner: No    Emotionally abused: No    Physically abused: No    Forced sexual activity: No  Other Topics Concern  . Not on file  Social History Narrative  . Not on file     Current Outpatient Medications:  .  acetaminophen (TYLENOL) 325 MG tablet, Take 325 mg by mouth every 6 (six) hours as needed. For pain , Disp: , Rfl:  .  albuterol (PROVENTIL  HFA;VENTOLIN HFA) 108 (90 BASE) MCG/ACT inhaler, Inhale 1 puff into the lungs every 4 (four) hours as needed. For shortness of breath, Disp: , Rfl:  .  cetirizine (ZYRTEC) 10 MG tablet, Take 1 tablet (10 mg total) by mouth daily., Disp: 30 tablet, Rfl: 0 .  conjugated estrogens (PREMARIN) vaginal cream, Place vaginally., Disp: , Rfl:  .  cyclobenzaprine (FLEXERIL) 10 MG tablet, Take 1 tablet (10 mg total) by mouth 3 (three) times daily as needed for muscle spasms., Disp: 30 tablet, Rfl: 0 .  fluticasone (FLONASE) 50 MCG/ACT nasal spray, Place 2 sprays into the nose daily as needed. FOR CONGESTION , Disp: , Rfl:  .  Fluticasone-Salmeterol (ADVAIR) 250-50 MCG/DOSE AEPB, Inhale 1 puff into the lungs every 12 (twelve) hours as needed. FOR WHEEZING, Disp: 180 each, Rfl: 1 .  gabapentin (NEURONTIN) 300 MG capsule, Take 1 capsule (300 mg total) by mouth at bedtime., Disp: 90 capsule, Rfl: 1 .  hydrOXYzine (ATARAX/VISTARIL) 10 MG tablet, TAKE 1 TABLET (10 MG TOTAL) BY MOUTH AT BEDTIME AS NEEDED., Disp: 30 tablet, Rfl: 0 .  latanoprost (XALATAN) 0.005 % ophthalmic solution, Apply 1 drop to eye at bedtime., Disp: , Rfl:  .  lisinopril (PRINIVIL,ZESTRIL) 5 MG tablet, Take 1 tablet (5 mg total) by mouth daily., Disp: 90 tablet, Rfl: 0 .  montelukast (SINGULAIR) 10 MG tablet, Take 1 tablet (10 mg total) by mouth at bedtime., Disp: 90 tablet, Rfl: 0 .  Multiple Vitamins-Minerals (MULTIPLE VITAMINS/WOMENS PO), Take 1 tablet by mouth daily., Disp: , Rfl:  .  naproxen (NAPROSYN) 500 MG tablet, Take 1 tablet (500 mg total) by mouth 2 (two) times daily with a meal., Disp: 30 tablet, Rfl: 0 .  nystatin-triamcinolone ointment (MYCOLOG), APPLY TO AFFECTED AREA TWICE A DAY, Disp: , Rfl: 0 .  rosuvastatin (CRESTOR) 20 MG tablet, Take 1 tablet (20 mg total) by mouth daily., Disp: 90 tablet, Rfl: 0 .  sertraline (ZOLOFT) 100 MG tablet, Take 2 tablets (200 mg total) by mouth daily., Disp: 60 tablet, Rfl:  0 .  timolol (BETIMOL)  0.5 % ophthalmic solution, Place 1 drop into both eyes every morning.  , Disp: , Rfl:  .  tiZANidine (ZANAFLEX) 4 MG tablet, Take 4 mg by mouth as needed., Disp: , Rfl: 1 .  triamcinolone (KENALOG) 0.025 % ointment, Apply 1 application topically 2 (two) times daily., Disp: 30 g, Rfl: 0 .  omeprazole (PRILOSEC) 40 MG capsule, Take 1 capsule (40 mg total) by mouth daily., Disp: 90 capsule, Rfl: 0  Current Facility-Administered Medications:  .  mupirocin ointment (BACTROBAN) 2 %, , Topical, TID, Roselee Nova, MD  Allergies  Allergen Reactions  . Oxycodone Hcl Other (See Comments)    REACTION:"MAKES ME PALE"  . Latex Rash    Irritation  (Condoms and gloves)     ROS  Constitutional: Negative for fever; positive for weight loss (intentional).  Respiratory: Negative for cough and shortness of breath.   Cardiovascular: Negative for chest pain or palpitations.  Gastrointestinal: Negative for abdominal pain, no bowel changes.  Musculoskeletal: Negative for gait problem or joint swelling.  Skin: Negative for rash.  Neurological: Negative for dizziness; positive for occasional headache.  No other specific complaints in a complete review of systems (except as listed in HPI above).  Objective  Vitals:   02/06/18 0912  BP: 110/72  Pulse: 77  Resp: 16  Temp: 99.1 F (37.3 C)  TempSrc: Oral  SpO2: 95%  Weight: 154 lb 1.6 oz (69.9 kg)  Height: '5\' 5"'  (1.651 m)    Body mass index is 25.64 kg/m.  Physical Exam  Constitutional: Patient appears well-developed and well-nourished. No distress.  HENT: Head: Normocephalic and atraumatic. Ears: B TMs ok, no erythema or effusion; Nose: Nose normal. Mouth/Throat: Oropharynx is clear and moist. No oropharyngeal exudate.  Tenderness at TMJ - she notes a history of TMJ and she has trouble opening her mouth completely. Eyes: Conjunctivae and EOM are normal. Pupils are equal, round, and reactive to light. No scleral icterus.  Neck: Normal range  of motion. Neck supple. No JVD present. No thyromegaly present.  Cardiovascular: Normal rate, regular rhythm and normal heart sounds.  No murmur heard. No BLE edema. Pulmonary/Chest: Effort normal and breath sounds normal. No respiratory distress. Abdominal: Soft. Bowel sounds are normal, no distension. There is no tenderness. no masses Breast: no lumps or masses, no nipple discharge or rashes FEMALE GENITALIA: Deferred Musculoskeletal: Normal range of motion, no joint effusions. No gross deformities Neurological: he is alert and oriented to person, place, and time. No cranial nerve deficit. Coordination, balance, strength, speech and gait are normal.  Skin: Skin is warm and dry. No rash noted. No erythema. Pencil eraser sized nevus that is raised, various shades of hyperpigmentation, round with slightly irregular border to LEFT upper chest; RIGHT mid-back exhibits irregularly shaped hyperpigmented lesion.  Back has what appears to be large number of actinic keratosis lesions. Psychiatric: Patient has a normal mood and affect. behavior is normal. Judgment and thought content normal.  No results found for this or any previous visit (from the past 2160 hour(s)).  PHQ2/9: Depression screen [facility] 2/9 02/06/2018 12/21/2017 12/06/2017 10/27/2017 10/12/2017  Decreased Interest '2 1 1 2 1  ' Down, Depressed, Hopeless 0 1 1 0 1  PHQ - 2 Score '2 2 2 2 2  ' Altered sleeping 0 '3 3 3 3  ' Tired, decreased energy '3 3 2 3 3  ' Change in appetite 0 '2 2 2 1  ' Feeling bad or failure about yourself  0  '1 1 1 1  ' Trouble concentrating '2 3 3 3 3  ' Moving slowly or fidgety/restless '3 3 3 3 ' 0  Suicidal thoughts 0 0 0 0 0  PHQ-9 Score '10 17 16 17 13  ' Difficult doing work/chores Very difficult Extremely dIfficult Extremely dIfficult Extremely dIfficult -  Some recent data might be hidden   Fall Risk: Fall Risk  02/06/2018 12/21/2017 12/21/2017 12/06/2017 10/27/2017  Falls in the past year? No No No No No  Risk for fall due to : - - -  Impaired vision;Impaired balance/gait;Medication side effect -  Risk for fall due to: Comment - - - wears eyeglasses, glaucoma; unsteady gait -   Assessment & Plan  1. Well woman exam (no gynecological exam) -USPSTF grade A and B recommendations reviewed with patient; age-appropriate recommendations, preventive care, screening tests, etc discussed and encouraged; healthy living encouraged; see AVS for patient education given to patient -Discussed importance of 150 minutes of physical activity weekly, eat two servings of fish weekly, eat one serving of tree nuts ( cashews, pistachios, pecans, almonds.Marland Kitchen) every other day, eat 6 servings of fruit/vegetables daily and drink plenty of water and avoid sweet beverages.   2. Needs flu shot - Per orders.  3. Routine screening for STI (sexually transmitted infection) - HIV antibody - RPR - Cervicovaginal ancillary only  4. Screening for HIV without presence of risk factors - HIV antibody  5. Compulsive tobacco user syndrome - nicotine (NICODERM CQ - DOSED IN MG/24 HOURS) 14 mg/24hr patch; Place 1 patch (14 mg total) onto the skin daily.  Dispense: 28 patch; Refill: 0 - Cessation discussed, ready to quit.   6. Financial difficulties - Ambulatory referral to [facility]  7. Female cystocele - Discussed option to see surgeon vs Pelvic floor PT, will start with at home Kegel Exercises.   8. Vitamin D deficiency - HM DEXA SCAN - pt requests  9. Osteoporosis screening - HM DEXA SCAN  10. Overweight (BMI 25.0-29.9) - Hemoglobin A1c  11. Prediabetes - Hemoglobin A1c  12. Encounter for surveillance of abnormal nevi - Ambulatory referral to Dermatology  13. TMJ (temporomandibular joint disorder) - Ambulatory referral to Oral Maxillofacial Surgery

## 2018-02-07 ENCOUNTER — Ambulatory Visit: Payer: PPO

## 2018-02-07 DIAGNOSIS — M26609 Unspecified temporomandibular joint disorder, unspecified side: Secondary | ICD-10-CM | POA: Insufficient documentation

## 2018-02-07 LAB — HEMOGLOBIN A1C
Hgb A1c MFr Bld: 6 % of total Hgb — ABNORMAL HIGH (ref <5.7)
Mean Plasma Glucose: 126 (calc)
eAG (mmol/L): 7 (calc)

## 2018-02-07 LAB — HIV ANTIBODY (ROUTINE TESTING W REFLEX): HIV 1&2 Ab, 4th Generation: NONREACTIVE

## 2018-02-07 LAB — RPR: RPR Ser Ql: NONREACTIVE

## 2018-02-07 NOTE — Addendum Note (Signed)
Addended by: Hubbard Hartshorn on: 02/07/2018 11:07 AM   Modules accepted: Orders

## 2018-02-08 ENCOUNTER — Ambulatory Visit: Payer: PPO | Admitting: Psychiatry

## 2018-02-10 LAB — CERVICOVAGINAL ANCILLARY ONLY
Chlamydia: NEGATIVE
Neisseria Gonorrhea: NEGATIVE

## 2018-02-15 DIAGNOSIS — H9209 Otalgia, unspecified ear: Secondary | ICD-10-CM | POA: Diagnosis not present

## 2018-02-19 ENCOUNTER — Other Ambulatory Visit: Payer: Self-pay | Admitting: Family Medicine

## 2018-02-19 DIAGNOSIS — F419 Anxiety disorder, unspecified: Secondary | ICD-10-CM

## 2018-02-19 DIAGNOSIS — F329 Major depressive disorder, single episode, unspecified: Secondary | ICD-10-CM

## 2018-02-19 DIAGNOSIS — F32A Depression, unspecified: Secondary | ICD-10-CM

## 2018-02-19 NOTE — Telephone Encounter (Signed)
She needs follow up, sending 30 days, but is due for depression follow up

## 2018-02-21 ENCOUNTER — Other Ambulatory Visit: Payer: Self-pay | Admitting: Family Medicine

## 2018-02-21 DIAGNOSIS — F329 Major depressive disorder, single episode, unspecified: Secondary | ICD-10-CM

## 2018-02-21 DIAGNOSIS — F419 Anxiety disorder, unspecified: Secondary | ICD-10-CM

## 2018-02-21 DIAGNOSIS — F32A Depression, unspecified: Secondary | ICD-10-CM

## 2018-02-21 NOTE — Telephone Encounter (Signed)
Refill request for general medication. Zoloft to [facility]  Last office visit 02/06/2018   Follow up 12/19/2018

## 2018-02-23 ENCOUNTER — Ambulatory Visit: Payer: PPO

## 2018-02-27 ENCOUNTER — Ambulatory Visit: Payer: No Typology Code available for payment source

## 2018-03-01 ENCOUNTER — Ambulatory Visit: Payer: No Typology Code available for payment source

## 2018-03-02 ENCOUNTER — Ambulatory Visit: Payer: PPO | Admitting: Psychiatry

## 2018-03-13 DIAGNOSIS — Z1283 Encounter for screening for malignant neoplasm of skin: Secondary | ICD-10-CM | POA: Diagnosis not present

## 2018-03-13 DIAGNOSIS — D2272 Melanocytic nevi of left lower limb, including hip: Secondary | ICD-10-CM | POA: Diagnosis not present

## 2018-03-13 DIAGNOSIS — D229 Melanocytic nevi, unspecified: Secondary | ICD-10-CM | POA: Diagnosis not present

## 2018-03-13 DIAGNOSIS — L0291 Cutaneous abscess, unspecified: Secondary | ICD-10-CM | POA: Diagnosis not present

## 2018-03-13 DIAGNOSIS — L821 Other seborrheic keratosis: Secondary | ICD-10-CM | POA: Diagnosis not present

## 2018-03-13 DIAGNOSIS — L732 Hidradenitis suppurativa: Secondary | ICD-10-CM | POA: Diagnosis not present

## 2018-03-15 ENCOUNTER — Ambulatory Visit: Payer: Self-pay | Admitting: Family Medicine

## 2018-03-15 DIAGNOSIS — H2513 Age-related nuclear cataract, bilateral: Secondary | ICD-10-CM | POA: Diagnosis not present

## 2018-03-15 DIAGNOSIS — H402221 Chronic angle-closure glaucoma, left eye, mild stage: Secondary | ICD-10-CM | POA: Diagnosis not present

## 2018-03-15 DIAGNOSIS — H40031 Anatomical narrow angle, right eye: Secondary | ICD-10-CM | POA: Diagnosis not present

## 2018-03-15 DIAGNOSIS — H04123 Dry eye syndrome of bilateral lacrimal glands: Secondary | ICD-10-CM | POA: Diagnosis not present

## 2018-03-16 ENCOUNTER — Ambulatory Visit (INDEPENDENT_AMBULATORY_CARE_PROVIDER_SITE_OTHER): Payer: PPO | Admitting: Family Medicine

## 2018-03-16 ENCOUNTER — Encounter: Payer: Self-pay | Admitting: Family Medicine

## 2018-03-16 DIAGNOSIS — F0781 Postconcussional syndrome: Secondary | ICD-10-CM | POA: Diagnosis not present

## 2018-03-16 NOTE — Patient Instructions (Signed)
Post-Concussion Syndrome Post-concussion syndrome describes the symptoms that can occur after a head injury. These symptoms can last from weeks to months. What are the causes? It is not clear why some head injuries cause post-concussion syndrome. It can occur whether your head injury was mild or severe and whether you were wearing head protection or not. What are the signs or symptoms?  Memory difficulties.  Dizziness.  Headaches.  Double vision or blurry vision.  Sensitivity to light.  Hearing difficulties.  Depression.  Tiredness.  Weakness.  Difficulty with concentration.  Difficulty sleeping or staying asleep.  Vomiting.  Poor balance or instability on your feet.  Slow reaction time.  Difficulty learning and remembering things you have heard. How is this diagnosed? There is no test to determine whether you have post-concussion syndrome. Your health care provider may order an imaging scan of your brain, such as a CT scan, to check for other problems that may be causing your symptoms (such as a severe injury inside your skull). How is this treated? Usually, these problems disappear over time without medical care. Your health care provider may prescribe medicine to help ease your symptoms. It is important to follow up with a neurologist to evaluate your recovery and address any lingering symptoms or issues. Follow these instructions at home:  Take medicines only as directed by your health care provider. Do not take aspirin. Aspirin can slow blood clotting.  Sleep with your head slightly elevated to help with headaches.  Avoid any situation where there is potential for another head injury. This includes football, hockey, soccer, basketball, martial arts, downhill snow sports, and horseback riding. Your condition will get worse every time you experience a concussion. You should avoid these activities until you are evaluated by the appropriate follow-up health care  providers.  Keep all follow-up visits as directed by your health care provider. This is important. Contact a health care provider if:  You have increased problems paying attention or concentrating.  You have increased difficulty remembering or learning new information.  You need more time to complete tasks or assignments than before.  You have increased irritability or decreased ability to cope with stress.  You have more symptoms than before. Seek medical care if you have any of the following symptoms for more than two weeks after your injury:  Lasting (chronic) headaches.  Dizziness or balance problems.  Nausea.  Vision problems.  Increased sensitivity to noise or light.  Depression or mood swings.  Anxiety or irritability.  Memory problems.  Difficulty concentrating or paying attention.  Sleep problems.  Feeling tired all the time.  Get help right away if:  You have confusion or unusual drowsiness.  Others find it difficult to wake you up.  You have nausea or persistent, forceful vomiting.  You feel like you are moving when you are not (vertigo). Your eyes may move rapidly back and forth.  You have convulsions or faint.  You have severe, persistent headaches that are not relieved by medicine.  You cannot use your arms or legs normally.  One of your pupils is larger than the other.  You have clear or bloody discharge from your nose or ears.  Your problems are getting worse, not better. This information is not intended to replace advice given to you by your health care provider. Make sure you discuss any questions you have with your health care provider. Document Released: 11/27/2001 Document Revised: 12/26/2015 Document Reviewed: 09/12/2013 Elsevier Interactive Patient Education  2018 [employer]  
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
## 2018-03-17 ENCOUNTER — Telehealth: Payer: Self-pay | Admitting: Family Medicine

## 2018-03-17 DIAGNOSIS — R6884 Jaw pain: Secondary | ICD-10-CM

## 2018-03-17 NOTE — Telephone Encounter (Signed)
Copied from [place] [id number]. Topic: Referral - Request >> Mar 17, 2018 10:45 AM Janice Wilson wrote: Reason for CRM: pt called to get a referral to an oral surgeon so that her jaw can be fixed; pt is currently holding her jaw in place; contact pt to advise

## 2018-03-19 NOTE — Telephone Encounter (Signed)
She did not say anything about jaw during her visit. She may have forgotten. I can order mandible x-ray to start

## 2018-03-20 NOTE — Telephone Encounter (Signed)
Order has been placed and patient has been informed that she can go to the [facility] to have imaging performed today then we will give her a call after receiving the results to say how Dr. Ancil Boozer plans to proceed.

## 2018-03-30 ENCOUNTER — Other Ambulatory Visit: Payer: Self-pay | Admitting: Family Medicine

## 2018-03-30 DIAGNOSIS — I10 Essential (primary) hypertension: Secondary | ICD-10-CM

## 2018-04-07 ENCOUNTER — Ambulatory Visit: Payer: Self-pay

## 2018-04-07 NOTE — Telephone Encounter (Signed)
Pt. Feels that she is having a reaction to her Zoloft. Having blurred vision in her peripheral, muscle cramping. And "I feel like I'm starting to stutter." Spoke with [facility] in practice and pt. Advised to go to ED noe for evaluation. Pt. States that Dr. Ancil Boozer "should work me in today." Explained that with her vision and speech issues, she should be evaluated in ED. Verbalized understanding.  Reason for Disposition . [1] Blurred vision or visual changes AND [2] present now AND [3] sudden onset or new (e.g., minutes, hours, days)  (Exception: seeing floaters / black specks OR previously diagnosed migraine headaches with same symptoms)  Answer Assessment - Initial Assessment Questions 1. DESCRIPTION: "What is the vision loss like? Describe it for me." (e.g., complete vision loss, blurred vision, double vision, floaters, etc.)     Blurred vision in peripheral 2. LOCATION: "One or both eyes?" If one, ask: "Which eye?"     Both eyes 3. SEVERITY: "Can you see anything?" If so, ask: "What can you see?" (e.g., fine print)     Yes can see 4. ONSET: "When did this begin?" "Did it start suddenly or has this been gradual?"     Started 12 days ago 5. PATTERN: "Does this come and go, or has it been constant since it started?"     Comes and goes 6. PAIN: "Is there any pain in your eye(s)?"  (Scale 1-10; or mild, moderate, severe)     No 7. CONTACTS-GLASSES: "Do you wear contacts or glasses?"     Glasses 8. CAUSE: "What do you think is causing this visual problem?"     The Zoloft 9. OTHER SYMPTOMS: "Do you have any other symptoms?" (e.g., confusion, headache, arm or leg weakness, speech problems)     Muscle spasms 10. PREGNANCY: "Is there any chance you are pregnant?" "When was your last menstrual period?"       No  Protocols used: [place]

## 2018-04-07 NOTE — Telephone Encounter (Signed)
Please review

## 2018-04-10 ENCOUNTER — Ambulatory Visit (INDEPENDENT_AMBULATORY_CARE_PROVIDER_SITE_OTHER): Payer: PPO | Admitting: Psychiatry

## 2018-04-10 ENCOUNTER — Emergency Department
Admission: EM | Admit: 2018-04-10 | Discharge: 2018-04-10 | Disposition: A | Discharge status: Home / Self Care | Payer: PPO | Attending: Emergency Medicine | Admitting: Emergency Medicine

## 2018-04-10 ENCOUNTER — Other Ambulatory Visit: Payer: Self-pay

## 2018-04-10 ENCOUNTER — Encounter: Payer: Self-pay | Admitting: Emergency Medicine

## 2018-04-10 ENCOUNTER — Encounter: Payer: Self-pay | Admitting: Psychiatry

## 2018-04-10 VITALS — BP 170/92 | HR 71 | Temp 98.4°F | Wt 167.4 lb

## 2018-04-10 DIAGNOSIS — Z5321 Procedure and treatment not carried out due to patient leaving prior to being seen by health care provider: Secondary | ICD-10-CM | POA: Insufficient documentation

## 2018-04-10 DIAGNOSIS — R0602 Shortness of breath: Secondary | ICD-10-CM | POA: Diagnosis not present

## 2018-04-10 DIAGNOSIS — F4310 Post-traumatic stress disorder, unspecified: Secondary | ICD-10-CM | POA: Diagnosis not present

## 2018-04-10 DIAGNOSIS — F17200 Nicotine dependence, unspecified, uncomplicated: Secondary | ICD-10-CM | POA: Diagnosis not present

## 2018-04-10 DIAGNOSIS — G4701 Insomnia due to medical condition: Secondary | ICD-10-CM

## 2018-04-10 DIAGNOSIS — F1220 Cannabis dependence, uncomplicated: Secondary | ICD-10-CM | POA: Diagnosis not present

## 2018-04-10 DIAGNOSIS — F331 Major depressive disorder, recurrent, moderate: Secondary | ICD-10-CM | POA: Diagnosis not present

## 2018-04-10 DIAGNOSIS — R030 Elevated blood-pressure reading, without diagnosis of hypertension: Secondary | ICD-10-CM | POA: Insufficient documentation

## 2018-04-10 DIAGNOSIS — R079 Chest pain, unspecified: Secondary | ICD-10-CM | POA: Diagnosis not present

## 2018-04-10 LAB — BASIC METABOLIC PANEL
Anion gap: 7 (ref 5–15)
BUN: 17 mg/dL (ref 6–20)
CO2: 25 mmol/L (ref 22–32)
Calcium: 8.9 mg/dL (ref 8.9–10.3)
Chloride: 107 mmol/L (ref 98–111)
Creatinine, Ser: 0.74 mg/dL (ref 0.44–1.00)
GFR calc Af Amer: >60 mL/min (ref >60)
GFR calc non Af Amer: >60 mL/min (ref >60)
Glucose, Bld: 88 mg/dL (ref 70–99)
Potassium: 4.2 mmol/L (ref 3.5–5.1)
Sodium: 139 mmol/L (ref 135–145)

## 2018-04-10 LAB — CBC
HCT: 40.9 % (ref 36.0–46.0)
Hemoglobin: 13.8 g/dL (ref 12.0–15.0)
MCH: 30.4 pg (ref 26.0–34.0)
MCHC: 33.7 g/dL (ref 30.0–36.0)
MCV: 90.1 fL (ref 80.0–100.0)
Platelets: 196 10*3/uL (ref 150–400)
RBC: 4.54 MIL/uL (ref 3.87–5.11)
RDW: 14.9 % (ref 11.5–15.5)
WBC: 10.2 10*3/uL (ref 4.0–10.5)
nRBC: 0 % (ref 0.0–0.2)

## 2018-04-10 LAB — TROPONIN I: Troponin I: <0.03 ng/mL (ref <0.03)

## 2018-04-10 MED ORDER — HYDROXYZINE PAMOATE 25 MG PO CAPS: 25.0000 mg | ORAL_CAPSULE | ORAL | 1 refills | Status: DC

## 2018-04-10 MED ORDER — DIVALPROEX SODIUM ER 500 MG PO TB24: 500.0000 mg | ORAL_TABLET | Freq: Every day | ORAL | 1 refills | Status: DC

## 2018-04-10 MED ORDER — SERTRALINE HCL 100 MG PO TABS: 100.0000 mg | ORAL_TABLET | Freq: Every day | ORAL | 1 refills | Status: DC

## 2018-04-10 NOTE — Patient Instructions (Addendum)
Valproic Acid, Divalproex Sodium delayed or extended-release tablets What is this medicine? DIVALPROEX SODIUM (dye VAL pro ex SO dee um) is used to prevent seizures caused by some forms of epilepsy. It is also used to treat bipolar mania and to prevent migraine headaches. This medicine may be used for other purposes; ask your health care provider or pharmacist if you have questions. COMMON BRAND NAME(S): Depakote, Depakote ER What should I tell my health care provider before I take this medicine? They need to know if you have any of these conditions: -if you often drink alcohol -kidney disease -liver disease -low platelet counts -mitochondrial disease -suicidal thoughts, plans, or attempt; a previous suicide attempt by you or a family member -urea cycle disorder (UCD) -an unusual or allergic reaction to divalproex sodium, sodium valproate, valproic acid, other medicines, foods, dyes, or preservatives -pregnant or trying to get pregnant -breast-feeding How should I use this medicine? Take this medicine by mouth with a drink of water. Follow the directions on the prescription label. Do not cut, crush or chew this medicine. You can take it with or without food. If it upsets your stomach, take it with food. Take your medicine at regular intervals. Do not take it more often than directed. Do not stop taking except on your doctor's advice. A special MedGuide will be given to you by the pharmacist with each prescription and refill. Be sure to read this information carefully each time. Talk to your pediatrician regarding the use of this medicine in children. While this drug may be prescribed for children as young as 10 years for selected conditions, precautions do apply. Overdosage: If you think you have taken too much of this medicine contact a poison control center or emergency room at once. NOTE: This medicine is only for you. Do not share this medicine with others. What if I miss a dose? If you  miss a dose, take it as soon as you can. If it is almost time for your next dose, take only that dose. Do not take double or extra doses. What may interact with this medicine? Do not take this medicine with any of the following medications: -sodium phenylbutyrate This medicine may also interact with the following medications: -aspirin -certain antibiotics like ertapenem, imipenem, meropenem -certain medicines for depression, anxiety, or psychotic disturbances -certain medicines for seizures like carbamazepine, clonazepam, diazepam, ethosuximide, felbamate, lamotrigine, phenobarbital, phenytoin, primidone, rufinamide, topiramate -certain medicines that treat or prevent blood clots like warfarin -cholestyramine -female hormones, like estrogens and birth control pills, patches, or rings -propofol -rifampin -ritonavir -tolbutamide -zidovudine This list may not describe all possible interactions. Give your health care provider a list of all the medicines, herbs, non-prescription drugs, or dietary supplements you use. Also tell them if you smoke, drink alcohol, or use illegal drugs. Some items may interact with your medicine. What should I watch for while using this medicine? Tell your doctor or healthcare professional if your symptoms do not get better or they start to get worse. Wear a medical ID bracelet or chain, and carry a card that describes your disease and details of your medicine and dosage times. You may get drowsy, dizzy, or have blurred vision. Do not drive, use machinery, or do anything that needs mental alertness until you know how this medicine affects you. To reduce dizzy or fainting spells, do not sit or stand up quickly, especially if you are an older patient. Alcohol can increase drowsiness and dizziness. Avoid alcoholic drinks. This medicine can make you  more sensitive to the sun. Keep out of the sun. If you cannot avoid being in the sun, wear protective clothing and use  sunscreen. Do not use sun lamps or tanning beds/booths. Patients and their families should watch out for new or worsening depression or thoughts of suicide. Also watch out for sudden changes in feelings such as feeling anxious, agitated, panicky, irritable, hostile, aggressive, impulsive, severely restless, overly excited and hyperactive, or not being able to sleep. If this happens, especially at the beginning of treatment or after a change in dose, call your health care professional. Women should inform their doctor if they wish to become pregnant or think they might be pregnant. There is a potential for serious side effects to an unborn child. Talk to your health care professional or pharmacist for more information. Women who become pregnant while using this medicine may enroll in the [place] Pregnancy Registry by calling [phone]. This registry collects information about the safety of antiepileptic drug use during pregnancy. What side effects may I notice from receiving this medicine? Side effects that you should report to your doctor or health care professional as soon as possible: -allergic reactions like skin rash, itching or hives, swelling of the face, lips, or tongue -changes in vision -redness, blistering, peeling or loosening of the skin, including inside the mouth -signs and symptoms of liver injury like dark yellow or brown urine; general ill feeling or flu-like symptoms; light-colored stools; loss of appetite; nausea; right upper belly pain; unusually weak or tired; yellowing of the eyes or skin -suicidal thoughts or other mood changes -unusual bleeding or bruising Side effects that usually do not require medical attention (report to your doctor or health care professional if they continue or are bothersome): -constipation -diarrhea -dizziness -hair loss -headache -loss of appetite -weight gain This list may not describe all possible side effects. Call  your doctor for medical advice about side effects. You may report side effects to FDA at 1-800-FDA-1088. Where should I keep my medicine? Keep out of reach of children. Store at room temperature between 15 and 30 degrees C (59 and 86 degrees F). Keep container tightly closed. Throw away any unused medicine after the expiration date. NOTE: This sheet is a summary. It may not cover all possible information. If you have questions about this medicine, talk to your doctor, pharmacist, or health care provider.  2018 Elsevier/Gold Standard (2015-09-11 07:11:40) Cannabis Use Disorder Cannabis use disorder is when using marijuana disrupts a person's daily life or causes health problems. This condition can be dangerous. The health problems this condition can cause include:  Long-lasting problems with thinking and learning. These can be permanent in young people.  Severe anxiety.  Paranoia.  Hallucinations.  Dangerously high blood pressure and heart rate.  Schizophrenia.  Breathing problems.  Problems with child development during and after pregnancy.  People with this condition are also more likely to use other drugs. What are the causes? This condition is caused by using marijuana too much over time. It is not caused by using it only once in a while. Many people with this condition use marijuana because it gives them a feeling of extreme pleasure or relaxation. What increases the risk? This condition is more likely to develop in:  Men.  People with a family history of cannabis use disorder.  People with mental health issues such as depression or post-traumatic stress disorder.  What are the signs or symptoms? Symptoms of this condition include:  Using greater  amounts of marijuana than you want to, or using marijuana for longer than you want to.  Craving marijuana.  Spending a lot of time getting marijuana and using it or recovering from its effects.  Having problems at work, at  school, at home, or in relationships because of marijuana use.  Giving up or cutting down on important life activities because of marijuana use.  Using marijuana at times when it is dangerous, such as while you are driving a car.  Needing more and more marijuana to get the same effect you want from the marijuana (building up a tolerance).  Physical problems, such as: ? A long-lasting cough. ? Bronchitis. ? Emphysema. ? Throat and lung cancer.  Mental problems, such as: ? Psychosis. ? Anxiety. ? Trouble sleeping.  Having symptoms of withdrawal when you stop using marijuana. Symptom of withdrawal include: ? Irritability or anger. ? Anxiety or restlessness. ? Trouble sleeping. ? Loss of appetite or weight loss. ? Aches and pains. ? Shakiness, sweating, or chills.  How is this diagnosed? This condition is diagnosed with an assessment. During the assessment, your health care provider will ask about your marijuana use and about how it affects your life. You will be diagnosed with the condition if you have had at least two symptoms of this condition within a 37-month period. How severe the condition is depends on how many symptoms you have.  If you have two to three symptoms, your condition is mild.  If you have four to five symptoms, your condition is moderate.  If you have six or more symptoms, your condition is severe.  Your health care provider may perform a physical exam or do lab tests to see if you have physical problems resulting from marijuana use. Your health care provider may also screen for drug use and refer you to a mental health professional for evaluation. How is this treated? Treatment for this condition is usually provided by mental health professionals with training in substance use disorders. Your treatment may involve:  Counseling. This treatment is also called talk therapy. It is provided by substance use treatment counselors. A counselor can address the reasons  you use marijuana and suggest ways to keep you from using it again. The goals of talk therapy are to: ? Find healthy activities to replace using marijuana. ? Identify and avoid the things that trigger your marijuana use. ? Help you learn how to handle cravings.  Support groups. Support groups are led by people who have quit using marijuana. They provide emotional support, advice, and guidance.  Medicine. Medicine is used to treat mental health issues that trigger marijuana use or that result from it.  Follow these instructions at home:  Take over-the-counter and prescription medicines only as told by your health care provider.  Check with your health care provider before starting any new medicines.  Keep all follow-up visits as told by your health care provider. This is important.  Work with Higher education careers adviser or group to develop tools to keep you from using marijuana again (relapsing).  Make healthy lifestyle choices, such as: ? Eating a healthy diet. ? Getting enough exercise. ? Improving your stress-management skills.  Learn daily living skills and work Psychologist, occupational. Where to find more information:  [employer] on Drug Abuse: motorcyclefax.com  Substance Abuse and Mental Health Services Administration: ktimeonline.com Contact a health care provider if:  You are not able to take your medicines as told.  Your symptoms get worse. Get help right away if:  You have serious thoughts about hurting yourself or others. If you ever feel like you may hurt yourself or others, or have thoughts about taking your own life, get help right away. You can go to your nearest emergency department or call:  Your local emergency services (911 in the [place]  A suicide crisis helpline, such as the [facility] at 431-072-7261. This is open 24 hours a day.  This information is not intended to replace advice given to you by your health care provider. Make sure you discuss  any questions you have with your health care provider. Document Released: 06/04/2000 Document Revised: 03/05/2016 Document Reviewed: 03/05/2016 Elsevier Interactive Patient Education  2018 [employer]. Serotonin Syndrome Serotonin is a brain chemical that regulates the nervous system, which includes the brain, spinal cord, and nerves. Serotonin appears to play a role in all types of behavior, including appetite, emotions, movement, thinking, and response to stress. Excessively high levels of serotonin in the body can cause serotonin syndrome, which is a very dangerous condition. What are the causes? This condition can be caused by taking medicines or drugs that increase the level of serotonin in your body. These include:  Antidepressant medicines.  Migraine medicines.  Certain pain medicines.  Certain recreational drugs, including ecstasy, LSD, cocaine, and amphetamines.  Over-the-counter cough or cold medicines that contain dextromethorphan.  Certain herbal supplements, including St. John's wort, ginseng, and nutmeg.  This condition usually occurs when you take these medicines or drugs in combination, but it can also happen with a high dose of a single medicine or drug. What increases the risk? This condition is more likely to develop in:  People who have recently increased the dosage of medicine that increases the serotonin level.  People who just started taking medicine that increases the serotonin level.  What are the signs or symptoms? Symptoms of this condition usually happens within several hours of a medicine change. Symptoms include:  Headache.  Muscle twitching or stiffness.  Diarrhea.  Confusion.  Restlessness or agitation.  Shivering or goose bumps.  Loss of muscle coordination.  Rapid heart rate.  Sweating.  Severe cases of serotonin syndromecan cause:  Irregular heartbeat.  Seizures.  Loss of consciousness.  High fever.  How is this  diagnosed? This condition is diagnosed with a medical history and physical exam. You will be asked aboutyour symptoms and your use of medicines and recreational drugs. Your health care provider may also order lab work or additional tests to rule out other causes of your symptoms. How is this treated? The treatment for this condition depends on the severity of your symptoms. For mild cases, stopping the medicine that caused your condition is usually all that is needed. For moderate to severe cases, hospitalization is required to monitor you and to prevent further muscle damage. Follow these instructions at home:  Take over-the-counter and prescription medicines only as told by your health care provider. This is important.  Check with your health care provider before you start taking any new prescriptions, over-the-counter medicines, herbs, or supplements.  Avoid combining any medicines that can cause this condition to occur.  Keep all follow-up visits as told by your health care provider.This is important.  Maintain a healthy lifestyle. ? Eat healthy foods. ? Get plenty of sleep. ? Exercise regularly. ? Do not drink alcohol. ? Do not use recreational drugs. Contact a health care provider if:  Medicines do not seem to be helping.  Your symptoms do not improve or they  get worse.  You have trouble taking care of yourself. Get help right away if:  You have worsening confusion, severe headache, chest pain, high fever, seizures, or loss of consciousness.  You have serious thoughts about hurting yourself or others.  You experience serious side effects of medicine, such as swelling of your face, lips, tongue, or throat. This information is not intended to replace advice given to you by your health care provider. Make sure you discuss any questions you have with your health care provider. Document Released: 07/15/2004 Document Revised: 01/31/2016 Document Reviewed: 06/20/2014 Elsevier  Interactive Patient Education  [employer].

## 2018-04-10 NOTE — ED Triage Notes (Signed)
Pt arrived via POV with reports of elevated blood pressure, PCP sent pt to be evaluated.  Pt c/o some chest pain and shortness of breath.  Pt states she smokes cannabis also to help her calm her down.

## 2018-04-10 NOTE — ED Notes (Addendum)
Pt states she does not want to be seen now, states her Dr. Rockey Situ her to come over here if she wanted to.  Pt reports she does not want to be seen now after triage completed.
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
## 2018-04-13 NOTE — Telephone Encounter (Signed)
Copied from [place] [id number]. Topic: General - Other >> Apr 12, 2018 11:19 AM Janace Aris A wrote: Reason for CRM: Pt is requesting a phone call from Dr. Ancil Boozer Nurse. She says nobody followed up with her from her triage call she had on 10/18. She says she is still having discomfort.  I was able to schedule pt for a hospital f/u with provider.

## 2018-04-14 ENCOUNTER — Inpatient Hospital Stay: Payer: Self-pay | Admitting: Family Medicine

## 2018-04-14 ENCOUNTER — Encounter: Payer: Self-pay | Admitting: Family Medicine

## 2018-04-19 ENCOUNTER — Other Ambulatory Visit: Payer: Self-pay | Admitting: *Deleted

## 2018-04-19 NOTE — Patient Outreach (Signed)
[facility]) Care Management  04/19/2018  VIVIANE SEMIDEY 14-Apr-1956 [id number]    TELEPHONE SCREENING Referral date: 04/18/18 Referral source: [employer] Referral reason: medication assistance, transportation, food assistance Insurance: [employer]  Telephone call to patient regarding Health Team Referral. HIPAA verified.  Patient driving at this time. Patient requests that we call back at another time.    PLAN: RN CM will attempt second phone call to patient within four business days. RN CM will send outreach letter to patient.  Lanae Boast RN BSN [facility], molecular Dial:  [phone] Fax: [phone] [address],   [place],  [place]  [address]  Website:  [email or web address]

## 2018-04-20 ENCOUNTER — Other Ambulatory Visit: Payer: Self-pay | Admitting: *Deleted

## 2018-04-20 ENCOUNTER — Ambulatory Visit: Payer: Self-pay

## 2018-04-20 ENCOUNTER — Ambulatory Visit: Payer: PPO | Admitting: Family Medicine

## 2018-04-20 DIAGNOSIS — L732 Hidradenitis suppurativa: Secondary | ICD-10-CM | POA: Diagnosis not present
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
## 2018-04-23 ENCOUNTER — Other Ambulatory Visit: Payer: Self-pay | Admitting: Family Medicine

## 2018-04-23 DIAGNOSIS — F32A Depression, unspecified: Secondary | ICD-10-CM

## 2018-04-23 DIAGNOSIS — F419 Anxiety disorder, unspecified: Secondary | ICD-10-CM

## 2018-04-23 DIAGNOSIS — F329 Major depressive disorder, single episode, unspecified: Secondary | ICD-10-CM

## 2018-04-24 ENCOUNTER — Encounter: Payer: Self-pay | Admitting: Family Medicine

## 2018-04-24 ENCOUNTER — Ambulatory Visit (INDEPENDENT_AMBULATORY_CARE_PROVIDER_SITE_OTHER): Payer: PPO | Admitting: Family Medicine

## 2018-04-24 ENCOUNTER — Other Ambulatory Visit: Payer: Self-pay | Admitting: Pharmacist

## 2018-04-24 VITALS — BP 130/74 | HR 76 | Temp 98.7°F | Resp 16 | Ht 65.0 in | Wt 169.0 lb

## 2018-04-24 DIAGNOSIS — F331 Major depressive disorder, recurrent, moderate: Secondary | ICD-10-CM | POA: Diagnosis not present

## 2018-04-24 DIAGNOSIS — I10 Essential (primary) hypertension: Secondary | ICD-10-CM

## 2018-04-24 DIAGNOSIS — G8929 Other chronic pain: Secondary | ICD-10-CM

## 2018-04-24 DIAGNOSIS — K219 Gastro-esophageal reflux disease without esophagitis: Secondary | ICD-10-CM

## 2018-04-24 MED ORDER — OMEPRAZOLE 40 MG PO CPDR: 40.0000 mg | DELAYED_RELEASE_CAPSULE | Freq: Every day | ORAL | 0 refills | Status: DC
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
## 2018-04-26 ENCOUNTER — Ambulatory Visit (INDEPENDENT_AMBULATORY_CARE_PROVIDER_SITE_OTHER): Payer: PPO | Admitting: Psychiatry

## 2018-04-26 ENCOUNTER — Ambulatory Visit: Payer: PPO | Admitting: Licensed Clinical Social Worker

## 2018-04-26 ENCOUNTER — Other Ambulatory Visit: Payer: Self-pay

## 2018-04-26 ENCOUNTER — Encounter: Payer: Self-pay | Admitting: Psychiatry

## 2018-04-26 VITALS — BP 175/96 | HR 82 | Temp 98.3°F | Wt 172.8 lb

## 2018-04-26 DIAGNOSIS — F4310 Post-traumatic stress disorder, unspecified: Secondary | ICD-10-CM | POA: Diagnosis not present

## 2018-04-26 DIAGNOSIS — F331 Major depressive disorder, recurrent, moderate: Secondary | ICD-10-CM

## 2018-04-26 DIAGNOSIS — F17200 Nicotine dependence, unspecified, uncomplicated: Secondary | ICD-10-CM

## 2018-04-26 DIAGNOSIS — G4701 Insomnia due to medical condition: Secondary | ICD-10-CM

## 2018-04-26 DIAGNOSIS — F1220 Cannabis dependence, uncomplicated: Secondary | ICD-10-CM | POA: Diagnosis not present
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
## 2018-04-27 ENCOUNTER — Encounter: Payer: Self-pay | Admitting: Psychiatry

## 2018-04-27 ENCOUNTER — Other Ambulatory Visit: Payer: Self-pay | Admitting: *Deleted
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
## 2018-05-03 ENCOUNTER — Other Ambulatory Visit: Payer: Self-pay | Admitting: Psychiatry

## 2018-05-03 DIAGNOSIS — F4310 Post-traumatic stress disorder, unspecified: Secondary | ICD-10-CM

## 2018-05-03 DIAGNOSIS — F331 Major depressive disorder, recurrent, moderate: Secondary | ICD-10-CM

## 2018-05-05 ENCOUNTER — Other Ambulatory Visit: Payer: Self-pay | Admitting: Psychiatry

## 2018-05-05 DIAGNOSIS — Z7689 Persons encountering health services in other specified circumstances: Secondary | ICD-10-CM | POA: Diagnosis not present

## 2018-05-06 LAB — VALPROIC ACID LEVEL: Valproic Acid Lvl: 30 ug/mL — ABNORMAL LOW (ref 50–100)

## 2018-05-11 ENCOUNTER — Telehealth: Payer: Self-pay | Admitting: Psychiatry

## 2018-05-11 NOTE — Telephone Encounter (Signed)
Attempted to call patient to discuss depakote level - subtherapeutic- 30 - subtherapeutic.  Unavailable at this time - will discuss when pt comes in for appointment.

## 2018-05-25 ENCOUNTER — Ambulatory Visit: Payer: PPO | Admitting: Licensed Clinical Social Worker

## 2018-05-30 ENCOUNTER — Other Ambulatory Visit: Payer: Self-pay | Admitting: Family Medicine

## 2018-06-06 ENCOUNTER — Other Ambulatory Visit: Payer: Self-pay | Admitting: Psychiatry

## 2018-06-06 DIAGNOSIS — F331 Major depressive disorder, recurrent, moderate: Secondary | ICD-10-CM

## 2018-06-06 DIAGNOSIS — F4310 Post-traumatic stress disorder, unspecified: Secondary | ICD-10-CM

## 2018-06-07 ENCOUNTER — Telehealth: Payer: Self-pay

## 2018-06-07 NOTE — Telephone Encounter (Signed)
Copied from [place] #[id number]. Topic: General - Other >> Jun 07, 2018  2:29 PM Yvette Rack wrote: Reason for CRM: pt calling stating that Dr Manuella Ghazi forgot to sign off on her cpap machine order [facility] [phone] called her today stated that the order need to be signed

## 2018-06-07 NOTE — Telephone Encounter (Signed)
Vito Backers will fax it now

## 2018-06-23 ENCOUNTER — Telehealth: Payer: Self-pay

## 2018-06-23 NOTE — Telephone Encounter (Signed)
06/23/2018 Spoke with patient about resource for hearing aid. She stated she had already been given resources for food, transportation and medication.MA

## 2018-06-26 ENCOUNTER — Other Ambulatory Visit: Payer: Self-pay | Admitting: Psychiatry

## 2018-06-26 DIAGNOSIS — F4310 Post-traumatic stress disorder, unspecified: Secondary | ICD-10-CM

## 2018-06-26 DIAGNOSIS — F331 Major depressive disorder, recurrent, moderate: Secondary | ICD-10-CM

## 2018-06-28 ENCOUNTER — Ambulatory Visit: Payer: PPO | Admitting: Licensed Clinical Social Worker

## 2018-06-28 ENCOUNTER — Ambulatory Visit: Payer: PPO | Admitting: Psychiatry

## 2018-07-05 ENCOUNTER — Ambulatory Visit: Payer: PPO | Admitting: Psychiatry

## 2018-07-11 DIAGNOSIS — G4733 Obstructive sleep apnea (adult) (pediatric): Secondary | ICD-10-CM | POA: Diagnosis not present

## 2018-07-12 ENCOUNTER — Other Ambulatory Visit: Payer: Self-pay | Admitting: Nurse Practitioner

## 2018-07-12 DIAGNOSIS — F331 Major depressive disorder, recurrent, moderate: Secondary | ICD-10-CM

## 2018-07-12 DIAGNOSIS — F4310 Post-traumatic stress disorder, unspecified: Secondary | ICD-10-CM

## 2018-07-19 ENCOUNTER — Ambulatory Visit: Payer: PPO | Admitting: Licensed Clinical Social Worker

## 2018-07-23 ENCOUNTER — Other Ambulatory Visit: Payer: Self-pay | Admitting: Psychiatry

## 2018-07-23 DIAGNOSIS — F4310 Post-traumatic stress disorder, unspecified: Secondary | ICD-10-CM

## 2018-07-23 DIAGNOSIS — F331 Major depressive disorder, recurrent, moderate: Secondary | ICD-10-CM

## 2018-07-26 ENCOUNTER — Ambulatory Visit (INDEPENDENT_AMBULATORY_CARE_PROVIDER_SITE_OTHER): Payer: PPO | Admitting: Family Medicine

## 2018-07-26 ENCOUNTER — Encounter: Payer: Self-pay | Admitting: Family Medicine

## 2018-07-26 ENCOUNTER — Other Ambulatory Visit
Admission: RE | Admit: 2018-07-26 | Discharge: 2018-07-26 | Disposition: A | Discharge status: Home / Self Care | Payer: PPO | Source: Ambulatory Visit | Attending: Family Medicine | Admitting: Family Medicine

## 2018-07-26 VITALS — BP 150/70 | HR 76 | Temp 98.1°F | Resp 16 | Ht 65.0 in | Wt 176.1 lb

## 2018-07-26 DIAGNOSIS — F4310 Post-traumatic stress disorder, unspecified: Secondary | ICD-10-CM | POA: Diagnosis not present

## 2018-07-26 DIAGNOSIS — R7303 Prediabetes: Secondary | ICD-10-CM | POA: Insufficient documentation

## 2018-07-26 DIAGNOSIS — F1220 Cannabis dependence, uncomplicated: Secondary | ICD-10-CM | POA: Diagnosis not present

## 2018-07-26 DIAGNOSIS — E7800 Pure hypercholesterolemia, unspecified: Secondary | ICD-10-CM | POA: Diagnosis not present

## 2018-07-26 DIAGNOSIS — Z720 Tobacco use: Secondary | ICD-10-CM | POA: Diagnosis not present

## 2018-07-26 DIAGNOSIS — I10 Essential (primary) hypertension: Secondary | ICD-10-CM

## 2018-07-26 DIAGNOSIS — M542 Cervicalgia: Secondary | ICD-10-CM

## 2018-07-26 DIAGNOSIS — K219 Gastro-esophageal reflux disease without esophagitis: Secondary | ICD-10-CM | POA: Diagnosis not present

## 2018-07-26 DIAGNOSIS — F331 Major depressive disorder, recurrent, moderate: Secondary | ICD-10-CM | POA: Diagnosis not present

## 2018-07-26 DIAGNOSIS — G8929 Other chronic pain: Secondary | ICD-10-CM

## 2018-07-26 DIAGNOSIS — R1031 Right lower quadrant pain: Secondary | ICD-10-CM | POA: Diagnosis not present

## 2018-07-26 DIAGNOSIS — L509 Urticaria, unspecified: Secondary | ICD-10-CM

## 2018-07-26 DIAGNOSIS — J3089 Other allergic rhinitis: Secondary | ICD-10-CM

## 2018-07-26 LAB — CBC WITH DIFFERENTIAL/PLATELET
Abs Immature Granulocytes: 0.02 10*3/uL (ref 0.00–0.07)
Basophils Absolute: 0 10*3/uL (ref 0.0–0.1)
Basophils Relative: 0 %
Eosinophils Absolute: 0.1 10*3/uL (ref 0.0–0.5)
Eosinophils Relative: 1 %
HCT: 40.2 % (ref 36.0–46.0)
Hemoglobin: 13.5 g/dL (ref 12.0–15.0)
Immature Granulocytes: 0 %
Lymphocytes Relative: 42 %
Lymphs Abs: 3.6 10*3/uL (ref 0.7–4.0)
MCH: 29.7 pg (ref 26.0–34.0)
MCHC: 33.6 g/dL (ref 30.0–36.0)
MCV: 88.5 fL (ref 80.0–100.0)
Monocytes Absolute: 0.5 10*3/uL (ref 0.1–1.0)
Monocytes Relative: 5 %
Neutro Abs: 4.5 10*3/uL (ref 1.7–7.7)
Neutrophils Relative %: 52 %
Platelets: 185 10*3/uL (ref 150–400)
RBC: 4.54 MIL/uL (ref 3.87–5.11)
RDW: 13.9 % (ref 11.5–15.5)
WBC: 8.6 10*3/uL (ref 4.0–10.5)
nRBC: 0 % (ref 0.0–0.2)

## 2018-07-26 LAB — COMPREHENSIVE METABOLIC PANEL
ALT: 13 U/L (ref 0–44)
AST: 17 U/L (ref 15–41)
Albumin: 3.9 g/dL (ref 3.5–5.0)
Alkaline Phosphatase: 66 U/L (ref 38–126)
Anion gap: 4 — ABNORMAL LOW (ref 5–15)
BUN: 10 mg/dL (ref 6–20)
CO2: 27 mmol/L (ref 22–32)
Calcium: 8.7 mg/dL — ABNORMAL LOW (ref 8.9–10.3)
Chloride: 108 mmol/L (ref 98–111)
Creatinine, Ser: 0.71 mg/dL (ref 0.44–1.00)
GFR calc Af Amer: >60 mL/min (ref >60)
GFR calc non Af Amer: >60 mL/min (ref >60)
Glucose, Bld: 79 mg/dL (ref 70–99)
Potassium: 3.6 mmol/L (ref 3.5–5.1)
Sodium: 139 mmol/L (ref 135–145)
Total Bilirubin: 0.5 mg/dL (ref 0.3–1.2)
Total Protein: 7.2 g/dL (ref 6.5–8.1)

## 2018-07-26 LAB — HEMOGLOBIN A1C
Hgb A1c MFr Bld: 6.4 % — ABNORMAL HIGH (ref 4.8–5.6)
Mean Plasma Glucose: 136.98 mg/dL

## 2018-07-26 MED ORDER — CETIRIZINE HCL 10 MG PO TABS: 10.0000 mg | ORAL_TABLET | Freq: Every day | ORAL | 3 refills | Status: DC

## 2018-07-26 MED ORDER — SERTRALINE HCL 100 MG PO TABS: 100.0000 mg | ORAL_TABLET | Freq: Every day | ORAL | 0 refills | Status: DC

## 2018-07-26 MED ORDER — ROSUVASTATIN CALCIUM 20 MG PO TABS: 20.0000 mg | ORAL_TABLET | Freq: Every day | ORAL | 1 refills | Status: DC

## 2018-07-26 MED ORDER — LISINOPRIL 5 MG PO TABS: 5.0000 mg | ORAL_TABLET | Freq: Every day | ORAL | 1 refills | Status: DC

## 2018-07-26 MED ORDER — LISINOPRIL-HYDROCHLOROTHIAZIDE 10-12.5 MG PO TABS: 1.0000 | ORAL_TABLET | Freq: Every day | ORAL | 0 refills | Status: DC

## 2018-07-26 MED ORDER — DIVALPROEX SODIUM ER 500 MG PO TB24: 500.0000 mg | ORAL_TABLET | Freq: Every day | ORAL | 0 refills | Status: DC

## 2018-07-26 MED ORDER — FLUTICASONE PROPIONATE 50 MCG/ACT NA SUSP: 2.0000 | Freq: Every day | NASAL | 1 refills | Status: DC

## 2018-07-26 MED ORDER — MONTELUKAST SODIUM 10 MG PO TABS: 10.0000 mg | ORAL_TABLET | Freq: Every day | ORAL | 1 refills | Status: DC

## 2018-07-26 NOTE — Addendum Note (Signed)
Addended by: Inda Coke on: 07/26/2018 01:26 PM   Modules accepted: Orders

## 2018-07-26 NOTE — Progress Notes (Signed)
Name: Janice Wilson   MRN: [id number]    DOB: 09-Sep-1955   Date:07/26/2018       Progress Note  Subjective  Chief Complaint  Chief Complaint  Patient presents with  . Medication Refill  . Toe Pain    Right Big Toe has been swollen and hard to move due to the pain   . Gastroesophageal Reflux    States medication is not helping has been to the hospital twice with chest pain  . Post-Traumatic Stress Disorder  . Hypertension    Right ankle has been swollen  . Pain  . Abdominal Pain    Onset- couple of weeks, right lower abdomen    HPI   Abdominal pain: she has noticed a sharp pain on right upper quadrant pain, intermittent, sometimes radiates down to her groin, no dysuria, hematuria or urinary frequency. Started a couple of weeks ago, never had this symptoms before. She states she is worried it was because she used a vibrator and may have been used too deeply - explained unlikely to be the cause . No history of cholecystectomy or kidney stones. Denies nausea or vomiting. No history of appendectomy.   GERD: she has noticed some mucus in her mouth, she smokes, She has been taking omeprazole and states she thinks that is the cause of the mucus formation. She has intermittent heartburn.   HTN: she has been taking medication, no chest pain or palpitation  MDD: seen by Dr. Shea Evans, doing well on zoloft and depakote ER, however she cannot afford going back to see her because she is no longer in network. She needs refills of medications until she moves to [place] where she will find another psychiatrist.   Foot pain: intermittent, she has been wearing UGG boots, advised to change foot wear since she has intermittent symptoms   Patient Active Problem List   Diagnosis Date Noted  . TMJ (temporomandibular joint disorder) 02/07/2018  . Chronic angle-closure glaucoma of eye, left, mild stage 08/24/2017  . 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 Dry eye syndrome of both  lacrimal glands 08/24/2017  . Age-related nuclear cataract of both eyes 08/24/2017  . Murmur, cardiac 12/16/2016  . Disorder of patellofemoral joint 12/16/2016  . Hidradenitis 12/16/2016  . Speech and language deficits 12/16/2016  . Vitamin D deficiency 12/16/2016  . Hypertension 10/11/2016  . Melanosis of colon   . Hyperlipidemia 09/15/2015  . Overweight (BMI 25.0-29.9) 06/17/2015  . Apnea, sleep 04/15/2015  . Asthma, mild intermittent 04/15/2015  . Cervical disc disease 04/15/2015  . Dysfunction of eustachian tube 04/15/2015  . Compulsive tobacco user syndrome 04/15/2015  . Anxiety and depression 01/02/2015  . Chronic cervical pain 01/02/2015  . Cannot sleep 01/02/2015  . Female cystocele 07/31/2014  . Cervical spinal stenosis 07/05/2011    Past Surgical History:  Procedure Laterality Date  . ANTERIOR CERVICAL DECOMP/DISCECTOMY FUSION  07/05/2011   Procedure: ANTERIOR CERVICAL DECOMPRESSION/DISCECTOMY FUSION 3 LEVELS;  Surgeon: Cooper Render Pool;  Location: [place] [facility];  Service: Neurosurgery;  Laterality: N/A;  Cervical Four-Five, Cervical Five-Six, Cervical Six-Seven Anterior Cervical Decompression Fusion WITH ALLOGRAFT AND PLATING  . CESAREAN SECTION    . COLONOSCOPY WITH PROPOFOL N/A 04/16/2016   Procedure: COLONOSCOPY WITH PROPOFOL;  Surgeon: Lucilla Lame, MD;  Location: [facility];  Service: Endoscopy;  Laterality: N/A;  sleep apnea LATEX sensitivity  . ESOPHAGOGASTRODUODENOSCOPY (EGD) WITH PROPOFOL N/A 01/12/2017   Procedure: ESOPHAGOGASTRODUODENOSCOPY (EGD) WITH PROPOFOL;  Surgeon: Jonathon Bellows, MD;  Location: [facility];  Service: Endoscopy;  Laterality: N/A;  Latex sensitivity sleep apnea  . PARTIAL HYSTERECTOMY      Family History  Problem Relation Age of Onset  . Cancer Mother        colon  . Stroke Father   . Drug abuse Sister   . Alcohol abuse Sister   . Anesthesia problems Neg Hx   . Breast cancer Neg Hx     Social History   Socioeconomic  History  . Marital status: Divorced    Spouse name: Not on file  . Number of children: 1  . Years of education: Not on file  . Highest education level: Associate degree: academic program  Occupational History  . Occupation: Disabled  Social Needs  . Financial resource strain: Very hard  . Food insecurity:    Worry: Often true    Inability: Often true  . Transportation needs:    Medical: Yes    Non-medical: Yes  Tobacco Use  . Smoking status: Current Every Day Smoker    Packs/day: 0.25    Years: 25.00    Pack years: 6.25    Types: Cigarettes  . Smokeless tobacco: Never Used  Substance and Sexual Activity  . Alcohol use: Yes    Alcohol/week: 0.0 standard drinks    Comment: 1-2 beer a month(Holidays)  . Drug use: Yes    Types: Marijuana    Comment: daily  . Sexual activity: Not Currently    Partners: Female, Female    Birth control/protection: Condom  Lifestyle  . Physical activity:    Days per week: 7 days    Minutes per session: 30 min  . Stress: To some extent  Relationships  . Social connections:    Talks on phone: Patient refused    Gets together: Patient refused    Attends religious service: 1 to 4 times per year    Active member of club or organization: No    Attends meetings of clubs or organizations: Never    Relationship status: Divorced  . Intimate partner violence:    Fear of current or ex partner: No    Emotionally abused: No    Physically abused: No    Forced sexual activity: No  Other Topics Concern  . Not on file  Social History Narrative  . Not on file     Current Outpatient Medications:  .  acetaminophen (TYLENOL) 325 MG tablet, Take 325 mg by mouth every 6 (six) hours as needed. For pain , Disp: , Rfl:  .  albuterol (PROVENTIL HFA;VENTOLIN HFA) 108 (90 BASE) MCG/ACT inhaler, Inhale 1 puff into the lungs every 4 (four) hours as needed. For shortness of breath, Disp: , Rfl:  .  clindamycin (CLEOCIN T) 1 % lotion, , Disp: , Rfl:  .  conjugated  estrogens (PREMARIN) vaginal cream, Place vaginally., Disp: , Rfl:  .  divalproex (DEPAKOTE ER) 500 MG 24 hr tablet, TAKE 1 TABLET (500 MG TOTAL) BY MOUTH AT BEDTIME. FOR MOOD, Disp: 30 tablet, Rfl: 1 .  doxycycline (VIBRA-TABS) 100 MG tablet, , Disp: , Rfl:  .  fluticasone (FLONASE) 50 MCG/ACT nasal spray, Place 2 sprays into the nose daily as needed. FOR CONGESTION , Disp: , Rfl:  .  Fluticasone-Salmeterol (ADVAIR) 250-50 MCG/DOSE AEPB, Inhale 1 puff into the lungs every 12 (twelve) hours as needed. FOR WHEEZING, Disp: 180 each, Rfl: 1 .  gabapentin (NEURONTIN) 400 MG capsule, TAKE 1 CAPSULE (400 MG TOTAL) BY MOUTH 3 (THREE)  TIMES DAILY, Disp: , Rfl: 1 .  hydrOXYzine (VISTARIL) 25 MG capsule, TAKE 1-2 CAPSULES AS DIRECTED. TAKE 25 MG DAILY AS NEEDED FOR ANXIETY AND 50 MG AT BEDTIME FOR SLEEP, Disp: 270 capsule, Rfl: 1 .  latanoprost (XALATAN) 0.005 % ophthalmic solution, Apply 1 drop to eye at bedtime., Disp: , Rfl:  .  lisinopril (PRINIVIL,ZESTRIL) 5 MG tablet, TAKE 1 TABLET BY MOUTH EVERY DAY, Disp: 90 tablet, Rfl: 0 .  loratadine (CLARITIN) 10 MG tablet, Take 10 mg by mouth daily., Disp: , Rfl: 11 .  montelukast (SINGULAIR) 10 MG tablet, Take 1 tablet (10 mg total) by mouth at bedtime., Disp: 90 tablet, Rfl: 0 .  Multiple Vitamins-Minerals (MULTIPLE VITAMINS/WOMENS PO), Take 1 tablet by mouth daily., Disp: , Rfl:  .  omeprazole (PRILOSEC) 40 MG capsule, Take 1 capsule (40 mg total) by mouth daily., Disp: 90 capsule, Rfl: 0 .  rosuvastatin (CRESTOR) 20 MG tablet, TAKE 1 TABLET BY MOUTH EVERY DAY, Disp: 90 tablet, Rfl: 0 .  sertraline (ZOLOFT) 100 MG tablet, TAKE 1 TABLET BY MOUTH EVERY DAY, Disp: 30 tablet, Rfl: 0 .  timolol (BETIMOL) 0.5 % ophthalmic solution, Place 1 drop into both eyes every morning.  , Disp: , Rfl:  .  tiZANidine (ZANAFLEX) 4 MG tablet, Take 4 mg by mouth as needed., Disp: , Rfl: 1 .  triamcinolone (KENALOG) 0.025 % ointment, Apply 1 application topically 2 (two) times  daily., Disp: 30 g, Rfl: 0 .  cetirizine (ZYRTEC) 10 MG tablet, Take 1 tablet (10 mg total) by mouth daily. (Patient not taking: Reported on 07/26/2018), Disp: 30 tablet, Rfl: 0  Allergies  Allergen Reactions  . Latex Rash    Irritation  (Condoms and gloves)    I personally reviewed active problem list, medication list, allergies, family history, social history with the patient/caregiver today.   ROS  Constitutional: Negative for fever, positive for  weight change.  Respiratory: Negative for cough and shortness of breath.   Cardiovascular: Negative for chest pain or palpitations.  Gastrointestinal: positive  for abdominal pain, positive for  bowel changes.  Musculoskeletal: Negative for gait problem or joint swelling.  Skin: Negative for rash.  Neurological: Negative for dizziness or headache.  No other specific complaints in a complete review of systems (except as listed in HPI above).  Objective  Vitals:   07/26/18 1155  BP: (!) 150/70  Pulse: 76  Resp: 16  Temp: 98.1 F (36.7 C)  TempSrc: Oral  SpO2: 98%  Weight: 176 lb 1.6 oz (79.9 kg)  Height: 5\' 5"  (1.651 m)    Body mass index is 29.3 kg/m.  Physical Exam  Constitutional: Patient appears well-developed and well-nourished. Overweight. No distress.  HEENT: head atraumatic, normocephalic, pupils equal and reactive to light,  neck supple, throat within normal limits Cardiovascular: Normal rate, regular rhythm and normal heart sounds.  No murmur heard. No BLE edema. Pulmonary/Chest: Effort normal and breath sounds normal. No respiratory distress. Abdominal: Soft.  There is tenderness during palpation of right lower quadrant, negative for rebound tenderness, slight decrease in bowel sounds, abdomen slightly distended - unable to button her pants.  Psychiatric: Patient has a normal mood and affect. behavior is normal. Judgment and thought content normal.  Recent Results (from the past 2160 hour(s))  Valproic acid level      Status: Abnormal   Collection Time: 05/05/18  8:31 AM  Result Value Ref Range   Valproic Acid Lvl 30 (L) 50 - 100 ug/mL  Comment:                                 Detection Limit = 4                            <4 indicates None Detected Toxicity may occur at levels of 100-500. Measurements of free unbound valproic acid may improve the assess- ment of clinical response.       PHQ2/9: Depression screen [facility] 2/9 07/26/2018 04/24/2018 04/20/2018 03/16/2018 02/06/2018  Decreased Interest 1 3 2 2 2   Down, Depressed, Hopeless 1 3 3 2  0  PHQ - 2 Score 2 6 5 4 2   Altered sleeping 2 3 3 3  0  Tired, decreased energy 2 3 3 2 3   Change in appetite 1 2 3 3  0  Feeling bad or failure about yourself  1 2 2 1  0  Trouble concentrating 3 3 3 3 2   Moving slowly or fidgety/restless 3 3 3 3 3   Suicidal thoughts 0 0 0 0 0  PHQ-9 Score 14 22 22 19 10   Difficult doing work/chores Somewhat difficult Very difficult Extremely dIfficult Extremely dIfficult Very difficult  Some recent data might be hidden    Fall Risk: Fall Risk  07/26/2018 04/24/2018 03/16/2018 02/06/2018 12/21/2017  Falls in the past year? 0 0 No No No  Number falls in past yr: - 0 - - -  Injury with Fall? - 0 - - -  Risk for fall due to : - - - - -  Risk for fall due to: Comment - - - - -     Functional Status Survey: Is the patient deaf or have difficulty hearing?: No Does the patient have difficulty seeing, even when wearing glasses/contacts?: Yes Does the patient have difficulty concentrating, remembering, or making decisions?: No Does the patient have difficulty walking or climbing stairs?: No Does the patient have difficulty dressing or bathing?: No Does the patient have difficulty doing errands alone such as visiting a doctor's office or shopping?: No    Assessment & Plan  1. MDD (major depressive disorder), recurrent episode, moderate (HCC)  - divalproex (DEPAKOTE ER) 500 MG 24 hr tablet; Take 1 tablet (500 mg total) by  mouth at bedtime. For mood  Dispense: 90 tablet; Refill: 0 - sertraline (ZOLOFT) 100 MG tablet; Take 1 tablet (100 mg total) by mouth daily.  Dispense: 90 tablet; Refill: 0  2. Urticaria, unspecified  - cetirizine (ZYRTEC) 10 MG tablet; Take 1 tablet (10 mg total) by mouth daily.  Dispense: 90 tablet; Refill: 3  3. PTSD (post-traumatic stress disorder)  - divalproex (DEPAKOTE ER) 500 MG 24 hr tablet; Take 1 tablet (500 mg total) by mouth at bedtime. For mood  Dispense: 90 tablet; Refill: 0 - sertraline (ZOLOFT) 100 MG tablet; Take 1 tablet (100 mg total) by mouth daily.  Dispense: 90 tablet; Refill: 0  4. Essential hypertension  - lisinopril-hydrochlorothiazide (PRINZIDE,ZESTORETIC) 10-12.5 MG tablet; Take 1 tablet by mouth daily.  Dispense: 90 tablet; Refill: 0  5. Environmental and seasonal allergies  - fluticasone (FLONASE) 50 MCG/ACT nasal spray; Place 2 sprays into both nostrils daily. FOR CONGESTION  Dispense: 48 g; Refill: 1 - montelukast (SINGULAIR) 10 MG tablet; Take 1 tablet (10 mg total) by mouth at bedtime.  Dispense: 90 tablet; Refill: 1  6. Chronic cervical pain  Contact Dr. Orpah Melter for gabapentin  refills   7. Prediabetes  A1C   8. Gastroesophageal reflux disease without esophagitis  Still on omeprazole, may need to see GI  9. Pure hypercholesterolemia  - rosuvastatin (CRESTOR) 20 MG tablet; Take 1 tablet (20 mg total) by mouth daily.  Dispense: 90 tablet; Refill: 1  10. Cannabis use disorder, moderate, dependence ([place])  Advised to quit again  11. Tobacco use  She cannot tolerate chantix    12. Right lower quadrant abdominal pain  - CT ABDOMEN PELVIS W CONTRAST; Future

## 2018-07-27 ENCOUNTER — Encounter: Payer: Self-pay | Admitting: Family Medicine

## 2018-07-27 ENCOUNTER — Ambulatory Visit: Admission: RE | Admit: 2018-07-27 | Payer: PPO | Source: Ambulatory Visit

## 2018-07-27 DIAGNOSIS — R7303 Prediabetes: Secondary | ICD-10-CM | POA: Insufficient documentation

## 2018-08-02 ENCOUNTER — Ambulatory Visit: Admission: RE | Admit: 2018-08-02 | Payer: PPO | Source: Ambulatory Visit

## 2018-08-05 ENCOUNTER — Other Ambulatory Visit: Payer: Self-pay | Admitting: Psychiatry

## 2018-08-05 DIAGNOSIS — F4310 Post-traumatic stress disorder, unspecified: Secondary | ICD-10-CM

## 2018-08-05 DIAGNOSIS — F331 Major depressive disorder, recurrent, moderate: Secondary | ICD-10-CM

## 2018-08-11 DIAGNOSIS — G4733 Obstructive sleep apnea (adult) (pediatric): Secondary | ICD-10-CM | POA: Diagnosis not present

## 2018-08-14 ENCOUNTER — Telehealth: Payer: Self-pay | Admitting: Family Medicine

## 2018-08-14 NOTE — Telephone Encounter (Signed)
Spoke with patient and she received all her regular medications it was just the one for her foot she was missing. Dr. Ancil Boozer verbalized to tee'd up Meloxicam 15 mg with 30 pills for her to sign off. Patient was notified.

## 2018-08-14 NOTE — Telephone Encounter (Signed)
Copied from [employer] [id number]. Topic: Quick Communication - See Telephone Encounter >> Aug 14, 2018 10:09 AM Ivar Drape wrote: CRM for notification. See Telephone encounter for: 08/14/18. Patient stated that her pharmacy [facility] has not received any of the 7 medications that Dr. Ancil Boozer prescribed for her on 07/26/18.  She said she is experiencing a lot of right foot pain because she do not have the medication to take for it.

## 2018-08-14 NOTE — Telephone Encounter (Signed)
Rx follow up- patient states Rx not at pharmacy.

## 2018-08-15 MED ORDER — MELOXICAM 15 MG PO TABS: 15.0000 mg | ORAL_TABLET | Freq: Every day | ORAL | 0 refills | Status: DC

## 2018-08-17 ENCOUNTER — Ambulatory Visit: Payer: PPO | Admitting: Psychiatry

## 2018-08-17 ENCOUNTER — Encounter: Payer: Self-pay | Admitting: Psychiatry

## 2018-08-17 ENCOUNTER — Other Ambulatory Visit: Payer: Self-pay

## 2018-08-17 VITALS — BP 123/78 | HR 76 | Temp 98.4°F | Wt 174.2 lb

## 2018-08-17 DIAGNOSIS — F17200 Nicotine dependence, unspecified, uncomplicated: Secondary | ICD-10-CM

## 2018-08-17 DIAGNOSIS — F331 Major depressive disorder, recurrent, moderate: Secondary | ICD-10-CM

## 2018-08-17 DIAGNOSIS — G4701 Insomnia due to medical condition: Secondary | ICD-10-CM

## 2018-08-17 DIAGNOSIS — F1220 Cannabis dependence, uncomplicated: Secondary | ICD-10-CM | POA: Diagnosis not present

## 2018-08-17 DIAGNOSIS — F4310 Post-traumatic stress disorder, unspecified: Secondary | ICD-10-CM | POA: Diagnosis not present

## 2018-08-17 MED ORDER — DIVALPROEX SODIUM ER 500 MG PO TB24: 1000.0000 mg | ORAL_TABLET | Freq: Every day | ORAL | 0 refills | Status: DC

## 2018-08-17 NOTE — Progress Notes (Signed)
[facility] OP Progress Note  08/17/2018 12:00 PM Janice Wilson  MRN:  [id number]  Chief Complaint:  Chief Complaint    Follow-up; Medication Refill    ' I am here for follow up.'  HPI: [name] is a 63 yr old African-American female, divorced, on SSD, lives in [place], has a history of depression, anxiety, chronic pain, fusion surgeries of her back, hypertension, cannabis abuse, tobacco use disorder, presented to clinic today for a follow-up visit.  Patient was last seen in clinic on 04/26/2018.  Patient has been noncompliant with her follow-up visits.  Patient today returns reporting mood lability.  She reports anxiety symptoms, sadness, sleep problems.  This has been worsening since the past few weeks.  Patient reports she is compliant on her Depakote and Zoloft as prescribed.  She continues to take hydroxyzine at bedtime for sleep.  She however reports her sleep continues to be restless.  Patient continues to smoke cannabis.  She also smokes cigarettes.  She reports she is trying to cut down.  Patient reports she does have financial problems and hence was unable to come in for follow-up visits with Probation officer as well as a therapist.  Discussed with her the need for compliance with medications as well as follow-up.  Discussed with her that she can be referred out to the community where it is more affordable for her if she continues to have problems.  She declines.  She reports she wants to be seen in this clinic and reports she is motivated to be compliant.   Visit Diagnosis:    ICD-10-CM   1. PTSD (post-traumatic stress disorder) F43.10 divalproex (DEPAKOTE ER) 500 MG 24 hr tablet  2. MDD (major depressive disorder), recurrent episode, moderate (HCC) F33.1   3. Insomnia due to medical condition G47.01   4. Cannabis use disorder, moderate, dependence (HCC) F12.20   5. Tobacco use disorder F17.200     Past Psychiatric History: I have reviewed past psychiatric history from my progress note  on 04/10/2018.  Past trials of Zoloft, lorazepam.  Past Medical History:  Past Medical History:  Diagnosis Date  . Anxiety   . Asthma   . Back pain   . Dental crown present    dental implants - top, front  . Depression   . GERD (gastroesophageal reflux disease)   . Glaucoma   . Headache(784.0)   . Heart murmur    followed by PCP  . Neck pain    s/p fusion.  limited side-to-side motion.  No limits up and down motion.  . Neck pain   . Panic attack   . Shortness of breath   . Sleep apnea    uses CPAP machine sleep study 9/ 2012 done at sleep med in [place]  . Vertigo    no episodes in over 10 yrs  . Wears dentures    partial upper  . Wears hearing aid    left ear    Past Surgical History:  Procedure Laterality Date  . ANTERIOR CERVICAL DECOMP/DISCECTOMY FUSION  07/05/2011   Procedure: ANTERIOR CERVICAL DECOMPRESSION/DISCECTOMY FUSION 3 LEVELS;  Surgeon: Cooper Render Pool;  Location: [place] [facility];  Service: Neurosurgery;  Laterality: N/A;  Cervical Four-Five, Cervical Five-Six, Cervical Six-Seven Anterior Cervical Decompression Fusion WITH ALLOGRAFT AND PLATING  . CESAREAN SECTION    . COLONOSCOPY WITH PROPOFOL N/A 04/16/2016   Procedure: COLONOSCOPY WITH PROPOFOL;  Surgeon: Lucilla Lame, MD;  Location: [facility];  Service: Endoscopy;  Laterality: N/A;  sleep apnea  LATEX sensitivity  . ESOPHAGOGASTRODUODENOSCOPY (EGD) WITH PROPOFOL N/A 01/12/2017   Procedure: ESOPHAGOGASTRODUODENOSCOPY (EGD) WITH PROPOFOL;  Surgeon: Jonathon Bellows, MD;  Location: [facility];  Service: Endoscopy;  Laterality: N/A;  Latex sensitivity sleep apnea  . PARTIAL HYSTERECTOMY      Family Psychiatric History: Reviewed family psychiatric history from my progress note on 04/10/2018  Family History:  Family History  Problem Relation Age of Onset  . Cancer Mother        colon  . Stroke Father   . Drug abuse Sister   . Alcohol abuse Sister   . Anesthesia problems Neg Hx   . Breast  cancer Neg Hx     Social History: Reviewed social history from my progress note on 04/10/2018. Social History   Socioeconomic History  . Marital status: Divorced    Spouse name: Not on file  . Number of children: 1  . Years of education: Not on file  . Highest education level: Associate degree: academic program  Occupational History  . Occupation: Disabled  Social Needs  . Financial resource strain: Very hard  . Food insecurity:    Worry: Often true    Inability: Often true  . Transportation needs:    Medical: Yes    Non-medical: Yes  Tobacco Use  . Smoking status: Current Every Day Smoker    Packs/day: 0.25    Years: 25.00    Pack years: 6.25    Types: Cigarettes  . Smokeless tobacco: Never Used  Substance and Sexual Activity  . Alcohol use: Yes    Alcohol/week: 0.0 standard drinks    Comment: 1-2 beer a month(Holidays)  . Drug use: Yes    Types: Marijuana    Comment: daily  . Sexual activity: Not Currently    Partners: Female, Female    Birth control/protection: Condom  Lifestyle  . Physical activity:    Days per week: 7 days    Minutes per session: 30 min  . Stress: To some extent  Relationships  . Social connections:    Talks on phone: Patient refused    Gets together: Patient refused    Attends religious service: 1 to 4 times per year    Active member of club or organization: No    Attends meetings of clubs or organizations: Never    Relationship status: Divorced  Other Topics Concern  . Not on file  Social History Narrative  . Not on file    Allergies:  Allergies  Allergen Reactions  . Latex Rash    Irritation  (Condoms and gloves)    Metabolic Disorder Labs: Lab Results  Component Value Date   HGBA1C 6.4 (H) 07/26/2018   MPG 136.98 07/26/2018   MPG 126 02/06/2018   No results found for: PROLACTIN Lab Results  Component Value Date   CHOL 147 10/27/2017   TRIG 100 10/27/2017   HDL 44 (L) 10/27/2017   CHOLHDL 3.3 10/27/2017   VLDL 17  02/14/2017   LDLCALC 83 10/27/2017   LDLCALC 173 (H) 02/14/2017   Lab Results  Component Value Date   TSH 0.70 08/10/2016   TSH 0.34 (L) 09/14/2013    Therapeutic Level Labs: No results found for: LITHIUM Lab Results  Component Value Date   VALPROATE 30 (L) 05/05/2018   No components found for:  CBMZ  Current Medications: Current Outpatient Medications  Medication Sig Dispense Refill  . acetaminophen (TYLENOL) 325 MG tablet Take 325 mg by mouth every 6 (six) hours as needed. For  pain     . albuterol (PROVENTIL HFA;VENTOLIN HFA) 108 (90 BASE) MCG/ACT inhaler Inhale 1 puff into the lungs every 4 (four) hours as needed. For shortness of breath    . cetirizine (ZYRTEC) 10 MG tablet Take 1 tablet (10 mg total) by mouth daily. 90 tablet 3  . clindamycin (CLEOCIN T) 1 % lotion     . conjugated estrogens (PREMARIN) vaginal cream Place vaginally.    . divalproex (DEPAKOTE ER) 500 MG 24 hr tablet Take 2 tablets (1,000 mg total) by mouth at bedtime. 180 tablet 0  . fluticasone (FLONASE) 50 MCG/ACT nasal spray Place 2 sprays into both nostrils daily. FOR CONGESTION 48 g 1  . Fluticasone-Salmeterol (ADVAIR) 250-50 MCG/DOSE AEPB Inhale 1 puff into the lungs every 12 (twelve) hours as needed. FOR WHEEZING 180 each 1  . gabapentin (NEURONTIN) 400 MG capsule TAKE 1 CAPSULE (400 MG TOTAL) BY MOUTH 3 (THREE) TIMES DAILY  1  . hydrOXYzine (VISTARIL) 25 MG capsule TAKE 1-2 CAPSULES AS DIRECTED. TAKE 25 MG DAILY AS NEEDED FOR ANXIETY AND 50 MG AT BEDTIME FOR SLEEP 270 capsule 0  . latanoprost (XALATAN) 0.005 % ophthalmic solution Apply 1 drop to eye at bedtime.    Marland Kitchen latanoprost (XALATAN) 0.005 % ophthalmic solution Apply to eye.    Marland Kitchen lisinopril-hydrochlorothiazide (PRINZIDE,ZESTORETIC) 10-12.5 MG tablet Take 1 tablet by mouth daily. 90 tablet 0  . meloxicam (MOBIC) 15 MG tablet Take 1 tablet (15 mg total) by mouth daily. 30 tablet 0  . montelukast (SINGULAIR) 10 MG tablet Take 1 tablet (10 mg total) by  mouth at bedtime. 90 tablet 1  . Multiple Vitamins-Minerals (MULTIPLE VITAMINS/WOMENS PO) Take 1 tablet by mouth daily.    Marland Kitchen omeprazole (PRILOSEC) 40 MG capsule Take 1 capsule (40 mg total) by mouth daily. 90 capsule 0  . rosuvastatin (CRESTOR) 20 MG tablet Take 1 tablet (20 mg total) by mouth daily. 90 tablet 1  . sertraline (ZOLOFT) 100 MG tablet Take 1 tablet (100 mg total) by mouth daily. 90 tablet 0  . timolol (BETIMOL) 0.5 % ophthalmic solution Place 1 drop into both eyes every morning.      Marland Kitchen tiZANidine (ZANAFLEX) 4 MG tablet Take 4 mg by mouth as needed.  1  . triamcinolone (KENALOG) 0.025 % ointment Apply 1 application topically 2 (two) times daily. 30 g 0   No current facility-administered medications for this visit.      Musculoskeletal: Strength & Muscle Tone: within normal limits Gait & Station: normal Patient leans: N/A  Psychiatric Specialty Exam: Review of Systems  Psychiatric/Behavioral: Positive for depression. The patient is nervous/anxious and has insomnia.   All other systems reviewed and are negative.   Blood pressure 123/78, pulse 76, temperature 98.4 F (36.9 C), temperature source Oral, weight 174 lb 3.2 oz (79 kg).Body mass index is 28.99 kg/m.  General Appearance: Casual  Eye Contact:  Fair  Speech:  Clear and Coherent  Volume:  Normal  Mood:  Anxious and Dysphoric  Affect:  Congruent  Thought Process:  Goal Directed and Descriptions of Associations: Intact  Orientation:  Full (Time, Place, and Person)  Thought Content: WDL   Suicidal Thoughts:  No  Homicidal Thoughts:  No  Memory:  Immediate;   Fair Recent;   Fair Remote;   Fair  Judgement:  Fair  Insight:  Fair  Psychomotor Activity:  Normal  Concentration:  Concentration: Fair and Attention Span: Fair  Recall:  [employer] of Knowledge: Fair  Language: Fair  Akathisia:  No  Handed:  Right  AIMS (if indicated): denies rigidity,stiffness  Assets:  Communication Skills Desire for  Improvement Social Support  ADL's:  Intact  Cognition: WNL  Sleep:  restless   Screenings: GAD-7     Office Visit from 07/26/2018 in [facility] Office Visit from 04/24/2018 in [facility] Office Visit from 03/16/2018 in [facility] Office Visit from 12/21/2017 in [facility] Office Visit from 10/27/2017 in [facility]  Total GAD-7 Score  17  20  20   0  18    PHQ2-9     Office Visit from 07/26/2018 in [facility] Office Visit from 04/24/2018 in [facility] Patient Outreach Telephone from 04/20/2018 in [place] Visit from 03/16/2018 in [facility] Office Visit from 02/06/2018 in [facility]  PHQ-2 Total Score  2  6  5  4  2   PHQ-9 Total Score  14  22  22  19  10        Assessment and Plan: Shakendra is a 63 yr old African-American female who is on SSD, lives in [place], has a history of depression, chronic pain, hypertension, presented to clinic today for a follow-up visit.  Patient is biologically predisposed given her history of trauma as well as substance abuse problems and chronic pain.  Patient also has psychosocial stressors of her own health issues, relationship struggles with her daughter.  Patient continues to struggle with mood lability.  Patient was noncompliant with her follow-ups.  Discussed plan as noted below.  Plan MDD-unstable Zoloft at reduced dosage of 100 mg p.o. daily Increase Depakote ER to 1000 mg p.o. nightly Provided lab slip to get Depakote level done- she will go to [facility] in a week from now. Reviewed labs in [facility] dated 05/11/2018- Depakote level-30-subtherapeutic  For PTSD-unstable Patient was referred for CBT however she was noncompliant.  Will refer her again. Continue Depakote as prescribed Zoloft as prescribed   For insomnia- restless Discussed with her that  Depakote may help with her sleep. Continue hydroxyzine 50 mg p.o. nightly as needed  Cannabis use disorder-unstable Provided substance abuse counseling  For tobacco use disorder-unstable Provided counseling   Follow-up in clinic in 4 weeks or sooner if needed.  I have spent atleast 25 minutes face to face with patient today. More than 50 % of the time was spent for psychoeducation and supportive psychotherapy and care coordination.  This note was generated in part or whole with voice recognition software. Voice recognition is usually quite accurate but there are transcription errors that can and very often do occur. I apologize for any typographical errors that were not detected and corrected.       Ursula Alert, MD 08/18/2018, 8:27 AM

## 2018-08-18 ENCOUNTER — Other Ambulatory Visit: Payer: Self-pay

## 2018-08-18 ENCOUNTER — Encounter: Payer: Self-pay | Admitting: Psychiatry

## 2018-08-18 DIAGNOSIS — K219 Gastro-esophageal reflux disease without esophagitis: Secondary | ICD-10-CM

## 2018-08-18 NOTE — Telephone Encounter (Signed)
Refill request for general medication. Omeprazole to [facility]  Last office visit 07/26/2018   Follow up on 11/22/2018

## 2018-08-19 MED ORDER — OMEPRAZOLE 40 MG PO CPDR: 40.0000 mg | DELAYED_RELEASE_CAPSULE | Freq: Every day | ORAL | 0 refills | Status: DC

## 2018-08-29 ENCOUNTER — Other Ambulatory Visit: Payer: Self-pay | Admitting: Psychiatry

## 2018-08-29 DIAGNOSIS — F331 Major depressive disorder, recurrent, moderate: Secondary | ICD-10-CM | POA: Diagnosis not present

## 2018-08-30 LAB — VALPROIC ACID LEVEL: Valproic Acid Lvl: 83 ug/mL (ref 50–100)

## 2018-08-31 ENCOUNTER — Ambulatory Visit: Admission: RE | Admit: 2018-08-31 | Payer: PPO | Source: Ambulatory Visit

## 2018-09-04 ENCOUNTER — Other Ambulatory Visit: Payer: Self-pay | Admitting: Family Medicine

## 2018-09-04 ENCOUNTER — Other Ambulatory Visit: Payer: Self-pay

## 2018-09-04 ENCOUNTER — Ambulatory Visit
Admission: RE | Admit: 2018-09-04 | Discharge: 2018-09-04 | Disposition: A | Discharge status: Home / Self Care | Payer: PPO | Source: Ambulatory Visit | Attending: Family Medicine | Admitting: Family Medicine

## 2018-09-04 DIAGNOSIS — R1031 Right lower quadrant pain: Secondary | ICD-10-CM | POA: Diagnosis not present

## 2018-09-04 DIAGNOSIS — N200 Calculus of kidney: Secondary | ICD-10-CM

## 2018-09-04 HISTORY — DX: Essential (primary) hypertension: I10

## 2018-09-04 MED ORDER — IOHEXOL 300 MG/ML  SOLN
100.0000 mL | Freq: Once | INTRAMUSCULAR | Status: AC | PRN
  Administered 2018-09-04: 100 mL via INTRAVENOUS

## 2018-09-06 ENCOUNTER — Other Ambulatory Visit: Payer: Self-pay | Admitting: Family Medicine

## 2018-09-06 NOTE — Telephone Encounter (Signed)
Refill request for general medication. Meloxicam  Last office visit 07/26/2018   Follow up on  11/22/2018

## 2018-09-09 DIAGNOSIS — G4733 Obstructive sleep apnea (adult) (pediatric): Secondary | ICD-10-CM | POA: Diagnosis not present

## 2018-09-11 DIAGNOSIS — G4733 Obstructive sleep apnea (adult) (pediatric): Secondary | ICD-10-CM | POA: Diagnosis not present

## 2018-09-14 ENCOUNTER — Other Ambulatory Visit: Payer: Self-pay

## 2018-09-14 ENCOUNTER — Telehealth: Payer: Self-pay

## 2018-09-14 ENCOUNTER — Ambulatory Visit (INDEPENDENT_AMBULATORY_CARE_PROVIDER_SITE_OTHER): Payer: PPO | Admitting: Psychiatry

## 2018-09-14 NOTE — Telephone Encounter (Signed)
pt left message that she never received a call or an email about the visit today

## 2018-09-14 NOTE — Progress Notes (Deleted)
[facility] MD OP Progress Note  09/14/2018 11:32 AM Janice Wilson  MRN:  [id number]  Chief Complaint:  HPI: *** Visit Diagnosis: No diagnosis found.  Past Psychiatric History: ***  Past Medical History:  Past Medical History:  Diagnosis Date  . Anxiety   . Asthma   . Back pain   . Dental crown present    dental implants - top, front  . Depression   . GERD (gastroesophageal reflux disease)   . Glaucoma   . Headache(784.0)   . Heart murmur    followed by PCP  . Hypertension   . Neck pain    s/p fusion.  limited side-to-side motion.  No limits up and down motion.  . Neck pain   . Panic attack   . Shortness of breath   . Sleep apnea    uses CPAP machine sleep study 9/ 2012 done at sleep med in [place]  . Vertigo    no episodes in over 10 yrs  . Wears dentures    partial upper  . Wears hearing aid    left ear    Past Surgical History:  Procedure Laterality Date  . ANTERIOR CERVICAL DECOMP/DISCECTOMY FUSION  07/05/2011   Procedure: ANTERIOR CERVICAL DECOMPRESSION/DISCECTOMY FUSION 3 LEVELS;  Surgeon: Cooper Render Pool;  Location: [place] [facility];  Service: Neurosurgery;  Laterality: N/A;  Cervical Four-Five, Cervical Five-Six, Cervical Six-Seven Anterior Cervical Decompression Fusion WITH ALLOGRAFT AND PLATING  . CESAREAN SECTION    . COLONOSCOPY WITH PROPOFOL N/A 04/16/2016   Procedure: COLONOSCOPY WITH PROPOFOL;  Surgeon: Lucilla Lame, MD;  Location: [place];  Service: Endoscopy;  Laterality: N/A;  sleep apnea LATEX sensitivity  . ESOPHAGOGASTRODUODENOSCOPY (EGD) WITH PROPOFOL N/A 01/12/2017   Procedure: ESOPHAGOGASTRODUODENOSCOPY (EGD) WITH PROPOFOL;  Surgeon: Jonathon Bellows, MD;  Location: [place];  Service: Endoscopy;  Laterality: N/A;  Latex sensitivity sleep apnea  . PARTIAL HYSTERECTOMY      Family Psychiatric History: ***  Family History:  Family History  Problem Relation Age of Onset  . Cancer Mother        colon  . Stroke Father   . Drug abuse  Sister   . Alcohol abuse Sister   . Anesthesia problems Neg Hx   . Breast cancer Neg Hx     Social History:  Social History   Socioeconomic History  . Marital status: Divorced    Spouse name: Not on file  . Number of children: 1  . Years of education: Not on file  . Highest education level: Associate degree: academic program  Occupational History  . Occupation: Disabled  Social Needs  . Financial resource strain: Very hard  . Food insecurity:    Worry: Often true    Inability: Often true  . Transportation needs:    Medical: Yes    Non-medical: Yes  Tobacco Use  . Smoking status: Current Every Day Smoker    Packs/day: 0.25    Years: 25.00    Pack years: 6.25    Types: Cigarettes  . Smokeless tobacco: Never Used  Substance and Sexual Activity  . Alcohol use: Yes    Alcohol/week: 0.0 standard drinks    Comment: 1-2 beer a month(Holidays)  . Drug use: Yes    Types: Marijuana    Comment: daily  . Sexual activity: Not Currently    Partners: Female, Female    Birth control/protection: Condom  Lifestyle  . Physical activity:    Days per week: 7 days  Minutes per session: 30 min  . Stress: To some extent  Relationships  . Social connections:    Talks on phone: Patient refused    Gets together: Patient refused    Attends religious service: 1 to 4 times per year    Active member of club or organization: No    Attends meetings of clubs or organizations: Never    Relationship status: Divorced  Other Topics Concern  . Not on file  Social History Narrative  . Not on file    Allergies:  Allergies  Allergen Reactions  . Latex Rash    Irritation  (Condoms and gloves)    Metabolic Disorder Labs: Lab Results  Component Value Date   HGBA1C 6.4 (H) 07/26/2018   MPG 136.98 07/26/2018   MPG 126 02/06/2018   No results found for: PROLACTIN Lab Results  Component Value Date   CHOL 147 10/27/2017   TRIG 100 10/27/2017   HDL 44 (L) 10/27/2017   CHOLHDL 3.3  10/27/2017   VLDL 17 02/14/2017   LDLCALC 83 10/27/2017   LDLCALC 173 (H) 02/14/2017   Lab Results  Component Value Date   TSH 0.70 08/10/2016   TSH 0.34 (L) 09/14/2013    Therapeutic Level Labs: No results found for: LITHIUM Lab Results  Component Value Date   VALPROATE 83 08/29/2018   VALPROATE 30 (L) 05/05/2018   No components found for:  CBMZ  Current Medications: Current Outpatient Medications  Medication Sig Dispense Refill  . acetaminophen (TYLENOL) 325 MG tablet Take 325 mg by mouth every 6 (six) hours as needed. For pain     . albuterol (PROVENTIL HFA;VENTOLIN HFA) 108 (90 BASE) MCG/ACT inhaler Inhale 1 puff into the lungs every 4 (four) hours as needed. For shortness of breath    . cetirizine (ZYRTEC) 10 MG tablet Take 1 tablet (10 mg total) by mouth daily. 90 tablet 3  . clindamycin (CLEOCIN T) 1 % lotion     . conjugated estrogens (PREMARIN) vaginal cream Place vaginally.    . divalproex (DEPAKOTE ER) 500 MG 24 hr tablet Take 2 tablets (1,000 mg total) by mouth at bedtime. 180 tablet 0  . fluticasone (FLONASE) 50 MCG/ACT nasal spray Place 2 sprays into both nostrils daily. FOR CONGESTION 48 g 1  . Fluticasone-Salmeterol (ADVAIR) 250-50 MCG/DOSE AEPB Inhale 1 puff into the lungs every 12 (twelve) hours as needed. FOR WHEEZING 180 each 1  . gabapentin (NEURONTIN) 400 MG capsule TAKE 1 CAPSULE (400 MG TOTAL) BY MOUTH 3 (THREE) TIMES DAILY  1  . hydrOXYzine (VISTARIL) 25 MG capsule TAKE 1-2 CAPSULES AS DIRECTED. TAKE 25 MG DAILY AS NEEDED FOR ANXIETY AND 50 MG AT BEDTIME FOR SLEEP 270 capsule 0  . latanoprost (XALATAN) 0.005 % ophthalmic solution Apply 1 drop to eye at bedtime.    Marland Kitchen latanoprost (XALATAN) 0.005 % ophthalmic solution Apply to eye.    Marland Kitchen lisinopril-hydrochlorothiazide (PRINZIDE,ZESTORETIC) 10-12.5 MG tablet Take 1 tablet by mouth daily. 90 tablet 0  . meloxicam (MOBIC) 15 MG tablet Take 1 tablet (15 mg total) by mouth daily. 30 tablet 0  . montelukast  (SINGULAIR) 10 MG tablet Take 1 tablet (10 mg total) by mouth at bedtime. 90 tablet 1  . Multiple Vitamins-Minerals (MULTIPLE VITAMINS/WOMENS PO) Take 1 tablet by mouth daily.    Marland Kitchen omeprazole (PRILOSEC) 40 MG capsule Take 1 capsule (40 mg total) by mouth daily. 90 capsule 0  . rosuvastatin (CRESTOR) 20 MG tablet Take 1 tablet (20 mg total) by mouth  daily. 90 tablet 1  . sertraline (ZOLOFT) 100 MG tablet Take 1 tablet (100 mg total) by mouth daily. 90 tablet 0  . timolol (BETIMOL) 0.5 % ophthalmic solution Place 1 drop into both eyes every morning.      Marland Kitchen tiZANidine (ZANAFLEX) 4 MG tablet Take 4 mg by mouth as needed.  1  . triamcinolone (KENALOG) 0.025 % ointment Apply 1 application topically 2 (two) times daily. 30 g 0   No current facility-administered medications for this visit.      Musculoskeletal: Strength & Muscle Tone: {desc; muscle tone:32375} Gait & Station: {PE GAIT ED KXFG:18299} Patient leans: {Patient Leans:21022755}  Psychiatric Specialty Exam: ROS  There were no vitals taken for this visit.There is no height or weight on file to calculate BMI.  General Appearance: {Appearance:22683}  Eye Contact:  {BHH EYE CONTACT:22684}  Speech:  {Speech:22685}  Volume:  {Volume (PAA):22686}  Mood:  {BHH MOOD:22306}  Affect:  {Affect (PAA):22687}  Thought Process:  {Thought Process (PAA):22688}  Orientation:  {BHH ORIENTATION (PAA):22689}  Thought Content: {Thought Content:22690}   Suicidal Thoughts:  {ST/HT (PAA):22692}  Homicidal Thoughts:  {ST/HT (PAA):22692}  Memory:  {BHH MEMORY:22881}  Judgement:  {Judgement (PAA):22694}  Insight:  {Insight (PAA):22695}  Psychomotor Activity:  {Psychomotor (PAA):22696}  Concentration:  {Concentration:21399}  Recall:  {BHH GOOD/FAIR/POOR:22877}  Fund of Knowledge: {BHH GOOD/FAIR/POOR:22877}  Language: {BHH GOOD/FAIR/POOR:22877}  Akathisia:  {BHH YES OR NO:22294}  Handed:  {Handed:22697}  AIMS (if indicated): {Desc; done/not:10129}   Assets:  {Assets (PAA):22698}  ADL's:  {BHH BZJ'I:96789}  Cognition: {chl bhh cognition:304700322}  Sleep:  {BHH GOOD/FAIR/POOR:22877}   Screenings: GAD-7     Office Visit from 07/26/2018 in [facility] Office Visit from 04/24/2018 in [facility] Office Visit from 03/16/2018 in [facility] Office Visit from 12/21/2017 in [facility] Office Visit from 10/27/2017 in [facility]  Total GAD-7 Score  17  20  20   0  18    PHQ2-9     Office Visit from 07/26/2018 in [facility] Office Visit from 04/24/2018 in [facility] Patient Outreach Telephone from 04/20/2018 in [place] Visit from 03/16/2018 in [facility] Office Visit from 02/06/2018 in [facility]  PHQ-2 Total Score  2  6  5  4  2   PHQ-9 Total Score  14  22  22  19  10        Assessment and Plan: ***   Ursula Alert, MD 09/14/2018, 11:32 AM

## 2018-09-18 ENCOUNTER — Ambulatory Visit: Payer: PPO | Admitting: Licensed Clinical Social Worker

## 2018-09-18 ENCOUNTER — Other Ambulatory Visit: Payer: Self-pay

## 2018-09-18 DIAGNOSIS — F4310 Post-traumatic stress disorder, unspecified: Secondary | ICD-10-CM

## 2018-09-20 DIAGNOSIS — L0232 Furuncle of buttock: Secondary | ICD-10-CM | POA: Diagnosis not present

## 2018-09-20 DIAGNOSIS — J449 Chronic obstructive pulmonary disease, unspecified: Secondary | ICD-10-CM | POA: Diagnosis not present

## 2018-09-20 NOTE — Telephone Encounter (Signed)
can you contact patient to see if she can r/s appt

## 2018-09-22 NOTE — Telephone Encounter (Signed)
Pt has appt on  09-27-18.

## 2018-09-27 ENCOUNTER — Encounter: Payer: Self-pay | Admitting: Psychiatry

## 2018-09-27 ENCOUNTER — Other Ambulatory Visit: Payer: Self-pay

## 2018-09-27 ENCOUNTER — Ambulatory Visit (INDEPENDENT_AMBULATORY_CARE_PROVIDER_SITE_OTHER): Payer: PPO | Admitting: Psychiatry

## 2018-09-27 DIAGNOSIS — F4310 Post-traumatic stress disorder, unspecified: Secondary | ICD-10-CM | POA: Diagnosis not present

## 2018-09-27 DIAGNOSIS — F331 Major depressive disorder, recurrent, moderate: Secondary | ICD-10-CM

## 2018-09-27 DIAGNOSIS — F17200 Nicotine dependence, unspecified, uncomplicated: Secondary | ICD-10-CM | POA: Diagnosis not present

## 2018-09-27 DIAGNOSIS — G4701 Insomnia due to medical condition: Secondary | ICD-10-CM

## 2018-09-27 DIAGNOSIS — F1220 Cannabis dependence, uncomplicated: Secondary | ICD-10-CM | POA: Diagnosis not present

## 2018-09-27 NOTE — Progress Notes (Signed)
TC on  09-27-18 @ 10:24 pt medication, pharmacy and allergies were reviewed with no changes.  Pt medical and surgical hx reviewed with no changes.  No vital were taken at this time because this is a phone visit.

## 2018-09-27 NOTE — Progress Notes (Signed)
Virtual Visit via Telephone Note  I connected with Janice Wilson on 09/27/18 at 11:15 AM EDT by telephone and verified that I am speaking with the correct person using two identifiers.   I discussed the limitations, risks, security and privacy concerns of performing an evaluation and management service by telephone and the availability of in person appointments. I also discussed with the patient that there may be a patient responsible charge related to this service. The patient expressed understanding and agreed to proceed.    I discussed the assessment and treatment plan with the patient. The patient was provided an opportunity to ask questions and all were answered. The patient agreed with the plan and demonstrated an understanding of the instructions.   The patient was advised to call back or seek an in-person evaluation if the symptoms worsen or if the condition fails to improve as anticipated.  I provided 15 minutes of non-face-to-face time during this encounter.   Ursula Alert, MD  [facility] MD  OP Progress Note  09/27/2018 5:05 PM Janice Wilson  MRN:  [id number]  Chief Complaint:  Chief Complaint    Follow-up     HPI: Janice Wilson is a 63 year old African-American female, divorced, on SSD, lives in [place], has a history of depression, PTSD, insomnia cannabis and tobacco use disorder, was evaluated by telemedicine today.  Patient referred to do telephone consult today.  Patient reports she has been compliant with her medications as recommended.  She has been taking her Zoloft and Depakote daily.  She reports she has noticed her mood symptoms is improved.  She denies any significant irritability.  She reports sleep is improving.  She is currently working on weaning herself off of the cannabis and smoking cigarettes.  She reports she does not want to take any medications for that and is working on it herself.  Her daughter is supportive.  She reports she had her Depakote level  done on 08/29/2018.  I have reviewed and discussed Depakote labs-therapeutic.  Patient reports she is trying to stay indoors more due to the coronavirus outbreak.  So far she is doing okay.  She is mildly anxious however has been coping okay.  She denies any other concerns today Visit Diagnosis:    ICD-10-CM   1. PTSD (post-traumatic stress disorder) F43.10   2. MDD (major depressive disorder), recurrent episode, moderate (HCC) F33.1   3. Insomnia due to medical condition G47.01   4. Cannabis use disorder, moderate, dependence (HCC) F12.20   5. Tobacco use disorder F17.200     Past Psychiatric History: Reviewed past psychiatric history from my progress note on 04/10/2018.  Past trials of Zoloft, lorazepam  Past Medical History:  Past Medical History:  Diagnosis Date  . Anxiety   . Asthma   . Back pain   . Dental crown present    dental implants - top, front  . Depression   . GERD (gastroesophageal reflux disease)   . Glaucoma   . Headache(784.0)   . Heart murmur    followed by PCP  . Hypertension   . Neck pain    s/p fusion.  limited side-to-side motion.  No limits up and down motion.  . Neck pain   . Panic attack   . Shortness of breath   . Sleep apnea    uses CPAP machine sleep study 9/ 2012 done at sleep med in [place]  . Vertigo    no episodes in over 10 yrs  . Wears dentures  partial upper  . Wears hearing aid    left ear    Past Surgical History:  Procedure Laterality Date  . ANTERIOR CERVICAL DECOMP/DISCECTOMY FUSION  07/05/2011   Procedure: ANTERIOR CERVICAL DECOMPRESSION/DISCECTOMY FUSION 3 LEVELS;  Surgeon: Cooper Render Pool;  Location: [place] [facility];  Service: Neurosurgery;  Laterality: N/A;  Cervical Four-Five, Cervical Five-Six, Cervical Six-Seven Anterior Cervical Decompression Fusion WITH ALLOGRAFT AND PLATING  . CESAREAN SECTION    . COLONOSCOPY WITH PROPOFOL N/A 04/16/2016   Procedure: COLONOSCOPY WITH PROPOFOL;  Surgeon: Lucilla Lame, MD;  Location:  [place];  Service: Endoscopy;  Laterality: N/A;  sleep apnea LATEX sensitivity  . ESOPHAGOGASTRODUODENOSCOPY (EGD) WITH PROPOFOL N/A 01/12/2017   Procedure: ESOPHAGOGASTRODUODENOSCOPY (EGD) WITH PROPOFOL;  Surgeon: Jonathon Bellows, MD;  Location: [place];  Service: Endoscopy;  Laterality: N/A;  Latex sensitivity sleep apnea  . PARTIAL HYSTERECTOMY      Family Psychiatric History: Reviewed family psychiatric history from my progress note on 04/10/2018.  Family History:  Family History  Problem Relation Age of Onset  . Cancer Mother        colon  . Stroke Father   . Drug abuse Sister   . Alcohol abuse Sister   . Anesthesia problems Neg Hx   . Breast cancer Neg Hx     Social History: Reviewed social history from my progress note on 04/10/2018. Social History   Socioeconomic History  . Marital status: Divorced    Spouse name: Not on file  . Number of children: 1  . Years of education: Not on file  . Highest education level: Associate degree: academic program  Occupational History  . Occupation: Disabled  Social Needs  . Financial resource strain: Very hard  . Food insecurity:    Worry: Often true    Inability: Often true  . Transportation needs:    Medical: Yes    Non-medical: Yes  Tobacco Use  . Smoking status: Current Every Day Smoker    Packs/day: 0.25    Years: 25.00    Pack years: 6.25    Types: Cigarettes  . Smokeless tobacco: Never Used  Substance and Sexual Activity  . Alcohol use: Yes    Alcohol/week: 0.0 standard drinks    Comment: 1-2 beer a month(Holidays)  . Drug use: Yes    Types: Marijuana    Comment: daily  . Sexual activity: Not Currently    Partners: Female, Female    Birth control/protection: Condom  Lifestyle  . Physical activity:    Days per week: 7 days    Minutes per session: 30 min  . Stress: To some extent  Relationships  . Social connections:    Talks on phone: Patient refused    Gets together: Patient refused     Attends religious service: 1 to 4 times per year    Active member of club or organization: No    Attends meetings of clubs or organizations: Never    Relationship status: Divorced  Other Topics Concern  . Not on file  Social History Narrative  . Not on file    Allergies:  Allergies  Allergen Reactions  . Latex Rash    Irritation  (Condoms and gloves)    Metabolic Disorder Labs: Lab Results  Component Value Date   HGBA1C 6.4 (H) 07/26/2018   MPG 136.98 07/26/2018   MPG 126 02/06/2018   No results found for: PROLACTIN Lab Results  Component Value Date   CHOL 147 10/27/2017   TRIG 100  10/27/2017   HDL 44 (L) 10/27/2017   CHOLHDL 3.3 10/27/2017   VLDL 17 02/14/2017   LDLCALC 83 10/27/2017   LDLCALC 173 (H) 02/14/2017   Lab Results  Component Value Date   TSH 0.70 08/10/2016   TSH 0.34 (L) 09/14/2013    Therapeutic Level Labs: No results found for: LITHIUM Lab Results  Component Value Date   VALPROATE 83 08/29/2018   VALPROATE 30 (L) 05/05/2018   No components found for:  CBMZ  Current Medications: Current Outpatient Medications  Medication Sig Dispense Refill  . acetaminophen (TYLENOL) 325 MG tablet Take 325 mg by mouth every 6 (six) hours as needed. For pain     . albuterol (PROVENTIL HFA;VENTOLIN HFA) 108 (90 BASE) MCG/ACT inhaler Inhale 1 puff into the lungs every 4 (four) hours as needed. For shortness of breath    . cetirizine (ZYRTEC) 10 MG tablet Take 1 tablet (10 mg total) by mouth daily. 90 tablet 3  . clindamycin (CLEOCIN T) 1 % lotion     . conjugated estrogens (PREMARIN) vaginal cream Place vaginally.    . divalproex (DEPAKOTE ER) 500 MG 24 hr tablet Take 2 tablets (1,000 mg total) by mouth at bedtime. 180 tablet 0  . fluticasone (FLONASE) 50 MCG/ACT nasal spray Place 2 sprays into both nostrils daily. FOR CONGESTION 48 g 1  . Fluticasone-Salmeterol (ADVAIR) 250-50 MCG/DOSE AEPB Inhale 1 puff into the lungs every 12 (twelve) hours as needed. FOR  WHEEZING 180 each 1  . gabapentin (NEURONTIN) 400 MG capsule TAKE 1 CAPSULE (400 MG TOTAL) BY MOUTH 3 (THREE) TIMES DAILY  1  . hydrOXYzine (VISTARIL) 25 MG capsule TAKE 1-2 CAPSULES AS DIRECTED. TAKE 25 MG DAILY AS NEEDED FOR ANXIETY AND 50 MG AT BEDTIME FOR SLEEP 270 capsule 0  . latanoprost (XALATAN) 0.005 % ophthalmic solution Apply 1 drop to eye at bedtime.    Marland Kitchen latanoprost (XALATAN) 0.005 % ophthalmic solution Apply to eye.    Marland Kitchen lisinopril-hydrochlorothiazide (PRINZIDE,ZESTORETIC) 10-12.5 MG tablet Take 1 tablet by mouth daily. 90 tablet 0  . meloxicam (MOBIC) 15 MG tablet Take 1 tablet (15 mg total) by mouth daily. 30 tablet 0  . montelukast (SINGULAIR) 10 MG tablet Take 1 tablet (10 mg total) by mouth at bedtime. 90 tablet 1  . Multiple Vitamins-Minerals (MULTIPLE VITAMINS/WOMENS PO) Take 1 tablet by mouth daily.    Marland Kitchen omeprazole (PRILOSEC) 40 MG capsule Take 1 capsule (40 mg total) by mouth daily. 90 capsule 0  . rosuvastatin (CRESTOR) 20 MG tablet Take 1 tablet (20 mg total) by mouth daily. 90 tablet 1  . sertraline (ZOLOFT) 100 MG tablet Take 1 tablet (100 mg total) by mouth daily. 90 tablet 0  . timolol (BETIMOL) 0.5 % ophthalmic solution Place 1 drop into both eyes every morning.      Marland Kitchen tiZANidine (ZANAFLEX) 4 MG tablet Take 4 mg by mouth as needed.  1  . triamcinolone (KENALOG) 0.025 % ointment Apply 1 application topically 2 (two) times daily. 30 g 0   No current facility-administered medications for this visit.      Musculoskeletal: Strength & Muscle Tone: UTA Gait & Station: UTA Patient leans: N/A  Psychiatric Specialty Exam: Review of Systems  Psychiatric/Behavioral: The patient is nervous/anxious.   All other systems reviewed and are negative.   There were no vitals taken for this visit.There is no height or weight on file to calculate BMI.  General Appearance: UTA  Eye Contact:  UTA  Speech:  Clear and  Coherent  Volume:  Decreased  Mood:  Anxious  Affect:  UTA   Thought Process:  Goal Directed and Descriptions of Associations: Intact  Orientation:  Full (Time, Place, and Person)  Thought Content: Logical   Suicidal Thoughts:  No  Homicidal Thoughts:  No  Memory:  Immediate;   Fair Recent;   Fair Remote;   Fair  Judgement:  Fair  Insight:  Fair  Psychomotor Activity:  UTA  Concentration:  Concentration: Fair and Attention Span: Fair  Recall:  [employer] of Knowledge: Fair  Language: Fair  Akathisia:  No  Handed:  Right  AIMS (if indicated): denies tremors, rigidity,stiffness  Assets:  Communication Skills Desire for Improvement Social Support  ADL's:  Intact  Cognition: WNL  Sleep:  Fair   Screenings: GAD-7     [facility] Visit from 07/26/2018 in [facility] Office Visit from 04/24/2018 in [facility] Office Visit from 03/16/2018 in [facility] Office Visit from 12/21/2017 in [facility] Office Visit from 10/27/2017 in [facility]  Total GAD-7 Score  17  20  20   0  18    PHQ2-9     Office Visit from 07/26/2018 in [facility] Office Visit from 04/24/2018 in [facility] Patient Outreach Telephone from 04/20/2018 in [place] Visit from 03/16/2018 in [facility] Office Visit from 02/06/2018 in [facility]  PHQ-2 Total Score  2  6  5  4  2   PHQ-9 Total Score  14  22  22  19  10        Assessment and Plan: Yaeko is a 63 year old African-American female, on disability, lives in [place], has a history of PTSD, depression, cannabis and tobacco use disorder, chronic pain, hypertension was evaluated by telephone consult today.  Patient is biologically predisposed given her history of trauma as well as substance abuse problems, chronic pain.  Patient also has psychosocial stressors of her own health issues, relationship struggles with her daughter.  Patient  will benefit from continued medication management, will continue plan as noted below.  Plan MDD-improving Zoloft 100 mg p.o. daily Depakote ER 1000 mg p.o. nightly Depakote labs on 08/29/2018 20-83-therapeutic.  Reviewed and discussed it with patient.  PTSD-improving Patient to continue psychotherapy sessions. Continue Zoloft as prescribed.  For insomnia-improving Hydroxyzine 50 mg as needed.  Cannabis use disorder-improving Patient is currently working on tapering it off.  For tobacco use disorder-improving She is currently working on weaning it off.  Follow-up in clinic in 6 weeks or sooner if needed.  I have spent atleast 15 minutes non face to face with patient today. More than 50 % of the time was spent for psychoeducation and supportive psychotherapy and care coordination.  This note was generated in part or whole with voice recognition software. Voice recognition is usually quite accurate but there are transcription errors that can and very often do occur. I apologize for any typographical errors that were not detected and corrected.        Ursula Alert, MD 09/27/2018, 5:05 PM

## 2018-10-03 ENCOUNTER — Other Ambulatory Visit: Payer: Self-pay | Admitting: Family Medicine

## 2018-10-10 DIAGNOSIS — N200 Calculus of kidney: Secondary | ICD-10-CM | POA: Diagnosis not present

## 2018-10-10 DIAGNOSIS — G4733 Obstructive sleep apnea (adult) (pediatric): Secondary | ICD-10-CM | POA: Diagnosis not present

## 2018-10-10 DIAGNOSIS — N281 Cyst of kidney, acquired: Secondary | ICD-10-CM | POA: Diagnosis not present

## 2018-10-11 DIAGNOSIS — R4189 Other symptoms and signs involving cognitive functions and awareness: Secondary | ICD-10-CM | POA: Diagnosis not present

## 2018-10-14 ENCOUNTER — Encounter: Payer: Self-pay | Admitting: Family Medicine

## 2018-10-16 DIAGNOSIS — N200 Calculus of kidney: Secondary | ICD-10-CM | POA: Insufficient documentation

## 2018-10-16 DIAGNOSIS — N281 Cyst of kidney, acquired: Secondary | ICD-10-CM | POA: Insufficient documentation

## 2018-10-17 ENCOUNTER — Encounter: Payer: Self-pay | Admitting: Family Medicine

## 2018-10-17 ENCOUNTER — Other Ambulatory Visit: Payer: Self-pay | Admitting: Family Medicine

## 2018-10-17 DIAGNOSIS — I10 Essential (primary) hypertension: Secondary | ICD-10-CM

## 2018-10-17 DIAGNOSIS — F4310 Post-traumatic stress disorder, unspecified: Secondary | ICD-10-CM

## 2018-10-17 NOTE — Telephone Encounter (Signed)
Refill request for Hypertension medication:  Lisinopril-HCTZ 10-12.5 mg  Depakote ER 500 mg   Last office visit pertaining to hypertension: 07/26/2018  BP Readings from Last 3 Encounters:  07/26/18 (!) 150/70  04/24/18 130/74  04/10/18 138/76     Lab Results  Component Value Date   CREATININE 0.71 07/26/2018   BUN 10 07/26/2018   NA 139 07/26/2018   K 3.6 07/26/2018   CL 108 07/26/2018   CO2 27 07/26/2018     No follow-ups on file.

## 2018-10-23 ENCOUNTER — Telehealth: Payer: Self-pay | Admitting: Family Medicine

## 2018-10-23 NOTE — Telephone Encounter (Signed)
Copied from [place] [phone]. Topic: Quick Communication - See Telephone Encounter >> Oct 23, 2018  4:33 PM R[place] Nail, [place] wrote: CRM for notification. See Telephone encounter for: 10/23/18. Patient calling and states that she would like a call back regarding her lisinopril 5mg . States that she cannot take the lisinopril hctz. Would like to switch back to just lisinopril 5mg . If approved, would like it sent [facility] [place], [place]. Also states that she would like to discuss getting another referral to neurology for her right knee pain. Also, inquiring on results being faxed from [facility] to Dr Ancil Boozer. Please advise.  CB#: [phone] [id number] - [place], [place] - [address]

## 2018-10-24 NOTE — Telephone Encounter (Signed)
Lvm to call and schedule appt about medication issue

## 2018-10-25 ENCOUNTER — Ambulatory Visit (INDEPENDENT_AMBULATORY_CARE_PROVIDER_SITE_OTHER): Payer: PPO | Admitting: Family Medicine

## 2018-10-25 ENCOUNTER — Other Ambulatory Visit: Payer: Self-pay

## 2018-10-25 ENCOUNTER — Encounter: Payer: Self-pay | Admitting: Family Medicine

## 2018-10-25 VITALS — BP 138/80 | HR 99 | Temp 98.2°F | Ht 65.0 in | Wt 167.0 lb

## 2018-10-25 DIAGNOSIS — N200 Calculus of kidney: Secondary | ICD-10-CM | POA: Diagnosis not present

## 2018-10-25 DIAGNOSIS — I10 Essential (primary) hypertension: Secondary | ICD-10-CM

## 2018-10-25 DIAGNOSIS — G3184 Mild cognitive impairment, so stated: Secondary | ICD-10-CM

## 2018-10-25 DIAGNOSIS — F331 Major depressive disorder, recurrent, moderate: Secondary | ICD-10-CM | POA: Diagnosis not present

## 2018-10-25 MED ORDER — LISINOPRIL 10 MG PO TABS: 10.0000 mg | ORAL_TABLET | Freq: Every day | ORAL | 0 refills | Status: DC
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
## 2018-11-03 ENCOUNTER — Other Ambulatory Visit: Payer: Self-pay | Admitting: Family Medicine

## 2018-11-03 NOTE — Progress Notes (Signed)
   THERAPIST PROGRESS NOTE  Session Time: 100mn  Participation Level: Active  Behavioral Response: CasualAlertDepressed  Type of Therapy: Individual Therapy  Treatment Goals addressed: Coping  Interventions: CBT, Motivational Interviewing and Solution Focused  Summary: Janice ESQUILINis a 63y.o. female who presents with continued symptoms of diagnosis.  Therapist met with Patient in an initial therapy session to assess current mood and to build rapport. Therapist engaged Patient in discussion about her life and what is going well for her. Therapist provided support for Patient as she shared details about her life, her current stressors, mood, coping skills, her past, and her children. Therapist prompted Patient to discuss her support system and ways that she manages her daily stress, anger, and frustrations.   LCSW discussed what psychotherapy is and is not and the importance of the therapeutic relationship to include open and honest communication between client and therapist and building trust.  Reviewed advantages and disadvantages of the therapeutic process and limitations to the therapeutic relationship including LCSW's role in maintaining the safety of the client, others and those in client's care.    Suicidal/Homicidal: No  Plan: Return again in 2 weeks.  Diagnosis: Axis I: Post Traumatic Stress Disorder    Axis II: No diagnosis    NLubertha South LCSW 09/18/2018

## 2018-11-06 ENCOUNTER — Other Ambulatory Visit: Payer: Self-pay | Admitting: Family Medicine

## 2018-11-06 ENCOUNTER — Other Ambulatory Visit: Payer: Self-pay | Admitting: Neurology

## 2018-11-06 ENCOUNTER — Ambulatory Visit: Payer: PPO | Attending: Neurology | Admitting: Speech Pathology

## 2018-11-06 DIAGNOSIS — F4310 Post-traumatic stress disorder, unspecified: Secondary | ICD-10-CM

## 2018-11-06 DIAGNOSIS — R4189 Other symptoms and signs involving cognitive functions and awareness: Secondary | ICD-10-CM

## 2018-11-06 DIAGNOSIS — F331 Major depressive disorder, recurrent, moderate: Secondary | ICD-10-CM

## 2018-11-07 NOTE — Telephone Encounter (Signed)
Refill request for general medication. Zoloft to [facility]   Last office visit 10/25/2018   Follow up on 11/23/2018

## 2018-11-08 ENCOUNTER — Ambulatory Visit: Payer: PPO | Admitting: Psychiatry

## 2018-11-08 ENCOUNTER — Ambulatory Visit (INDEPENDENT_AMBULATORY_CARE_PROVIDER_SITE_OTHER): Payer: PPO | Admitting: Psychiatry

## 2018-11-08 ENCOUNTER — Other Ambulatory Visit: Payer: Self-pay

## 2018-11-08 ENCOUNTER — Encounter: Payer: Self-pay | Admitting: Psychiatry

## 2018-11-08 DIAGNOSIS — F1220 Cannabis dependence, uncomplicated: Secondary | ICD-10-CM | POA: Diagnosis not present

## 2018-11-08 DIAGNOSIS — G4701 Insomnia due to medical condition: Secondary | ICD-10-CM | POA: Diagnosis not present

## 2018-11-08 DIAGNOSIS — F17200 Nicotine dependence, unspecified, uncomplicated: Secondary | ICD-10-CM | POA: Diagnosis not present

## 2018-11-08 DIAGNOSIS — F4310 Post-traumatic stress disorder, unspecified: Secondary | ICD-10-CM | POA: Diagnosis not present

## 2018-11-08 DIAGNOSIS — F331 Major depressive disorder, recurrent, moderate: Secondary | ICD-10-CM

## 2018-11-08 MED ORDER — HYDROXYZINE PAMOATE 25 MG PO CAPS: ORAL_CAPSULE | ORAL | 0 refills | Status: DC

## 2018-11-08 MED ORDER — DIVALPROEX SODIUM ER 500 MG PO TB24: 1000.0000 mg | ORAL_TABLET | Freq: Every day | ORAL | 0 refills | Status: DC
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
## 2018-11-12 ENCOUNTER — Other Ambulatory Visit: Payer: Self-pay | Admitting: Psychiatry

## 2018-11-12 DIAGNOSIS — F331 Major depressive disorder, recurrent, moderate: Secondary | ICD-10-CM

## 2018-11-12 DIAGNOSIS — F4310 Post-traumatic stress disorder, unspecified: Secondary | ICD-10-CM

## 2018-11-14 ENCOUNTER — Other Ambulatory Visit: Payer: Self-pay | Admitting: Family Medicine

## 2018-11-14 DIAGNOSIS — K219 Gastro-esophageal reflux disease without esophagitis: Secondary | ICD-10-CM

## 2018-11-16 ENCOUNTER — Other Ambulatory Visit: Payer: Self-pay | Admitting: Family Medicine

## 2018-11-16 ENCOUNTER — Ambulatory Visit: Payer: PPO

## 2018-11-16 DIAGNOSIS — I10 Essential (primary) hypertension: Secondary | ICD-10-CM

## 2018-11-17 ENCOUNTER — Ambulatory Visit (INDEPENDENT_AMBULATORY_CARE_PROVIDER_SITE_OTHER): Payer: PPO | Admitting: Licensed Clinical Social Worker

## 2018-11-17 ENCOUNTER — Other Ambulatory Visit: Payer: Self-pay

## 2018-11-17 DIAGNOSIS — F4310 Post-traumatic stress disorder, unspecified: Secondary | ICD-10-CM | POA: Diagnosis not present

## 2018-11-17 DIAGNOSIS — F331 Major depressive disorder, recurrent, moderate: Secondary | ICD-10-CM

## 2018-11-18 ENCOUNTER — Ambulatory Visit
Admission: RE | Admit: 2018-11-18 | Discharge: 2018-11-18 | Disposition: A | Discharge status: Home / Self Care | Payer: PPO | Source: Ambulatory Visit | Attending: Neurology | Admitting: Neurology

## 2018-11-18 DIAGNOSIS — R410 Disorientation, unspecified: Secondary | ICD-10-CM | POA: Diagnosis not present

## 2018-11-18 DIAGNOSIS — R4189 Other symptoms and signs involving cognitive functions and awareness: Secondary | ICD-10-CM | POA: Insufficient documentation

## 2018-11-18 DIAGNOSIS — R413 Other amnesia: Secondary | ICD-10-CM | POA: Diagnosis not present

## 2018-11-18 DIAGNOSIS — R4781 Slurred speech: Secondary | ICD-10-CM | POA: Diagnosis not present

## 2018-11-18 LAB — POCT I-STAT CREATININE: Creatinine, Ser: 0.8 mg/dL (ref 0.44–1.00)

## 2018-11-18 MED ORDER — GADOBUTROL 1 MMOL/ML IV SOLN
7.0000 mL | Freq: Once | INTRAVENOUS | Status: AC | PRN
  Administered 2018-11-18: 7 mL via INTRAVENOUS

## 2018-11-22 ENCOUNTER — Telehealth: Payer: Self-pay | Admitting: Family Medicine

## 2018-11-22 ENCOUNTER — Ambulatory Visit: Payer: Medicare Other | Admitting: Speech Pathology

## 2018-11-22 ENCOUNTER — Ambulatory Visit: Payer: Self-pay | Admitting: Family Medicine

## 2018-11-22 NOTE — Progress Notes (Signed)
Virtual Visit via Video Note  I connected with Janice Wilson on 11/17/18 at 11:00 AM EDT by  telemedicine application and verified that I am speaking with the correct person using two identifiers.  Location: Patient: home Provider: office   I discussed the limitations of evaluation and management by telemedicine and the availability of in person appointments. The patient expressed understanding and agreed to proceed.     Participation Level: Active  Type of Therapy: Individual Therapy  Treatment Goals addressed: Anxiety and Coping  Interventions: CBT and Motivational Interviewing  Summary: Janice Wilson is a 63 y.o. female who presents with continued symptoms of diagnosis.  Therapist provided active listening for Patient as she communicated her current stressors (arguing with family).  Therapist explained the importance of establishing emotional support as well as ensuring that Patient is engaging in adequate self-care during these stress induced times. Therapist then assisted Patient with brainstorming to determine a solution for her current work problem. Therapist and Patient discussed possible financial supports, emotional supports, family, friends, etc.    Suicidal/Homicidal: No  Plan: Return again in 2 weeks.  Diagnosis: Axis I: Post Traumatic Stress Disorder    Axis II: No diagnosis    I discussed the assessment and treatment plan with the patient. The patient was provided an opportunity to ask questions and all were answered. The patient agreed with the plan and demonstrated an understanding of the instructions.   The patient was advised to call back or seek an in-person evaluation if the symptoms worsen or if the condition fails to improve as anticipated.  I provided 60 minutes of non-face-to-face time during this encounter.   Lubertha South, LCSW

## 2018-11-22 NOTE — Chronic Care Management (AMB) (Signed)
Chronic Care Management   Note  11/22/2018 Name: Janice Wilson MRN: [id number] DOB: 1955/08/21  Janice Wilson is a 63 y.o. year old female who is a primary care patient of Steele Sizer, MD. I reached out to Naoma Diener by phone today in response to a referral sent by Ms. Clearnce [facility] health plan.    Ms. Koltz was given information about Chronic Care Management services today including:  1. CCM service includes personalized support from designated clinical staff supervised by her physician, including individualized plan of care and coordination with other care providers 2. 24/7 contact phone numbers for assistance for urgent and routine care needs. 3. Service will only be billed when office clinical staff spend 20 minutes or more in a month to coordinate care. 4. Only one practitioner may furnish and bill the service in a calendar month. 5. The patient may stop CCM services at any time (effective at the end of the month) by phone call to the office staff. 6. The patient will be responsible for cost sharing (co-pay) of up to 20% of the service fee (after annual deductible is met).  Patient agreed to services and verbal consent obtained.   Follow up plan: Telephone appointment with CCM team member scheduled for: 11/23/2018  [place]  [email or web address]   ??[phone]

## 2018-11-22 NOTE — Telephone Encounter (Signed)
@  [facility] Chronic Care Management   Outreach Note  11/22/2018 Name: Janice Wilson MRN: [id number] DOB: 06/05/1956  Referred by: Steele Sizer, MD Reason for referral : Chronic Care Management (Initial CCM outreach call was unsuccessful.)   An unsuccessful telephone outreach was attempted today. The patient was referred to the case management team by for assistance with chronic care management and care coordination.   Follow Up Plan: A HIPPA compliant phone message was left for the patient providing contact information and requesting a return call.  The care management team will reach out to the patient again over the next 7 days.  If patient returns call to provider office, please advise to call [place] at [place]  [email or web address]   ??[phone]

## 2018-11-23 ENCOUNTER — Ambulatory Visit (INDEPENDENT_AMBULATORY_CARE_PROVIDER_SITE_OTHER): Payer: PPO | Admitting: *Deleted

## 2018-11-23 ENCOUNTER — Encounter: Payer: Self-pay | Admitting: *Deleted

## 2018-11-23 ENCOUNTER — Ambulatory Visit (INDEPENDENT_AMBULATORY_CARE_PROVIDER_SITE_OTHER): Payer: PPO | Admitting: Family Medicine

## 2018-11-23 ENCOUNTER — Encounter: Payer: Self-pay | Admitting: Family Medicine

## 2018-11-23 VITALS — BP 132/84 | HR 72 | Resp 16 | Ht 65.0 in | Wt 176.0 lb

## 2018-11-23 DIAGNOSIS — F419 Anxiety disorder, unspecified: Secondary | ICD-10-CM

## 2018-11-23 DIAGNOSIS — F32A Depression, unspecified: Secondary | ICD-10-CM

## 2018-11-23 DIAGNOSIS — I10 Essential (primary) hypertension: Secondary | ICD-10-CM

## 2018-11-23 DIAGNOSIS — F331 Major depressive disorder, recurrent, moderate: Secondary | ICD-10-CM | POA: Insufficient documentation

## 2018-11-23 DIAGNOSIS — F329 Major depressive disorder, single episode, unspecified: Secondary | ICD-10-CM

## 2018-11-23 DIAGNOSIS — Z599 Problem related to housing and economic circumstances, unspecified: Secondary | ICD-10-CM

## 2018-11-23 DIAGNOSIS — Z598 Other problems related to housing and economic circumstances: Secondary | ICD-10-CM

## 2018-11-23 NOTE — Patient Instructions (Signed)
Thank you allowing the Chronic Care Management Team to be a part of your care! It was a pleasure speaking with you today!  1. Please call the customer service number on your insurance card for in network local providers 2. Please contact the Department of Social Services to discuss a possible increase in your food stamp allotment [phone]     CCM (Chronic Care Management) Team   Trish Fountain RN, BSN Nurse Care Coordinator  [phone]  Ruben Reason PharmD  Clinical Pharmacist  [phone]   Elliot Gurney, [place] Social Worker [phone]  Goals Addressed            This Visit's Progress   . Patient Stated (pt-stated)       Current Barriers:  . Limited education about identifying local, in network providers * . Lacks knowledge of community resource: Dentist for the local Department of Social Services  [phone]  Clinical Social Work Clinical Goal(s):  Marland Kitchen Over the next 30 days, patient will work with her insurance company to identify local, in network providers and will contact the Department of [place] to request increase in food stamp allotment   Interventions: . Patient interviewed and appropriate assessments performed . Provided patient with information about identifying in network providers and follow up on food stamp allotment . Advised patient to contact the customer service number on her card for in network providers. Patient also advised to contact the Department of Social Services to request an increase in food stamp allotment  Patient Self Care Activities:  . Self administers medications as prescribed . Attends all scheduled provider appointments . Performs ADL's independently . Performs IADL's independently . Calls provider office for new concerns or questions  Initial goal documentation         The patient verbalized understanding of instructions provided today and declined a print copy of patient instruction  materials.   No further follow up required: patient to call with any questions or concerns in the future

## 2018-11-23 NOTE — Progress Notes (Signed)
Name: Janice Wilson   MRN: [id number]    DOB: 07-24-1955   Date:11/23/2018       Progress Note  Subjective  Chief Complaint  Chief Complaint  Patient presents with  . Follow-up    1 month F/U  . Hypertension    Denies any symptoms  . Depression    I connected with  Naoma Diener  on 11/23/18 at 11:40 AM EDT by a video enabled telemedicine application and verified that I am speaking with the correct person using two identifiers.  I discussed the limitations of evaluation and management by telemedicine and the availability of in person appointments. The patient expressed understanding and agreed to proceed. Staff also discussed with the patient that there may be a patient responsible charge related to this service. Patient Location: at home Provider Location: [facility]   HPI  HTN: she states she is feeling much better, bp at home has been well controlled in the 120's-130's, no chest pain or palpitation. She is compliant with medication.   MDD and anxiety :doing very well, standing up for herself, setting boundaries and is making her feel more in control, she has been very pleased with therapist and also Dr. Shea Evans  Patient Active Problem List   Diagnosis Date Noted  . Right renal stone 10/16/2018  . Renal cyst, left 10/16/2018  . Pre-diabetes 07/27/2018  . Cannabis use disorder, moderate, dependence ([place]) 07/26/2018  . TMJ (temporomandibular joint disorder) 02/07/2018  . Chronic angle-closure glaucoma of eye, left, mild stage 08/24/2017  . 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 Dry eye syndrome of both lacrimal glands 08/24/2017  . Age-related nuclear cataract of both eyes 08/24/2017  . Murmur, cardiac 12/16/2016  . Disorder of patellofemoral joint 12/16/2016  . Hidradenitis 12/16/2016  . Speech and language deficits 12/16/2016  . Vitamin D deficiency 12/16/2016  . Hypertension 10/11/2016  . Melanosis of colon   . Hyperlipidemia  09/15/2015  . Overweight (BMI 25.0-29.9) 06/17/2015  . Apnea, sleep 04/15/2015  . Asthma, mild intermittent 04/15/2015  . Cervical disc disease 04/15/2015  . Dysfunction of eustachian tube 04/15/2015  . Compulsive tobacco user syndrome 04/15/2015  . Anxiety and depression 01/02/2015  . Chronic cervical pain 01/02/2015  . Cannot sleep 01/02/2015  . Female cystocele 07/31/2014  . Cervical spinal stenosis 07/05/2011    Past Surgical History:  Procedure Laterality Date  . ANTERIOR CERVICAL DECOMP/DISCECTOMY FUSION  07/05/2011   Procedure: ANTERIOR CERVICAL DECOMPRESSION/DISCECTOMY FUSION 3 LEVELS;  Surgeon: Cooper Render Pool;  Location: [place];  Service: Neurosurgery;  Laterality: N/A;  Cervical Four-Five, Cervical Five-Six, Cervical Six-Seven Anterior Cervical Decompression Fusion WITH ALLOGRAFT AND PLATING  . CESAREAN SECTION    . COLONOSCOPY WITH PROPOFOL N/A 04/16/2016   Procedure: COLONOSCOPY WITH PROPOFOL;  Surgeon: Lucilla Lame, MD;  Location: [facility];  Service: Endoscopy;  Laterality: N/A;  sleep apnea LATEX sensitivity  . ESOPHAGOGASTRODUODENOSCOPY (EGD) WITH PROPOFOL N/A 01/12/2017   Procedure: ESOPHAGOGASTRODUODENOSCOPY (EGD) WITH PROPOFOL;  Surgeon: Jonathon Bellows, MD;  Location: [facility];  Service: Endoscopy;  Laterality: N/A;  Latex sensitivity sleep apnea  . PARTIAL HYSTERECTOMY      Family History  Problem Relation Age of Onset  . Cancer Mother        colon  . Stroke Father   . Drug abuse Sister   . Alcohol abuse Sister   . Anesthesia problems Neg Hx   . Breast cancer Neg Hx  Social History   Socioeconomic History  . Marital status: Divorced    Spouse name: Not on file  . Number of children: 1  . Years of education: Not on file  . Highest education level: Associate degree: academic program  Occupational History  . Occupation: Disabled  Social Needs  . Financial resource strain: Somewhat hard  . Food insecurity:    Worry: Sometimes  true    Inability: Sometimes true  . Transportation needs:    Medical: No    Non-medical: No  Tobacco Use  . Smoking status: Current Every Day Smoker    Packs/day: 0.25    Years: 25.00    Pack years: 6.25    Types: Cigarettes  . Smokeless tobacco: Never Used  Substance and Sexual Activity  . Alcohol use: Yes    Alcohol/week: 0.0 standard drinks    Comment: 1-2 beer a month(Holidays)  . Drug use: Yes    Types: Marijuana    Comment: daily  . Sexual activity: Not Currently    Partners: Female, Female    Birth control/protection: Condom  Lifestyle  . Physical activity:    Days per week: 2 days    Minutes per session: 30 min  . Stress: To some extent  Relationships  . Social connections:    Talks on phone: More than three times a week    Gets together: Never    Attends religious service: Never    Active member of club or organization: No    Attends meetings of clubs or organizations: Never    Relationship status: Divorced  . Intimate partner violence:    Fear of current or ex partner: No    Emotionally abused: No    Physically abused: No    Forced sexual activity: No  Other Topics Concern  . Not on file  Social History Narrative  . Not on file     Current Outpatient Medications:  .  acetaminophen (TYLENOL) 325 MG tablet, Take 325 mg by mouth every 6 (six) hours as needed. For pain , Disp: , Rfl:  .  albuterol (PROVENTIL HFA;VENTOLIN HFA) 108 (90 BASE) MCG/ACT inhaler, Inhale 1 puff into the lungs every 4 (four) hours as needed. For shortness of breath, Disp: , Rfl:  .  amphetamine-dextroamphetamine (ADDERALL XR) 10 MG 24 hr capsule, Take by mouth., Disp: , Rfl:  .  cetirizine (ZYRTEC) 10 MG tablet, Take 1 tablet (10 mg total) by mouth daily., Disp: 90 tablet, Rfl: 3 .  clindamycin (CLEOCIN T) 1 % lotion, , Disp: , Rfl:  .  conjugated estrogens (PREMARIN) vaginal cream, Place vaginally., Disp: , Rfl:  .  divalproex (DEPAKOTE ER) 500 MG 24 hr tablet, Take 2 tablets (1,000  mg total) by mouth at bedtime., Disp: 180 tablet, Rfl: 0 .  fluticasone (FLONASE) 50 MCG/ACT nasal spray, Place 2 sprays into both nostrils daily. FOR CONGESTION, Disp: 48 g, Rfl: 1 .  Fluticasone-Salmeterol (ADVAIR) 250-50 MCG/DOSE AEPB, Inhale 1 puff into the lungs every 12 (twelve) hours as needed. FOR WHEEZING, Disp: 180 each, Rfl: 1 .  gabapentin (NEURONTIN) 400 MG capsule, TAKE 1 CAPSULE (400 MG TOTAL) BY MOUTH 3 (THREE) TIMES DAILY, Disp: , Rfl: 1 .  hydrOXYzine (VISTARIL) 25 MG capsule, TAKE 1 CAPSULE DAILY AS NEEDED FOR ANXIETY AND 2 CAPSULES AT BEDTIME FOR SLEEP, Disp: 270 capsule, Rfl: 0 .  latanoprost (XALATAN) 0.005 % ophthalmic solution, Apply 1 drop to eye at bedtime., Disp: , Rfl:  .  latanoprost (XALATAN) 0.005 %  ophthalmic solution, Apply to eye., Disp: , Rfl:  .  lisinopril (ZESTRIL) 10 MG tablet, TAKE 1-2 TABLETS BY MOUTH DAILY, Disp: 60 tablet, Rfl: 0 .  loratadine (CLARITIN) 10 MG tablet, Take 10 mg by mouth daily., Disp: , Rfl:  .  meloxicam (MOBIC) 15 MG tablet, TAKE 1 TABLET BY MOUTH EVERY DAY, Disp: 30 tablet, Rfl: 0 .  montelukast (SINGULAIR) 10 MG tablet, Take 1 tablet (10 mg total) by mouth at bedtime., Disp: 90 tablet, Rfl: 1 .  Multiple Vitamins-Minerals (MULTIPLE VITAMINS/WOMENS PO), Take 1 tablet by mouth daily., Disp: , Rfl:  .  omeprazole (PRILOSEC) 40 MG capsule, TAKE 1 CAPSULE BY MOUTH EVERY DAY, Disp: 30 capsule, Rfl: 0 .  rosuvastatin (CRESTOR) 20 MG tablet, Take 1 tablet (20 mg total) by mouth daily., Disp: 90 tablet, Rfl: 1 .  sertraline (ZOLOFT) 100 MG tablet, TAKE 1 TABLET BY MOUTH EVERY DAY, Disp: 30 tablet, Rfl: 0 .  timolol (BETIMOL) 0.5 % ophthalmic solution, Place 1 drop into both eyes every morning.  , Disp: , Rfl:  .  tiZANidine (ZANAFLEX) 4 MG tablet, Take 4 mg by mouth as needed., Disp: , Rfl: 1 .  triamcinolone (KENALOG) 0.025 % ointment, Apply 1 application topically 2 (two) times daily., Disp: 30 g, Rfl: 0  Allergies  Allergen Reactions  .  Latex Rash    Irritation  (Condoms and gloves)    I personally reviewed active problem list, medication list, allergies, family history with the patient/caregiver today.   ROS   Ten systems reviewed and is negative except as mentioned in HPI   Objective  Virtual encounter, vitals obtained at home  Body mass index is 29.29 kg/m.  Physical Exam  Awake, alert and oriented and in no distress  PHQ2/9: Depression screen [facility] 2/9 11/23/2018 11/23/2018 10/25/2018 07/26/2018 04/24/2018  Decreased Interest 1 0 0 1 3  Down, Depressed, Hopeless 0 0 0 1 3  PHQ - 2 Score 1 0 0 2 6  Altered sleeping 0 - 0 2 3  Tired, decreased energy 0 - 1 2 3   Change in appetite 0 - 0 1 2  Feeling bad or failure about yourself  0 - 0 1 2  Trouble concentrating 1 - 3 3 3   Moving slowly or fidgety/restless 0 - 3 3 3   Suicidal thoughts 0 - 0 0 0  PHQ-9 Score 2 - 7 14 22   Difficult doing work/chores Not difficult at all - Extremely dIfficult Somewhat difficult Very difficult  Some recent data might be hidden   PHQ-2/9 Result is positive.    Fall Risk: Fall Risk  11/23/2018 11/23/2018 10/25/2018 07/26/2018 04/24/2018  Falls in the past year? 0 0 0 0 0  Number falls in past yr: 0 0 0 - 0  Injury with Fall? 0 0 0 - 0  Risk for fall due to : - - - - -  Risk for fall due to: Comment - - - - -     Assessment & Plan  1. Essential hypertension  bp is at goal, continue medication  2. MDD (major depressive disorder), recurrent episode, moderate ([place])  Doing well, setting boundaries with relatives, still seeing therapist and psychiatrist  I discussed the assessment and treatment plan with the patient. The patient was provided an opportunity to ask questions and all were answered. The patient agreed with the plan and demonstrated an understanding of the instructions.  The patient was advised to call back or seek an in-person evaluation if  the symptoms worsen or if the condition fails to improve as anticipated.  I  provided 15  minutes of non-face-to-face time during this encounter.

## 2018-11-23 NOTE — Chronic Care Management (AMB) (Addendum)
  Chronic Care Management    Clinical Social Work General Follow Up Note  11/23/2018 Name: Janice Wilson MRN: [id number] DOB: May 05, 1956  Janice Wilson is a 63 y.o. year old female who is a primary care patient of Steele Sizer, MD. The CCM team was consulted for assistance with [employer].   Review of patient status, including review of consultants reports, relevant laboratory and other test results, and collaboration with appropriate care team members and the patient's provider was performed as part of comprehensive patient evaluation and provision of chronic care management services.    SDOH (Social Determinants of Health) screening performed today. See Care Plan Entry related to challenges with: Tobacco Use, Stress Concern Present(patient currently followed by [facility]), and Food Insecurity(Patient currently receives food stamps).  Patient discussed moving to [place], [place] approximately 1 month ago and now lives in an independent living residence call [facility]. Per patient, she would like to keep her current primary care provider and psychiatrist in [place] but would need to find local providers for overall care and follow up(dental, eye doctor, specialist etc.) Per patient, she has switched insurance companies and now is covered under [facility]. This Education officer, museum strongly encouraged patient to contact her insurance company using the customer service number on the back of the card for a list of local, in network providers.  This social worker encouraged patient to follow up with her mental health provider. Patient currently receives food stamps but would like information on increasing the amount received. Patient encouraged to call the Department of Social Services in [place] to discuss her food stamp benefit [phone]  This social worker identified no additional community resource needs at this time. This Education officer, museum  provided patient with my contact information to utilize for any questions or concerns in the future.     Goals Addressed            This Visit's Progress   . Patient Stated (pt-stated)       Current Barriers:  . Limited education about identifying local, in network providers * . Lacks knowledge of community resource: Dentist for the local Department of Social Services  [phone]  Clinical Social Work Clinical Goal(s):  Marland Kitchen Over the next 30 days, patient will work with her insurance company to identify local, in network providers and will contact the Department of [place] to request increase in food stamp allotment   Interventions: . Patient interviewed and appropriate assessments performed . Provided patient with information about identifying in network providers and follow up on food stamp allotment . Advised patient to contact the customer service number on her card for in network providers. Patient also advised to contact the Department of Social Services to request an increase in food stamp allotment  Patient Self Care Activities:  . Self administers medications as prescribed . Attends all scheduled provider appointments . Performs ADL's independently . Performs IADL's independently . Calls provider office for new concerns or questions  Initial goal documentation          Follow Up Plan: Client will contact the customer service number on the back of her insurance card for assistance with in-network medical providers  No further follow up needed at this time, however patient provided with this social worker's contact information if needed in the future.    Elliot Gurney, [place] Administrator, [facility] [phone]

## 2018-11-27 ENCOUNTER — Ambulatory Visit: Payer: Medicare Other | Admitting: Speech Pathology

## 2018-11-29 ENCOUNTER — Ambulatory Visit: Payer: Medicare Other | Admitting: Speech Pathology

## 2018-11-30 ENCOUNTER — Other Ambulatory Visit: Payer: Self-pay

## 2018-11-30 DIAGNOSIS — K219 Gastro-esophageal reflux disease without esophagitis: Secondary | ICD-10-CM

## 2018-11-30 MED ORDER — OMEPRAZOLE 40 MG PO CPDR: DELAYED_RELEASE_CAPSULE | ORAL | 5 refills | Status: DC

## 2018-12-03 ENCOUNTER — Encounter: Payer: Self-pay | Admitting: Family Medicine

## 2018-12-05 ENCOUNTER — Ambulatory Visit (INDEPENDENT_AMBULATORY_CARE_PROVIDER_SITE_OTHER): Payer: Medicare Other | Admitting: Psychiatry

## 2018-12-05 ENCOUNTER — Encounter: Payer: Self-pay | Admitting: Psychiatry

## 2018-12-05 ENCOUNTER — Other Ambulatory Visit: Payer: Self-pay

## 2018-12-05 DIAGNOSIS — G4701 Insomnia due to medical condition: Secondary | ICD-10-CM | POA: Diagnosis not present

## 2018-12-05 DIAGNOSIS — F4310 Post-traumatic stress disorder, unspecified: Secondary | ICD-10-CM | POA: Diagnosis not present

## 2018-12-05 DIAGNOSIS — F1220 Cannabis dependence, uncomplicated: Secondary | ICD-10-CM

## 2018-12-05 DIAGNOSIS — F331 Major depressive disorder, recurrent, moderate: Secondary | ICD-10-CM

## 2018-12-05 DIAGNOSIS — F17200 Nicotine dependence, unspecified, uncomplicated: Secondary | ICD-10-CM

## 2018-12-05 MED ORDER — SERTRALINE HCL 100 MG PO TABS: 100.0000 mg | ORAL_TABLET | Freq: Every day | ORAL | 0 refills | Status: DC

## 2018-12-05 NOTE — Progress Notes (Signed)
Virtual Visit via Video Note  I connected with Janice Wilson on 12/05/18 at 11:15 AM EDT by a video enabled telemedicine application and verified that I am speaking with the correct person using two identifiers.   I discussed the limitations of evaluation and management by telemedicine and the availability of in person appointments. The patient expressed understanding and agreed to proceed.   I discussed the assessment and treatment plan with the patient. The patient was provided an opportunity to ask questions and all were answered. The patient agreed with the plan and demonstrated an understanding of the instructions.   The patient was advised to call back or seek an in-person evaluation if the symptoms worsen or if the condition fails to improve as anticipated.  [facility] MD OP Progress Note  12/05/2018 5:44 PM Janice Wilson  MRN:  [id number]  Chief Complaint:  Chief Complaint    Follow-up     HPI: Janice Wilson is a 63 year old African-American female, divorced, on SSD, lives in [place], has a history of PTSD, MDD, cannabis use disorder, insomnia, tobacco use disorder was evaluated by telemedicine today.  Patient today reports she has been making progress on the current medication regimen.  She reports she is compliant on the Zoloft and Depakote.  She denies any significant mood lability or anxiety symptoms at this time.  She reports her neurologist recently started her on Adderall for her cognitive problems.  She reports that has tremendously helped her.  She denies any side effects at this time.  She reports sleep is okay.  She has been cutting back on cannabis.  She continues to work with therapist and reports that is going well.  She reports she was able to cut out some of the family members who were causing her a lot of problems.  She reports that also help.  She denies any suicidality, homicidality or perceptual disturbances.   Visit Diagnosis:    ICD-10-CM   1. PTSD  (post-traumatic stress disorder)  F43.10 sertraline (ZOLOFT) 100 MG tablet  2. MDD (major depressive disorder), recurrent episode, moderate (HCC)  F33.1 sertraline (ZOLOFT) 100 MG tablet  3. Insomnia due to medical condition  G47.01   4. Cannabis use disorder, moderate, dependence (HCC)  F12.20   5. Tobacco use disorder  F17.200     Past Psychiatric History: Reviewed past psychiatric history from my progress note on 04/10/2018.  Past trials of Zoloft, Lorazepam.  Past Medical History:  Past Medical History:  Diagnosis Date  . Anxiety   . Asthma   . Back pain   . Dental crown present    dental implants - top, front  . Depression   . GERD (gastroesophageal reflux disease)   . Glaucoma   . Headache(784.0)   . Heart murmur    followed by PCP  . Hypertension   . Neck pain    s/p fusion.  limited side-to-side motion.  No limits up and down motion.  . Neck pain   . Panic attack   . Shortness of breath   . Sleep apnea    uses CPAP machine sleep study 9/ 2012 done at sleep med in [place]  . Vertigo    no episodes in over 10 yrs  . Wears dentures    partial upper  . Wears hearing aid    left ear    Past Surgical History:  Procedure Laterality Date  . ANTERIOR CERVICAL DECOMP/DISCECTOMY FUSION  07/05/2011   Procedure: ANTERIOR CERVICAL DECOMPRESSION/DISCECTOMY FUSION 3 LEVELS;  Surgeon: Charlie Pitter;  Location: [facility];  Service: Neurosurgery;  Laterality: N/A;  Cervical Four-Five, Cervical Five-Six, Cervical Six-Seven Anterior Cervical Decompression Fusion WITH ALLOGRAFT AND PLATING  . CESAREAN SECTION    . COLONOSCOPY WITH PROPOFOL N/A 04/16/2016   Procedure: COLONOSCOPY WITH PROPOFOL;  Surgeon: Lucilla Lame, MD;  Location: [facility];  Service: Endoscopy;  Laterality: N/A;  sleep apnea LATEX sensitivity  . ESOPHAGOGASTRODUODENOSCOPY (EGD) WITH PROPOFOL N/A 01/12/2017   Procedure: ESOPHAGOGASTRODUODENOSCOPY (EGD) WITH PROPOFOL;  Surgeon: Jonathon Bellows, MD;  Location:  [facility];  Service: Endoscopy;  Laterality: N/A;  Latex sensitivity sleep apnea  . PARTIAL HYSTERECTOMY      Family Psychiatric History: I have reviewed family psychiatric history from my progress note on 04/10/2018.  Family History:  Family History  Problem Relation Age of Onset  . Cancer Mother        colon  . Stroke Father   . Drug abuse Sister   . Alcohol abuse Sister   . Anesthesia problems Neg Hx   . Breast cancer Neg Hx     Social History: Reviewed social history from my progress note on 04/10/2018. Social History   Socioeconomic History  . Marital status: Divorced    Spouse name: Not on file  . Number of children: 1  . Years of education: Not on file  . Highest education level: Associate degree: academic program  Occupational History  . Occupation: Disabled  Social Needs  . Financial resource strain: Somewhat hard  . Food insecurity    Worry: Sometimes true    Inability: Sometimes true  . Transportation needs    Medical: No    Non-medical: No  Tobacco Use  . Smoking status: Current Every Day Smoker    Packs/day: 0.25    Years: 25.00    Pack years: 6.25    Types: Cigarettes  . Smokeless tobacco: Never Used  Substance and Sexual Activity  . Alcohol use: Yes    Alcohol/week: 0.0 standard drinks    Comment: 1-2 beer a month(Holidays)  . Drug use: Yes    Types: Marijuana    Comment: daily  . Sexual activity: Not Currently    Partners: Female, Female    Birth control/protection: Condom  Lifestyle  . Physical activity    Days per week: 2 days    Minutes per session: 30 min  . Stress: To some extent  Relationships  . Social Herbalist on phone: More than three times a week    Gets together: Never    Attends religious service: Never    Active member of club or organization: No    Attends meetings of clubs or organizations: Never    Relationship status: Divorced  Other Topics Concern  . Not on file  Social History Narrative    Moved to [place]    Allergies:  Allergies  Allergen Reactions  . Latex Rash    Irritation  (Condoms and gloves)    Metabolic Disorder Labs: Lab Results  Component Value Date   HGBA1C 6.4 (H) 07/26/2018   MPG 136.98 07/26/2018   MPG 126 02/06/2018   No results found for: PROLACTIN Lab Results  Component Value Date   CHOL 147 10/27/2017   TRIG 100 10/27/2017   HDL 44 (L) 10/27/2017   CHOLHDL 3.3 10/27/2017   VLDL 17 02/14/2017   LDLCALC 83 10/27/2017   LDLCALC 173 (H) 02/14/2017   Lab Results  Component Value Date   TSH 0.70  08/10/2016   TSH 0.34 (L) 09/14/2013    Therapeutic Level Labs: No results found for: LITHIUM Lab Results  Component Value Date   VALPROATE 83 08/29/2018   VALPROATE 30 (L) 05/05/2018   No components found for:  CBMZ  Current Medications: Current Outpatient Medications  Medication Sig Dispense Refill  . amphetamine-dextroamphetamine (ADDERALL XR) 10 MG 24 hr capsule Take by mouth.    Marland Kitchen acetaminophen (TYLENOL) 325 MG tablet Take 325 mg by mouth every 6 (six) hours as needed. For pain     . albuterol (PROVENTIL HFA;VENTOLIN HFA) 108 (90 BASE) MCG/ACT inhaler Inhale 1 puff into the lungs every 4 (four) hours as needed. For shortness of breath    . amphetamine-dextroamphetamine (ADDERALL XR) 10 MG 24 hr capsule Take by mouth.    Derrill Memo ON 01/29/2019] amphetamine-dextroamphetamine (ADDERALL XR) 10 MG 24 hr capsule Take by mouth.    . cetirizine (ZYRTEC) 10 MG tablet Take 1 tablet (10 mg total) by mouth daily. 90 tablet 3  . clindamycin (CLEOCIN T) 1 % lotion     . conjugated estrogens (PREMARIN) vaginal cream Place vaginally.    . divalproex (DEPAKOTE ER) 500 MG 24 hr tablet Take 2 tablets (1,000 mg total) by mouth at bedtime. 180 tablet 0  . fluticasone (FLONASE) 50 MCG/ACT nasal spray Place 2 sprays into both nostrils daily. FOR CONGESTION 48 g 1  . Fluticasone-Salmeterol (ADVAIR) 250-50 MCG/DOSE AEPB Inhale 1 puff into the lungs every 12  (twelve) hours as needed. FOR WHEEZING 180 each 1  . gabapentin (NEURONTIN) 400 MG capsule TAKE 1 CAPSULE (400 MG TOTAL) BY MOUTH 3 (THREE) TIMES DAILY  1  . hydrOXYzine (VISTARIL) 25 MG capsule TAKE 1 CAPSULE DAILY AS NEEDED FOR ANXIETY AND 2 CAPSULES AT BEDTIME FOR SLEEP 270 capsule 0  . latanoprost (XALATAN) 0.005 % ophthalmic solution Apply 1 drop to eye at bedtime.    Marland Kitchen latanoprost (XALATAN) 0.005 % ophthalmic solution Apply to eye.    Marland Kitchen lisinopril (ZESTRIL) 10 MG tablet TAKE 1-2 TABLETS BY MOUTH DAILY 60 tablet 0  . loratadine (CLARITIN) 10 MG tablet Take 10 mg by mouth daily.    . meloxicam (MOBIC) 15 MG tablet TAKE 1 TABLET BY MOUTH EVERY DAY 30 tablet 0  . montelukast (SINGULAIR) 10 MG tablet Take 1 tablet (10 mg total) by mouth at bedtime. 90 tablet 1  . Multiple Vitamins-Minerals (MULTIPLE VITAMINS/WOMENS PO) Take 1 tablet by mouth daily.    Marland Kitchen omeprazole (PRILOSEC) 40 MG capsule TAKE 1 CAPSULE BY MOUTH EVERY DAY 30 capsule 5  . rosuvastatin (CRESTOR) 20 MG tablet Take 1 tablet (20 mg total) by mouth daily. 90 tablet 1  . sertraline (ZOLOFT) 100 MG tablet Take 1 tablet (100 mg total) by mouth daily. 90 tablet 0  . timolol (BETIMOL) 0.5 % ophthalmic solution Place 1 drop into both eyes every morning.      Marland Kitchen tiZANidine (ZANAFLEX) 4 MG tablet Take 4 mg by mouth as needed.  1  . triamcinolone (KENALOG) 0.025 % ointment Apply 1 application topically 2 (two) times daily. 30 g 0   No current facility-administered medications for this visit.      Musculoskeletal: Strength & Muscle Tone: within normal limits Gait & Station: normal Patient leans: N/A  Psychiatric Specialty Exam: Review of Systems  Psychiatric/Behavioral: Positive for depression.  All other systems reviewed and are negative.   There were no vitals taken for this visit.There is no height or weight on file to calculate BMI.  General Appearance: Casual  Eye Contact:  Fair  Speech:  Clear and Coherent  Volume:  Normal   Mood:  Depressed  Affect:  Congruent  Thought Process:  Goal Directed and Descriptions of Associations: Intact  Orientation:  Full (Time, Place, and Person)  Thought Content: Logical   Suicidal Thoughts:  No  Homicidal Thoughts:  No  Memory:  Immediate;   Fair Recent;   Fair Remote;   Fair  Judgement:  Fair  Insight:  Fair  Psychomotor Activity:  Normal  Concentration:  Concentration: Fair and Attention Span: Fair  Recall:  [employer] of Knowledge: Fair  Language: Fair  Akathisia:  No  Handed:  Right  AIMS (if indicated): denies tremors, rigidity  Assets:  Communication Skills Desire for Improvement Housing Social Support  ADL's:  Intact  Cognition: WNL  Sleep:  Fair   Screenings: GAD-7     Office Visit from 07/26/2018 in [facility] Office Visit from 04/24/2018 in [facility] Office Visit from 03/16/2018 in [facility] Office Visit from 12/21/2017 in [facility] Office Visit from 10/27/2017 in [facility]  Total GAD-7 Score  17  20  20   0  18    PHQ2-9     Office Visit from 11/23/2018 in [facility] Most recent reading at 11/23/2018 11:09 AM Chronic Care Management from 11/23/2018 in [facility] Most recent reading at 11/23/2018  9:10 AM Office Visit from 10/25/2018 in [facility] Most recent reading at 10/25/2018  1:25 PM Office Visit from 07/26/2018 in [facility] Most recent reading at 07/26/2018 12:13 PM Office Visit from 04/24/2018 in [facility] Most recent reading at 04/24/2018  2:48 PM  PHQ-2 Total Score  1  0  0  2  6  PHQ-9 Total Score  2  -  7  14  22        Assessment and Plan: Janice Wilson is a 63 year-old African-American female, on disability, lives in [place], has a history of PTSD, MDD, cannabis use disorder, tobacco use disorder, chronic pain, hypertension was evaluated by  telemedicine today.  She is biologically predisposed given her history of trauma as well as substance abuse problems, chronic pain.  Patient also has psychosocial stressors of her own health problems, relationship struggles with her daughter.  She however is currently making progress on the current medication regimen.  Plan as noted below.  Plan MDD-improving Zoloft 100 mg p.o. daily Depakote ER 1000 mg p.o. daily. Depakote level on 08/29/2018 -83-therapeutic  For PTSD-proving Continue Zoloft.  For insomnia-improving Hydroxyzine 50 mg p.o. nightly as needed  For cannabis use disorder- improving She has been cutting back.  For tobacco use disorder-improving Provided smoking cessation counseling.  Patient to continue to work with her therapist Ms. Peacock.  Follow-up in clinic in 1 month or sooner if needed.  August 10 at 10:45 AM  I have spent atleast 15 minutes non face to face with patient today. More than 50 % of the time was spent for psychoeducation and supportive psychotherapy and care coordination.  This note was generated in part or whole with voice recognition software. Voice recognition is usually quite accurate but there are transcription errors that can and very often do occur. I apologize for any typographical errors that were not detected and corrected.       Ursula Alert, MD 12/05/2018, 5:44 PM

## 2018-12-06 ENCOUNTER — Ambulatory Visit: Payer: Medicare Other | Admitting: Speech Pathology

## 2018-12-10 DIAGNOSIS — G4733 Obstructive sleep apnea (adult) (pediatric): Secondary | ICD-10-CM | POA: Diagnosis not present

## 2018-12-11 ENCOUNTER — Ambulatory Visit: Payer: Medicare Other | Admitting: Speech Pathology

## 2018-12-13 ENCOUNTER — Ambulatory Visit: Payer: Medicare Other | Admitting: Speech Pathology

## 2018-12-15 ENCOUNTER — Other Ambulatory Visit: Payer: Self-pay

## 2018-12-15 ENCOUNTER — Ambulatory Visit: Payer: PPO | Admitting: Licensed Clinical Social Worker

## 2018-12-15 DIAGNOSIS — F4310 Post-traumatic stress disorder, unspecified: Secondary | ICD-10-CM

## 2018-12-18 ENCOUNTER — Ambulatory Visit: Payer: Medicare Other | Admitting: Speech Pathology

## 2018-12-19 ENCOUNTER — Ambulatory Visit (INDEPENDENT_AMBULATORY_CARE_PROVIDER_SITE_OTHER): Payer: Medicare Other

## 2018-12-19 VITALS — BP 120/84 | Ht 65.0 in | Wt 171.0 lb

## 2018-12-19 DIAGNOSIS — Z598 Other problems related to housing and economic circumstances: Secondary | ICD-10-CM

## 2018-12-19 DIAGNOSIS — Z1231 Encounter for screening mammogram for malignant neoplasm of breast: Secondary | ICD-10-CM | POA: Diagnosis not present

## 2018-12-19 DIAGNOSIS — Z599 Problem related to housing and economic circumstances, unspecified: Secondary | ICD-10-CM

## 2018-12-19 DIAGNOSIS — Z0000 Encounter for general adult medical examination without abnormal findings: Secondary | ICD-10-CM

## 2018-12-19 NOTE — Progress Notes (Signed)
Subjective:   Janice Wilson is a 63 y.o. female who presents for Medicare Annual (Subsequent) preventive examination.  Virtual Visit via Telephone Note  I connected with Janice Wilson on 12/19/18 at  1:00 PM EDT by telephone and verified that I am speaking with the correct person using two identifiers.  Medicare Annual Wellness visit completed telephonically due to Covid-19 pandemic.  Location: Patient: home Provider: office   I discussed the limitations, risks, security and privacy concerns of performing an evaluation and management service by telephone and the availability of in person appointments. The patient expressed understanding and agreed to proceed.  Some vital signs may be absent or patient reported.   Clemetine Marker, LPN   Review of Systems:   Cardiac Risk Factors include: hypertension     Objective:     Vitals: BP 120/84   Ht 5\' 5"  (1.651 m)   Wt 171 lb (77.6 kg)   BMI 28.46 kg/m   Body mass index is 28.46 kg/m.  Advanced Directives 12/19/2018 04/20/2018 04/10/2018 12/06/2017 01/12/2017 12/16/2016 12/09/2016  Does Patient Have a Medical Advance Directive? No No No No No No No  Does patient want to make changes to medical advance directive? - - - Yes (MAU/Ambulatory/Procedural Areas - Information given) - - -  Would patient like information on creating a medical advance directive? Yes (MAU/Ambulatory/Procedural Areas - Information given) Yes (MAU/Ambulatory/Procedural Areas - Information given) No - Patient declined - No - Patient declined - -  Pre-existing out of facility DNR order (yellow form or pink MOST form) - - - - - - -  Some encounter information is confidential and restricted. Go to Review Flowsheets activity to see all data.    Tobacco Social History   Tobacco Use  Smoking Status Current Every Day Smoker  . Packs/day: 0.25  . Years: 25.00  . Pack years: 6.25  . Types: Cigarettes  Smokeless Tobacco Never Used     Ready to quit: Not  Answered Counseling given: Not Answered   Clinical Intake:  Pre-visit preparation completed: Yes  Pain : No/denies pain     BMI - recorded: 28.46 Nutritional Status: BMI 25 -29 Overweight Nutritional Risks: None Diabetes: No  How often do you need to have someone help you when you read instructions, pamphlets, or other written materials from your doctor or pharmacy?: 1 - Never  Interpreter Needed?: No  Information entered by :: Clemetine Marker LPN  Past Medical History:  Diagnosis Date  . Anxiety   . Asthma   . Back pain   . Dental crown present    dental implants - top, front  . Depression   . GERD (gastroesophageal reflux disease)   . Glaucoma   . Headache(784.0)   . Heart murmur    followed by PCP  . Hypertension   . Neck pain    s/p fusion.  limited side-to-side motion.  No limits up and down motion.  . Neck pain   . Panic attack   . Shortness of breath   . Sleep apnea    uses CPAP machine sleep study 9/ 2012 done at sleep med in [place]  . Vertigo    no episodes in over 10 yrs  . Wears dentures    partial upper  . Wears hearing aid    left ear   Past Surgical History:  Procedure Laterality Date  . ANTERIOR CERVICAL DECOMP/DISCECTOMY FUSION  07/05/2011   Procedure: ANTERIOR CERVICAL DECOMPRESSION/DISCECTOMY FUSION 3 LEVELS;  Surgeon: Mallie Mussel  A Pool;  Location: [facility];  Service: Neurosurgery;  Laterality: N/A;  Cervical Four-Five, Cervical Five-Six, Cervical Six-Seven Anterior Cervical Decompression Fusion WITH ALLOGRAFT AND PLATING  . CESAREAN SECTION    . COLONOSCOPY WITH PROPOFOL N/A 04/16/2016   Procedure: COLONOSCOPY WITH PROPOFOL;  Surgeon: Lucilla Lame, MD;  Location: [facility];  Service: Endoscopy;  Laterality: N/A;  sleep apnea LATEX sensitivity  . ESOPHAGOGASTRODUODENOSCOPY (EGD) WITH PROPOFOL N/A 01/12/2017   Procedure: ESOPHAGOGASTRODUODENOSCOPY (EGD) WITH PROPOFOL;  Surgeon: Jonathon Bellows, MD;  Location: [facility];   Service: Endoscopy;  Laterality: N/A;  Latex sensitivity sleep apnea  . PARTIAL HYSTERECTOMY     Family History  Problem Relation Age of Onset  . Cancer Mother        colon  . Stroke Father   . Drug abuse Sister   . Alcohol abuse Sister   . Anesthesia problems Neg Hx   . Breast cancer Neg Hx    Social History   Socioeconomic History  . Marital status: Divorced    Spouse name: Not on file  . Number of children: 1  . Years of education: Not on file  . Highest education level: Associate degree: academic program  Occupational History  . Occupation: Disabled  Social Needs  . Financial resource strain: Somewhat hard  . Food insecurity    Worry: Sometimes true    Inability: Sometimes true  . Transportation needs    Medical: No    Non-medical: No  Tobacco Use  . Smoking status: Current Every Day Smoker    Packs/day: 0.25    Years: 25.00    Pack years: 6.25    Types: Cigarettes  . Smokeless tobacco: Never Used  Substance and Sexual Activity  . Alcohol use: Yes    Alcohol/week: 0.0 standard drinks    Comment: 1-2 beer a month(Holidays)  . Drug use: Not Currently    Types: Marijuana    Comment: daily  . Sexual activity: Not Currently    Partners: Female, Female    Birth control/protection: Condom  Lifestyle  . Physical activity    Days per week: 3 days    Minutes per session: 40 min  . Stress: To some extent  Relationships  . Social Herbalist on phone: Three times a week    Gets together: Never    Attends religious service: Never    Active member of club or organization: No    Attends meetings of clubs or organizations: Never    Relationship status: Divorced  Other Topics Concern  . Not on file  Social History Narrative   Moved to [facility]    Outpatient Encounter Medications as of 12/19/2018  Medication Sig  . acetaminophen (TYLENOL) 325 MG tablet Take 325 mg by mouth every 6 (six) hours as needed. For pain   . albuterol (PROVENTIL HFA;VENTOLIN  HFA) 108 (90 BASE) MCG/ACT inhaler Inhale 1 puff into the lungs every 4 (four) hours as needed. For shortness of breath  . amphetamine-dextroamphetamine (ADDERALL XR) 10 MG 24 hr capsule Take by mouth.  . cetirizine (ZYRTEC) 10 MG tablet Take 1 tablet (10 mg total) by mouth daily.  . clindamycin (CLEOCIN T) 1 % lotion   . conjugated estrogens (PREMARIN) vaginal cream Place vaginally.  . divalproex (DEPAKOTE ER) 500 MG 24 hr tablet Take 2 tablets (1,000 mg total) by mouth at bedtime.  . fluticasone (FLONASE) 50 MCG/ACT nasal spray Place 2 sprays into both nostrils daily. FOR CONGESTION  .  Fluticasone-Salmeterol (ADVAIR) 250-50 MCG/DOSE AEPB Inhale 1 puff into the lungs every 12 (twelve) hours as needed. FOR WHEEZING  . gabapentin (NEURONTIN) 400 MG capsule TAKE 1 CAPSULE (400 MG TOTAL) BY MOUTH 3 (THREE) TIMES DAILY  . hydrOXYzine (VISTARIL) 25 MG capsule TAKE 1 CAPSULE DAILY AS NEEDED FOR ANXIETY AND 2 CAPSULES AT BEDTIME FOR SLEEP  . latanoprost (XALATAN) 0.005 % ophthalmic solution Apply to eye.  Marland Kitchen lisinopril (ZESTRIL) 10 MG tablet TAKE 1-2 TABLETS BY MOUTH DAILY  . loratadine (CLARITIN) 10 MG tablet Take 10 mg by mouth daily.  . meloxicam (MOBIC) 15 MG tablet TAKE 1 TABLET BY MOUTH EVERY DAY  . montelukast (SINGULAIR) 10 MG tablet Take 1 tablet (10 mg total) by mouth at bedtime.  . Multiple Vitamins-Minerals (MULTIPLE VITAMINS/WOMENS PO) Take 1 tablet by mouth daily.  Marland Kitchen omeprazole (PRILOSEC) 40 MG capsule TAKE 1 CAPSULE BY MOUTH EVERY DAY  . rosuvastatin (CRESTOR) 20 MG tablet Take 1 tablet (20 mg total) by mouth daily.  . sertraline (ZOLOFT) 100 MG tablet Take 1 tablet (100 mg total) by mouth daily.  . timolol (BETIMOL) 0.5 % ophthalmic solution Place 1 drop into both eyes every morning.    Marland Kitchen tiZANidine (ZANAFLEX) 4 MG tablet Take 4 mg by mouth as needed.  Marland Kitchen amphetamine-dextroamphetamine (ADDERALL XR) 10 MG 24 hr capsule Take by mouth.  Derrill Memo ON 01/29/2019] amphetamine-dextroamphetamine  (ADDERALL XR) 10 MG 24 hr capsule Take by mouth.  . [DISCONTINUED] latanoprost (XALATAN) 0.005 % ophthalmic solution Apply 1 drop to eye at bedtime.  . [DISCONTINUED] triamcinolone (KENALOG) 0.025 % ointment Apply 1 application topically 2 (two) times daily.   No facility-administered encounter medications on file as of 12/19/2018.     Activities of Daily Living In your present state of health, do you have any difficulty performing the following activities: 12/19/2018 10/25/2018  Hearing? Y N  Comment does not have hearing aids -  Vision? N Y  Comment wears glasses -  Difficulty concentrating or making decisions? N Y  Comment - Seeing Dr. Shah-Neurologist  Walking or climbing stairs? N Y  Comment - Has a hard time bending her right knee  Dressing or bathing? N Y  Comment - Barely  Doing errands, shopping? N [place] and eating ? N -  Using the Toilet? N -  In the past six months, have you accidently leaked urine? N -  Do you have problems with loss of bowel control? N -  Managing your Medications? N -  Managing your Finances? N -  Housekeeping or managing your Housekeeping? N -  Some recent data might be hidden    Patient Care Team: Steele Sizer, MD as PCP - General (Family Medicine) Catheryn Bacon, CNM as Midwife (Obstetrics and Gynecology) Earnie Larsson, MD as Consulting Physician (Neurosurgery) Marlaine Hind, MD as Consulting Physician (Physical Medicine and Rehabilitation) [place], Darla Lesches, [place] as Social Worker    Assessment:   This is a routine wellness examination for [place].  Exercise Activities and Dietary recommendations Current Exercise Habits: Home exercise routine, Type of exercise: walking, Time (Minutes): 45, Frequency (Times/Week): 3, Weekly Exercise (Minutes/Week): 135, Intensity: Mild, Exercise limited by: None identified  Goals    . DIET - INCREASE WATER INTAKE     Recommend to drink at least 6-8 8oz glasses of water per day.     . Patient Stated (pt-stated)     Current Barriers:  . Limited education about identifying local, in  network providers * . Lacks knowledge of community resource: Dentist for the local Department of Social Services  [phone]  Clinical Social Work Clinical Goal(s):  Marland Kitchen Over the next 30 days, patient will work with her insurance company to identify local, in network providers and will contact the Department of [place] to request increase in food stamp allotment   Interventions: . Patient interviewed and appropriate assessments performed . Provided patient with information about identifying in network providers and follow up on food stamp allotment . Advised patient to contact the customer service number on her card for in network providers. Patient also advised to contact the Department of Social Services to request an increase in food stamp allotment  Patient Self Care Activities:  . Self administers medications as prescribed . Attends all scheduled provider appointments . Performs ADL's independently . Performs IADL's independently . Calls provider office for new concerns or questions  Initial goal documentation     . Quit Smoking       Fall Risk Fall Risk  12/19/2018 11/23/2018 11/23/2018 10/25/2018 07/26/2018  Falls in the past year? 0 0 0 0 0  Number falls in past yr: 0 0 0 0 -  Injury with Fall? 0 0 0 0 -  Risk for fall due to : - - - - -  Risk for fall due to: Comment - - - - -   FALL RISK PREVENTION PERTAINING TO THE HOME:  Any stairs in or around the home? No  If so, do they handrails? No   Home free of loose throw rugs in walkways, pet beds, electrical cords, etc? Yes  Adequate lighting in your home to reduce risk of falls? Yes   ASSISTIVE DEVICES UTILIZED TO PREVENT FALLS:  Life alert? No  Use of a cane, walker or w/c? No  Grab bars in the bathroom? No  Shower chair or bench in shower? No  Elevated toilet seat or a handicapped toilet? No   DME  ORDERS:  DME order needed?  No   TIMED UP AND GO:  Was the test performed? No .  Telephonic visit.  Education: Fall risk prevention has been discussed.  Intervention(s) required? No    Depression Screen PHQ 2/9 Scores 12/19/2018 11/23/2018 11/23/2018 10/25/2018  PHQ - 2 Score 0 1 0 0  PHQ- 9 Score - 2 - 7     Cognitive Function     6CIT Screen 12/19/2018 12/21/2017 12/06/2017  What Year? 0 points 0 points 0 points  What month? 0 points 3 points 0 points  What time? 0 points 0 points 0 points  Count back from 20 0 points 2 points 0 points  Months in reverse 2 points 0 points 0 points  Repeat phrase 0 points 2 points 2 points  Total Score 2 7 2     Immunization History  Administered Date(s) Administered  . Influenza,inj,Quad PF,6+ Mos 02/18/2014, 03/25/2017, 02/06/2018  . Tdap 08/24/2016    Qualifies for Shingles Vaccine? Yes  . Due for Shingrix. Education has been provided regarding the importance of this vaccine. Pt has been advised to call insurance company to determine out of pocket expense. Advised may also receive vaccine at local pharmacy or Health Dept. Verbalized acceptance and understanding.  Tdap: Up to date  Flu Vaccine: Up to date  Pneumococcal Vaccine: Due for Pneumococcal vaccine at age 29.   Screening Tests Health Maintenance  Topic Date Due  . MAMMOGRAM  09/30/2018  . INFLUENZA VACCINE  01/20/2019  .  PAP SMEAR-Modifier  08/11/2019  . COLONOSCOPY  04/16/2026  . TETANUS/TDAP  08/25/2026  . Hepatitis C Screening  Completed  . HIV Screening  Completed    Cancer Screenings:  Colorectal Screening: Completed 04/16/16. Repeat every 10 years.  Mammogram: Completed 09/29/17. Repeat every year. Ordered today. Pt provided with contact information and advised to call to schedule appt.   Bone Density: Not completed, ordered 02/06/18.  Lung Cancer Screening: (Low Dose CT Chest recommended if Age 65-80 years, 30 pack-year currently smoking OR have quit w/in 15years.)  does not qualify.    Additional Screening:  Hepatitis C Screening: does qualify; Completed 08/10/16  Vision Screening: Recommended annual ophthalmology exams for early detection of glaucoma and other disorders of the eye. Is the patient up to date with their annual eye exam?  No  Who is the provider or what is the name of the office in which the pt attends annual eye exams? Provider in [place], [place]  Dental Screening: Recommended annual dental exams for proper oral hygiene  Community Resource Referral:  CRR required this visit?  Yes      Plan:     I have personally reviewed and addressed the Medicare Annual Wellness questionnaire and have noted the following in the patient's chart:  A. Medical and social history B. Use of alcohol, tobacco or illicit drugs  C. Current medications and supplements D. Functional ability and status E.  Nutritional status F.  Physical activity G. Advance directives H. List of other physicians I.  Hospitalizations, surgeries, and ER visits in previous 12 months J.  [place] such as hearing and vision if needed, cognitive and depression L. Referrals and appointments   In addition, I have reviewed and discussed with patient certain preventive protocols, quality metrics, and best practice recommendations. A written personalized care plan for preventive services as well as general preventive health recommendations were provided to patient.   Signed,  Clemetine Marker, LPN Nurse Health Advisor   Nurse Notes: none

## 2018-12-19 NOTE — Progress Notes (Signed)
Therapist contacted [facility] at 10 am for virtual session.  Therapist left message encouraging contact.  No session.

## 2018-12-19 NOTE — Patient Instructions (Signed)
Ms. Janice Wilson , Thank you for taking time to come for your Medicare Wellness Visit. I appreciate your ongoing commitment to your health goals. Please review the following plan we discussed and let me know if I can assist you in the future.   Screening recommendations/referrals: Colonoscopy: done 04/16/16. Repeat in 2027. Mammogram: done 09/29/17. Please call [phone] to schedule your mammogram.   Recommended yearly ophthalmology/optometry visit for glaucoma screening and checkup Recommended yearly dental visit for hygiene and checkup  Vaccinations: Influenza vaccine: done 02/06/18 Pneumococcal vaccine: due age 25 Tdap vaccine: done 08/24/16 Shingles vaccine: Shingrix discussed. Please contact your pharmacy for coverage information.   Advanced directives: Advance directive discussed with you today. I have mailed a copy for you to complete at home and have notarized. Once this is complete please bring a copy in to our office so we can scan it into your chart.  Conditions/risks identified:   Free hearing clinics offered in [place]:   [facility] [address], [place], [place] [address] [phone]  [facility], [place], [place] [address] [phone]   Next appointment: Please follow up in one year for your Medicare Annual Wellness visit.    Preventive Care 40-64 Years, Female Preventive care refers to lifestyle choices and visits with your health care provider that can promote health and wellness. What does preventive care include?  A yearly physical exam. This is also called an annual well check.  Dental exams once or twice a year.  Routine eye exams. Ask your health care provider how often you should have your eyes checked.  Personal lifestyle choices, including:  Daily care of your teeth and gums.  Regular physical activity.  Eating a healthy diet.  Avoiding tobacco and drug use.  Limiting alcohol use.   Practicing safe sex.  Taking low-dose aspirin daily starting at age 26.  Taking vitamin and mineral supplements as recommended by your health care provider. What happens during an annual well check? The services and screenings done by your health care provider during your annual well check will depend on your age, overall health, lifestyle risk factors, and family history of disease. Counseling  Your health care provider may ask you questions about your:  Alcohol use.  Tobacco use.  Drug use.  Emotional well-being.  Home and relationship well-being.  Sexual activity.  Eating habits.  Work and work Statistician.  Method of birth control.  Menstrual cycle.  Pregnancy history. Screening  You may have the following tests or measurements:  Height, weight, and BMI.  Blood pressure.  Lipid and cholesterol levels. These may be checked every 5 years, or more frequently if you are over 85 years old.  Skin check.  Lung cancer screening. You may have this screening every year starting at age 54 if you have a 30-pack-year history of smoking and currently smoke or have quit within the past 15 years.  Fecal occult blood test (FOBT) of the stool. You may have this test every year starting at age 73.  Flexible sigmoidoscopy or colonoscopy. You may have a sigmoidoscopy every 5 years or a colonoscopy every 10 years starting at age 59.  Hepatitis C blood test.  Hepatitis B blood test.  Sexually transmitted disease (STD) testing.  Diabetes screening. This is done by checking your blood sugar (glucose) after you have not eaten for a while (fasting). You may have this done every 1-3 years.  Mammogram. This may be done every 1-2 years.  Talk to your health care provider about when you should start having regular mammograms. This may depend on whether you have a family history of breast cancer.  BRCA-related cancer screening. This may be done if you have a family history of breast,  ovarian, tubal, or peritoneal cancers.  Pelvic exam and Pap test. This may be done every 3 years starting at age 2. Starting at age 41, this may be done every 5 years if you have a Pap test in combination with an HPV test.  Bone density scan. This is done to screen for osteoporosis. You may have this scan if you are at high risk for osteoporosis. Discuss your test results, treatment options, and if necessary, the need for more tests with your health care provider. Vaccines  Your health care provider may recommend certain vaccines, such as:  Influenza vaccine. This is recommended every year.  Tetanus, diphtheria, and acellular pertussis (Tdap, Td) vaccine. You may need a Td booster every 10 years.  Zoster vaccine. You may need this after age 26.  Pneumococcal 13-valent conjugate (PCV13) vaccine. You may need this if you have certain conditions and were not previously vaccinated.  Pneumococcal polysaccharide (PPSV23) vaccine. You may need one or two doses if you smoke cigarettes or if you have certain conditions. Talk to your health care provider about which screenings and vaccines you need and how often you need them. This information is not intended to replace advice given to you by your health care provider. Make sure you discuss any questions you have with your health care provider. Document Released: 07/04/2015 Document Revised: 02/25/2016 Document Reviewed: 04/08/2015 Elsevier Interactive Patient Education  2017 [employer] Prevention in the Home Falls can cause injuries. They can happen to people of all ages. There are many things you can do to make your home safe and to help prevent falls. What can I do on the outside of my home?  Regularly fix the edges of walkways and driveways and fix any cracks.  Remove anything that might make you trip as you walk through a door, such as a raised step or threshold.  Trim any bushes or trees on the path to your home.  Use  bright outdoor lighting.  Clear any walking paths of anything that might make someone trip, such as rocks or tools.  Regularly check to see if handrails are loose or broken. Make sure that both sides of any steps have handrails.  Any raised decks and porches should have guardrails on the edges.  Have any leaves, snow, or ice cleared regularly.  Use sand or salt on walking paths during winter.  Clean up any spills in your garage right away. This includes oil or grease spills. What can I do in the bathroom?  Use night lights.  Install grab bars by the toilet and in the tub and shower. Do not use towel bars as grab bars.  Use non-skid mats or decals in the tub or shower.  If you need to sit down in the shower, use a plastic, non-slip stool.  Keep the floor dry. Clean up any water that spills on the floor as soon as it happens.  Remove soap buildup in the tub or shower regularly.  Attach bath mats securely with double-sided non-slip rug tape.  Do not have throw rugs and other things on the floor that can make you trip. What can I do in the bedroom?  Use night lights.  Make sure that  you have a light by your bed that is easy to reach.  Do not use any sheets or blankets that are too big for your bed. They should not hang down onto the floor.  Have a firm chair that has side arms. You can use this for support while you get dressed.  Do not have throw rugs and other things on the floor that can make you trip. What can I do in the kitchen?  Clean up any spills right away.  Avoid walking on wet floors.  Keep items that you use a lot in easy-to-reach places.  If you need to reach something above you, use a strong step stool that has a grab bar.  Keep electrical cords out of the way.  Do not use floor polish or wax that makes floors slippery. If you must use wax, use non-skid floor wax.  Do not have throw rugs and other things on the floor that can make you trip. What can  I do with my stairs?  Do not leave any items on the stairs.  Make sure that there are handrails on both sides of the stairs and use them. Fix handrails that are broken or loose. Make sure that handrails are as long as the stairways.  Check any carpeting to make sure that it is firmly attached to the stairs. Fix any carpet that is loose or worn.  Avoid having throw rugs at the top or bottom of the stairs. If you do have throw rugs, attach them to the floor with carpet tape.  Make sure that you have a light switch at the top of the stairs and the bottom of the stairs. If you do not have them, ask someone to add them for you. What else can I do to help prevent falls?  Wear shoes that:  Do not have high heels.  Have rubber bottoms.  Are comfortable and fit you well.  Are closed at the toe. Do not wear sandals.  If you use a stepladder:  Make sure that it is fully opened. Do not climb a closed stepladder.  Make sure that both sides of the stepladder are locked into place.  Ask someone to hold it for you, if possible.  Clearly mark and make sure that you can see:  Any grab bars or handrails.  First and last steps.  Where the edge of each step is.  Use tools that help you move around (mobility aids) if they are needed. These include:  Canes.  Walkers.  Scooters.  Crutches.  Turn on the lights when you go into a dark area. Replace any light bulbs as soon as they burn out.  Set up your furniture so you have a clear path. Avoid moving your furniture around.  If any of your floors are uneven, fix them.  If there are any pets around you, be aware of where they are.  Review your medicines with your doctor. Some medicines can make you feel dizzy. This can increase your chance of falling. Ask your doctor what other things that you can do to help prevent falls. This information is not intended to replace advice given to you by your health care provider. Make sure you  discuss any questions you have with your health care provider. Document Released: 04/03/2009 Document Revised: 11/13/2015 Document Reviewed: 07/12/2014 Elsevier Interactive Patient Education  2017 [employer].

## 2018-12-20 ENCOUNTER — Ambulatory Visit: Payer: Medicare Other | Admitting: Speech Pathology

## 2018-12-20 ENCOUNTER — Telehealth: Payer: Self-pay

## 2018-12-20 NOTE — Telephone Encounter (Signed)
Copied from [place] [phone]. Topic: Referral - Status >> Dec 20, 2018 72:09 AM Simone Curia D wrote: 09/25/960 Spoke with patient about [employer] in [place] and [place] Extra Help program.  Patient stated she was fine for now and had no immediate needs. She was fine with me emailing the resources to her for future reference and I gave her my name and number. Ambrose Mantle, [place]

## 2018-12-24 ENCOUNTER — Other Ambulatory Visit: Payer: Self-pay | Admitting: Psychiatry

## 2018-12-24 DIAGNOSIS — F4310 Post-traumatic stress disorder, unspecified: Secondary | ICD-10-CM

## 2018-12-25 ENCOUNTER — Ambulatory Visit: Payer: Medicare Other | Admitting: Speech Pathology

## 2018-12-27 ENCOUNTER — Ambulatory Visit: Payer: Medicare Other | Admitting: Speech Pathology

## 2019-01-01 ENCOUNTER — Ambulatory Visit: Payer: Medicare Other | Admitting: Speech Pathology

## 2019-01-03 ENCOUNTER — Ambulatory Visit: Payer: Medicare Other | Admitting: Speech Pathology

## 2019-01-04 ENCOUNTER — Other Ambulatory Visit: Payer: Self-pay

## 2019-01-04 ENCOUNTER — Ambulatory Visit
Admission: RE | Admit: 2019-01-04 | Discharge: 2019-01-04 | Disposition: A | Discharge status: Home / Self Care | Payer: Medicare Other | Source: Ambulatory Visit | Attending: Family Medicine | Admitting: Family Medicine

## 2019-01-04 DIAGNOSIS — Z1231 Encounter for screening mammogram for malignant neoplasm of breast: Secondary | ICD-10-CM | POA: Diagnosis not present

## 2019-01-08 ENCOUNTER — Other Ambulatory Visit: Payer: Self-pay | Admitting: Family Medicine

## 2019-01-08 DIAGNOSIS — I10 Essential (primary) hypertension: Secondary | ICD-10-CM

## 2019-01-09 DIAGNOSIS — G4733 Obstructive sleep apnea (adult) (pediatric): Secondary | ICD-10-CM | POA: Diagnosis not present

## 2019-01-10 ENCOUNTER — Other Ambulatory Visit: Payer: Self-pay | Admitting: Family Medicine

## 2019-01-10 DIAGNOSIS — I10 Essential (primary) hypertension: Secondary | ICD-10-CM

## 2019-01-29 ENCOUNTER — Ambulatory Visit (INDEPENDENT_AMBULATORY_CARE_PROVIDER_SITE_OTHER): Payer: Medicare Other | Admitting: Psychiatry

## 2019-01-29 ENCOUNTER — Encounter: Payer: Self-pay | Admitting: Psychiatry

## 2019-01-29 ENCOUNTER — Other Ambulatory Visit: Payer: Self-pay

## 2019-01-29 DIAGNOSIS — F17200 Nicotine dependence, unspecified, uncomplicated: Secondary | ICD-10-CM

## 2019-01-29 DIAGNOSIS — F331 Major depressive disorder, recurrent, moderate: Secondary | ICD-10-CM | POA: Diagnosis not present

## 2019-01-29 DIAGNOSIS — G4701 Insomnia due to medical condition: Secondary | ICD-10-CM | POA: Diagnosis not present

## 2019-01-29 DIAGNOSIS — F4310 Post-traumatic stress disorder, unspecified: Secondary | ICD-10-CM | POA: Diagnosis not present

## 2019-01-29 DIAGNOSIS — F1220 Cannabis dependence, uncomplicated: Secondary | ICD-10-CM

## 2019-01-29 MED ORDER — HYDROXYZINE HCL 25 MG PO TABS: 25.0000 mg | ORAL_TABLET | Freq: Three times a day (TID) | ORAL | 0 refills | Status: DC | PRN

## 2019-01-29 MED ORDER — DIVALPROEX SODIUM ER 500 MG PO TB24: 1000.0000 mg | ORAL_TABLET | Freq: Every day | ORAL | 0 refills | Status: DC

## 2019-01-29 MED ORDER — SERTRALINE HCL 100 MG PO TABS: 100.0000 mg | ORAL_TABLET | Freq: Every day | ORAL | 0 refills | Status: DC

## 2019-01-29 NOTE — Progress Notes (Signed)
Virtual Visit via Video Note  I connected with Janice Wilson on 01/29/19 at 10:45 AM EDT by a video enabled telemedicine application and verified that I am speaking with the correct person using two identifiers.   I discussed the limitations of evaluation and management by telemedicine and the availability of in person appointments. The patient expressed understanding and agreed to proceed.   I discussed the assessment and treatment plan with the patient. The patient was provided an opportunity to ask questions and all were answered. The patient agreed with the plan and demonstrated an understanding of the instructions.   The patient was advised to call back or seek an in-person evaluation if the symptoms worsen or if the condition fails to improve as anticipated. [place] MD OP Progress Note  01/29/2019 3:52 PM Janice Wilson  MRN:  [id number]  Chief Complaint:  Chief Complaint    Follow-up     HPI: Janice Wilson is a 63 year old African-American female, divorced, on SSD, lives in [place], has a history of PTSD, MDD, cannabis use disorder, insomnia, tobacco use disorder was evaluated by telemedicine today.  Patient today reports she is currently doing well on the current medication regimen.  She however reports she wants to hydroxyzine capsule to be changed to tablets and she feels that the capusle is giving her side effects.  She reports mood otherwise is stable.  She reports sleep is good.  She has been cutting back on her smoking cigarettes as well as cannabis.  She continues to work with her neurologist and reports she is currently on Ritalin.  She is using it only as needed.  Patient denies any suicidality, homicidality or perceptual disturbances.  Visit Diagnosis:    ICD-10-CM   1. PTSD (post-traumatic stress disorder)  F43.10 sertraline (ZOLOFT) 100 MG tablet    divalproex (DEPAKOTE ER) 500 MG 24 hr tablet    hydrOXYzine (ATARAX/VISTARIL) 25 MG tablet  2. MDD (major  depressive disorder), recurrent episode, moderate (HCC)  F33.1 sertraline (ZOLOFT) 100 MG tablet  3. Insomnia due to medical condition  G47.01 hydrOXYzine (ATARAX/VISTARIL) 25 MG tablet  4. Cannabis use disorder, moderate, dependence (HCC)  F12.20   5. Tobacco use disorder  F17.200     Past Psychiatric History: I have reviewed past psychiatric history from my progress note on 04/10/2018.  Past trials of Zoloft, lorazepam.  Past Medical History:  Past Medical History:  Diagnosis Date  . Anxiety   . Asthma   . Back pain   . Dental crown present    dental implants - top, front  . Depression   . GERD (gastroesophageal reflux disease)   . Glaucoma   . Headache(784.0)   . Heart murmur    followed by PCP  . Hypertension   . Neck pain    s/p fusion.  limited side-to-side motion.  No limits up and down motion.  . Neck pain   . Panic attack   . Shortness of breath   . Sleep apnea    uses CPAP machine sleep study 9/ 2012 done at sleep med in [place]  . Vertigo    no episodes in over 10 yrs  . Wears dentures    partial upper  . Wears hearing aid    left ear    Past Surgical History:  Procedure Laterality Date  . ABDOMINAL HYSTERECTOMY    . ANTERIOR CERVICAL DECOMP/DISCECTOMY FUSION  07/05/2011   Procedure: ANTERIOR CERVICAL DECOMPRESSION/DISCECTOMY FUSION 3 LEVELS;  Surgeon: Cooper Render Pool;  Location:  [facility];  Service: Neurosurgery;  Laterality: N/A;  Cervical Four-Five, Cervical Five-Six, Cervical Six-Seven Anterior Cervical Decompression Fusion WITH ALLOGRAFT AND PLATING  . CESAREAN SECTION    . COLONOSCOPY WITH PROPOFOL N/A 04/16/2016   Procedure: COLONOSCOPY WITH PROPOFOL;  Surgeon: Lucilla Lame, MD;  Location: [facility];  Service: Endoscopy;  Laterality: N/A;  sleep apnea LATEX sensitivity  . ESOPHAGOGASTRODUODENOSCOPY (EGD) WITH PROPOFOL N/A 01/12/2017   Procedure: ESOPHAGOGASTRODUODENOSCOPY (EGD) WITH PROPOFOL;  Surgeon: Jonathon Bellows, MD;  Location: [facility];  Service: Endoscopy;  Laterality: N/A;  Latex sensitivity sleep apnea  . PARTIAL HYSTERECTOMY      Family Psychiatric History: I have reviewed family psychiatric history from my progress note on 04/10/2018.  Family History:  Family History  Problem Relation Age of Onset  . Cancer Mother        colon  . Stroke Father   . Drug abuse Sister   . Alcohol abuse Sister   . Anesthesia problems Neg Hx   . Breast cancer Neg Hx     Social History: I have reviewed social history from my progress note on 04/10/2018. Social History   Socioeconomic History  . Marital status: Divorced    Spouse name: Not on file  . Number of children: 1  . Years of education: Not on file  . Highest education level: Associate degree: academic program  Occupational History  . Occupation: Disabled  Social Needs  . Financial resource strain: Somewhat hard  . Food insecurity    Worry: Sometimes true    Inability: Sometimes true  . Transportation needs    Medical: No    Non-medical: No  Tobacco Use  . Smoking status: Current Every Day Smoker    Packs/day: 0.25    Years: 25.00    Pack years: 6.25    Types: Cigarettes  . Smokeless tobacco: Never Used  Substance and Sexual Activity  . Alcohol use: Yes    Alcohol/week: 0.0 standard drinks    Comment: 1-2 beer a month(Holidays)  . Drug use: Not Currently    Types: Marijuana    Comment: daily  . Sexual activity: Not Currently    Partners: Female, Female    Birth control/protection: Condom  Lifestyle  . Physical activity    Days per week: 3 days    Minutes per session: 40 min  . Stress: To some extent  Relationships  . Social Herbalist on phone: Three times a week    Gets together: Never    Attends religious service: Never    Active member of club or organization: No    Attends meetings of clubs or organizations: Never    Relationship status: Divorced  Other Topics Concern  . Not on file  Social History Narrative    Moved to [place]    Allergies:  Allergies  Allergen Reactions  . Latex Rash    Irritation  (Condoms and gloves)    Metabolic Disorder Labs: Lab Results  Component Value Date   HGBA1C 6.4 (H) 07/26/2018   MPG 136.98 07/26/2018   MPG 126 02/06/2018   No results found for: PROLACTIN Lab Results  Component Value Date   CHOL 147 10/27/2017   TRIG 100 10/27/2017   HDL 44 (L) 10/27/2017   CHOLHDL 3.3 10/27/2017   VLDL 17 02/14/2017   LDLCALC 83 10/27/2017   LDLCALC 173 (H) 02/14/2017   Lab Results  Component Value Date   TSH 0.70 08/10/2016   TSH 0.34 (  L) 09/14/2013    Therapeutic Level Labs: No results found for: LITHIUM Lab Results  Component Value Date   VALPROATE 83 08/29/2018   VALPROATE 30 (L) 05/05/2018   No components found for:  CBMZ  Current Medications: Current Outpatient Medications  Medication Sig Dispense Refill  . methylphenidate (METADATE ER) 20 MG ER tablet Take by mouth.    Marland Kitchen acetaminophen (TYLENOL) 325 MG tablet Take 325 mg by mouth every 6 (six) hours as needed. For pain     . albuterol (PROVENTIL HFA;VENTOLIN HFA) 108 (90 BASE) MCG/ACT inhaler Inhale 1 puff into the lungs every 4 (four) hours as needed. For shortness of breath    . amphetamine-dextroamphetamine (ADDERALL XR) 10 MG 24 hr capsule Take by mouth.    Marland Kitchen amphetamine-dextroamphetamine (ADDERALL XR) 10 MG 24 hr capsule Take by mouth.    Marland Kitchen amphetamine-dextroamphetamine (ADDERALL XR) 10 MG 24 hr capsule Take by mouth.    . cetirizine (ZYRTEC) 10 MG tablet Take 1 tablet (10 mg total) by mouth daily. 90 tablet 3  . clindamycin (CLEOCIN T) 1 % lotion     . conjugated estrogens (PREMARIN) vaginal cream Place vaginally.    . divalproex (DEPAKOTE ER) 500 MG 24 hr tablet Take 2 tablets (1,000 mg total) by mouth at bedtime. 180 tablet 0  . fluticasone (FLONASE) 50 MCG/ACT nasal spray Place 2 sprays into both nostrils daily. FOR CONGESTION 48 g 1  . Fluticasone-Salmeterol (ADVAIR) 250-50 MCG/DOSE  AEPB Inhale 1 puff into the lungs every 12 (twelve) hours as needed. FOR WHEEZING 180 each 1  . gabapentin (NEURONTIN) 400 MG capsule TAKE 1 CAPSULE (400 MG TOTAL) BY MOUTH 3 (THREE) TIMES DAILY  1  . hydrOXYzine (ATARAX/VISTARIL) 25 MG tablet Take 1 tablet (25 mg total) by mouth 3 (three) times daily as needed for anxiety. 270 tablet 0  . latanoprost (XALATAN) 0.005 % ophthalmic solution Apply to eye.    Marland Kitchen lisinopril (ZESTRIL) 10 MG tablet TAKE 1 TO 2 TABLETS BY MOUTH DAILY 60 tablet 1  . loratadine (CLARITIN) 10 MG tablet Take 10 mg by mouth daily.    . meloxicam (MOBIC) 15 MG tablet TAKE 1 TABLET BY MOUTH EVERY DAY 30 tablet 0  . [START ON 02/22/2019] methylphenidate (METADATE ER) 20 MG ER tablet Take by mouth.    . montelukast (SINGULAIR) 10 MG tablet Take 1 tablet (10 mg total) by mouth at bedtime. 90 tablet 1  . Multiple Vitamins-Minerals (MULTIPLE VITAMINS/WOMENS PO) Take 1 tablet by mouth daily.    Marland Kitchen omeprazole (PRILOSEC) 40 MG capsule TAKE 1 CAPSULE BY MOUTH EVERY DAY 30 capsule 5  . rosuvastatin (CRESTOR) 20 MG tablet Take 1 tablet (20 mg total) by mouth daily. 90 tablet 1  . sertraline (ZOLOFT) 100 MG tablet Take 1 tablet (100 mg total) by mouth daily. 90 tablet 0  . timolol (BETIMOL) 0.5 % ophthalmic solution Place 1 drop into both eyes every morning.      Marland Kitchen tiZANidine (ZANAFLEX) 4 MG tablet Take 4 mg by mouth as needed.  1   No current facility-administered medications for this visit.      Musculoskeletal: Strength & Muscle Tone: UTA Gait & Station: Reports as WNL Patient leans: N/A  Psychiatric Specialty Exam: Review of Systems  Psychiatric/Behavioral: The patient is nervous/anxious.   All other systems reviewed and are negative.   There were no vitals taken for this visit.There is no height or weight on file to calculate BMI.  General Appearance: UTA  Eye Contact:  UTA  Speech:  Clear and Coherent  Volume:  Normal  Mood:  Anxious  Affect:  UTA  Thought Process:  Goal  Directed and Descriptions of Associations: Intact  Orientation:  Full (Time, Place, and Person)  Thought Content: Logical   Suicidal Thoughts:  No  Homicidal Thoughts:  No  Memory:  Immediate;   Fair Recent;   Fair Remote;   Fair  Judgement:  Fair  Insight:  Fair  Psychomotor Activity:  UTA  Concentration:  Concentration: Fair and Attention Span: Fair  Recall:  [employer] of Knowledge: Fair  Language: Fair  Akathisia:  No  Handed:  Right  AIMS (if indicated): Denies tremors, rigidity  Assets:  Communication Skills Desire for Improvement Social Support  ADL's:  Intact  Cognition: WNL  Sleep:  Fair   Screenings: GAD-7     Office Visit from 07/26/2018 in [facility] Office Visit from 04/24/2018 in [facility] Office Visit from 03/16/2018 in [facility] Office Visit from 12/21/2017 in [facility] Office Visit from 10/27/2017 in [facility]  Total GAD-7 Score  17  20  20   0  18    PHQ2-9     Clinical Support from 12/19/2018 in [facility] Most recent reading at 12/19/2018  1:21 PM Office Visit from 11/23/2018 in [facility] Most recent reading at 11/23/2018 11:09 AM Chronic Care Management from 11/23/2018 in [facility] Most recent reading at 11/23/2018  9:10 AM Office Visit from 10/25/2018 in [facility] Most recent reading at 10/25/2018  1:25 PM Office Visit from 07/26/2018 in [facility] Most recent reading at 07/26/2018 12:13 PM  PHQ-2 Total Score  0  1  0  0  2  PHQ-9 Total Score  -  2  -  7  14       Assessment and Plan: Janice Wilson is a 63 year old African-American female on disability, lives in [place], has a history of PTSD, MDD, cannabis use disorder, tobacco use disorder, chronic pain, hypertension was evaluated by telemedicine today.  She is biologically predisposed given her history of trauma as  well as substance abuse problems, chronic pain.  She also has psychosocial stressors of her health issues, relationship struggles with her daughter.  Patient is currently doing well on the current medication regimen.  Plan For MDD-improving Zoloft 100 mg p.o. daily Depakote ER 1000 mg p.o. daily Depakote level on 08/29/2018 -83-therapeutic   For PTSD-improving Zoloft as prescribed.  For insomnia-improving Change hydroxyzine to 50 mg p.o. nightly as needed-will change it into a tablet since she reports side effects to [facility].  For cannabis use disorder-improving She has been cutting back.  Provided counseling.  For tobacco use disorder-improving Provided smoking cessation counseling.  Patient to continue psychotherapy sessions with Ms. Peacock.  Follow-up in clinic in 2 months or sooner if needed.  October 13 at 10 AM  I have spent atleast 15 minutes non face to face with patient today. More than 50 % of the time was spent for psychoeducation and supportive psychotherapy and care coordination.  This note was generated in part or whole with voice recognition software. Voice recognition is usually quite accurate but there are transcription errors that can and very often do occur. I apologize for any typographical errors that were not detected and corrected.         Ursula Alert, MD 01/29/2019, 3:52 PM

## 2019-01-31 ENCOUNTER — Other Ambulatory Visit: Payer: Self-pay | Admitting: Psychiatry

## 2019-01-31 DIAGNOSIS — F331 Major depressive disorder, recurrent, moderate: Secondary | ICD-10-CM

## 2019-01-31 DIAGNOSIS — F4310 Post-traumatic stress disorder, unspecified: Secondary | ICD-10-CM

## 2019-02-08 ENCOUNTER — Other Ambulatory Visit: Payer: Self-pay | Admitting: Family Medicine

## 2019-02-08 DIAGNOSIS — J3089 Other allergic rhinitis: Secondary | ICD-10-CM

## 2019-02-09 DIAGNOSIS — H401131 Primary open-angle glaucoma, bilateral, mild stage: Secondary | ICD-10-CM | POA: Diagnosis not present

## 2019-02-09 DIAGNOSIS — G4733 Obstructive sleep apnea (adult) (pediatric): Secondary | ICD-10-CM | POA: Diagnosis not present

## 2019-02-14 ENCOUNTER — Other Ambulatory Visit: Payer: Self-pay

## 2019-02-14 DIAGNOSIS — I10 Essential (primary) hypertension: Secondary | ICD-10-CM

## 2019-02-14 DIAGNOSIS — J4540 Moderate persistent asthma, uncomplicated: Secondary | ICD-10-CM

## 2019-02-14 MED ORDER — ALBUTEROL SULFATE HFA 108 (90 BASE) MCG/ACT IN AERS: 1.0000 | INHALATION_SPRAY | RESPIRATORY_TRACT | 0 refills | Status: DC | PRN

## 2019-02-14 MED ORDER — FLUTICASONE-SALMETEROL 250-50 MCG/DOSE IN AEPB: 1.0000 | INHALATION_SPRAY | Freq: Two times a day (BID) | RESPIRATORY_TRACT | 0 refills | Status: DC | PRN

## 2019-02-14 MED ORDER — LORATADINE 10 MG PO TABS: 10.0000 mg | ORAL_TABLET | Freq: Every day | ORAL | 0 refills | Status: DC

## 2019-02-14 MED ORDER — MELOXICAM 15 MG PO TABS: 15.0000 mg | ORAL_TABLET | Freq: Every day | ORAL | 0 refills | Status: DC

## 2019-02-14 MED ORDER — LISINOPRIL 10 MG PO TABS: 10.0000 mg | ORAL_TABLET | Freq: Every day | ORAL | 0 refills | Status: DC

## 2019-02-14 MED ORDER — ESTROGENS, CONJUGATED 0.625 MG/GM VA CREA: TOPICAL_CREAM | Freq: Every day | VAGINAL | 0 refills | Status: DC

## 2019-02-14 NOTE — Telephone Encounter (Signed)
Pharmacy is requesting 90 day prescription for patient Lisinopril .

## 2019-02-15 NOTE — Telephone Encounter (Signed)
vm full not able to leave message. Wanted to inform patient that prescriptions has been sent to pharmacy and that at dr Ancil Boozer request pt must come into the office for her September appt

## 2019-02-16 ENCOUNTER — Other Ambulatory Visit: Payer: Self-pay | Admitting: Family Medicine

## 2019-02-16 DIAGNOSIS — I10 Essential (primary) hypertension: Secondary | ICD-10-CM

## 2019-02-17 ENCOUNTER — Other Ambulatory Visit: Payer: Self-pay | Admitting: Family Medicine

## 2019-02-19 ENCOUNTER — Other Ambulatory Visit: Payer: Self-pay | Admitting: Family Medicine

## 2019-02-19 DIAGNOSIS — I10 Essential (primary) hypertension: Secondary | ICD-10-CM

## 2019-02-19 DIAGNOSIS — J4540 Moderate persistent asthma, uncomplicated: Secondary | ICD-10-CM

## 2019-02-28 MED ORDER — FLUTICASONE-SALMETEROL 250-50 MCG/DOSE IN AEPB: 1.0000 | INHALATION_SPRAY | Freq: Two times a day (BID) | RESPIRATORY_TRACT | 0 refills | Status: DC | PRN

## 2019-02-28 MED ORDER — LISINOPRIL 10 MG PO TABS: 10.0000 mg | ORAL_TABLET | Freq: Every day | ORAL | 0 refills | Status: DC

## 2019-02-28 MED ORDER — LORATADINE 10 MG PO TABS: 10.0000 mg | ORAL_TABLET | Freq: Every day | ORAL | 0 refills | Status: DC

## 2019-02-28 MED ORDER — ALBUTEROL SULFATE HFA 108 (90 BASE) MCG/ACT IN AERS: 1.0000 | INHALATION_SPRAY | RESPIRATORY_TRACT | 0 refills | Status: DC | PRN

## 2019-02-28 MED ORDER — ESTROGENS, CONJUGATED 0.625 MG/GM VA CREA: TOPICAL_CREAM | Freq: Every day | VAGINAL | 0 refills | Status: AC

## 2019-02-28 MED ORDER — MELOXICAM 15 MG PO TABS: 15.0000 mg | ORAL_TABLET | Freq: Every day | ORAL | 0 refills | Status: DC

## 2019-03-06 ENCOUNTER — Telehealth: Payer: Self-pay | Admitting: *Deleted

## 2019-03-06 ENCOUNTER — Other Ambulatory Visit: Payer: Self-pay

## 2019-03-06 ENCOUNTER — Ambulatory Visit (INDEPENDENT_AMBULATORY_CARE_PROVIDER_SITE_OTHER): Payer: Medicare Other | Admitting: Family Medicine

## 2019-03-06 ENCOUNTER — Encounter: Payer: Self-pay | Admitting: Family Medicine

## 2019-03-06 VITALS — BP 179/110 | HR 54 | Temp 96.9°F | Resp 16 | Ht 64.0 in | Wt 171.0 lb

## 2019-03-06 DIAGNOSIS — Z720 Tobacco use: Secondary | ICD-10-CM

## 2019-03-06 DIAGNOSIS — M542 Cervicalgia: Secondary | ICD-10-CM

## 2019-03-06 DIAGNOSIS — Z23 Encounter for immunization: Secondary | ICD-10-CM | POA: Diagnosis not present

## 2019-03-06 DIAGNOSIS — Z87891 Personal history of nicotine dependence: Secondary | ICD-10-CM

## 2019-03-06 DIAGNOSIS — Z122 Encounter for screening for malignant neoplasm of respiratory organs: Secondary | ICD-10-CM

## 2019-03-06 DIAGNOSIS — F331 Major depressive disorder, recurrent, moderate: Secondary | ICD-10-CM | POA: Diagnosis not present

## 2019-03-06 DIAGNOSIS — J4540 Moderate persistent asthma, uncomplicated: Secondary | ICD-10-CM

## 2019-03-06 DIAGNOSIS — I10 Essential (primary) hypertension: Secondary | ICD-10-CM | POA: Diagnosis not present

## 2019-03-06 DIAGNOSIS — E7800 Pure hypercholesterolemia, unspecified: Secondary | ICD-10-CM | POA: Diagnosis not present

## 2019-03-06 DIAGNOSIS — Z0000 Encounter for general adult medical examination without abnormal findings: Secondary | ICD-10-CM | POA: Diagnosis not present

## 2019-03-06 DIAGNOSIS — R739 Hyperglycemia, unspecified: Secondary | ICD-10-CM | POA: Diagnosis not present

## 2019-03-06 DIAGNOSIS — R7303 Prediabetes: Secondary | ICD-10-CM

## 2019-03-06 DIAGNOSIS — G8929 Other chronic pain: Secondary | ICD-10-CM

## 2019-03-06 DIAGNOSIS — J3089 Other allergic rhinitis: Secondary | ICD-10-CM

## 2019-03-06 DIAGNOSIS — G3184 Mild cognitive impairment, so stated: Secondary | ICD-10-CM

## 2019-03-06 DIAGNOSIS — L270 Generalized skin eruption due to drugs and medicaments taken internally: Secondary | ICD-10-CM

## 2019-03-06 DIAGNOSIS — J449 Chronic obstructive pulmonary disease, unspecified: Secondary | ICD-10-CM

## 2019-03-06 MED ORDER — LORATADINE 10 MG PO TABS: 10.0000 mg | ORAL_TABLET | Freq: Every day | ORAL | 0 refills | Status: DC

## 2019-03-06 MED ORDER — FLUTICASONE-SALMETEROL 250-50 MCG/DOSE IN AEPB: 1.0000 | INHALATION_SPRAY | Freq: Two times a day (BID) | RESPIRATORY_TRACT | 0 refills | Status: DC | PRN

## 2019-03-06 MED ORDER — MONTELUKAST SODIUM 10 MG PO TABS: 10.0000 mg | ORAL_TABLET | Freq: Every day | ORAL | 1 refills | Status: DC

## 2019-03-06 MED ORDER — ROSUVASTATIN CALCIUM 20 MG PO TABS: 20.0000 mg | ORAL_TABLET | Freq: Every day | ORAL | 1 refills | Status: DC

## 2019-03-06 MED ORDER — LISINOPRIL 10 MG PO TABS: 10.0000 mg | ORAL_TABLET | Freq: Every day | ORAL | 0 refills | Status: DC

## 2019-03-06 MED ORDER — TRIAMCINOLONE ACETONIDE 0.1 % EX CREA: 1.0000 "application " | TOPICAL_CREAM | Freq: Two times a day (BID) | CUTANEOUS | 0 refills | Status: DC

## 2019-03-06 MED ORDER — LISINOPRIL 10 MG PO TABS: 10.0000 mg | ORAL_TABLET | Freq: Two times a day (BID) | ORAL | 0 refills | Status: DC

## 2019-03-06 MED ORDER — TIZANIDINE HCL 4 MG PO TABS: 4.0000 mg | ORAL_TABLET | Freq: Three times a day (TID) | ORAL | 0 refills | Status: DC | PRN

## 2019-03-06 NOTE — Progress Notes (Signed)
Name: Janice Wilson   MRN: [id number]    DOB: 06/19/1956   Date:03/06/2019       Progress Note  Subjective  Chief Complaint  Chief Complaint  Patient presents with  . Annual Exam    HPI  Patient presents for annual CPE and follow up  HTN: bp is elevated today, and it was also high on her last visit in our office, she is taking a stimulant given by Dr. Manuella Ghazi for mild cognitive dysfunction , she does not want to add another medication, but she is wiling to take lisinopril BID   Pre-diabetes: A1C has been elevated, she feels like her mouth is always dry, she has been drinking more water, she denies polyphagia but has polyuria.   MDD: still seeing Dr. Shea Evans, her phq 9 was normal today, she is compliant with medication. Taking Zoloft and Atarax  Skin eruption: she states she has a recurrent rash on abdominal wall when she takes capsules. She states Dr. Manuella Ghazi changed mediation , she states lesions is drying now. She has some scars from previous rashes caused from capsules.   COPD/Asthma: she is using Advair daily, still has a daily morning cough, still smoking, uses Ventolin prn less than once a week. She states sputum is white   Diet: discussed a healthy diet, she states she needs to eat better Exercise: she states not enough, needs 150 minutes per week   USPSTF grade A and B recommendations    Office Visit from 03/06/2019 in [facility]  AUDIT-C Score  0     Depression: Phq 9 is  negative Depression screen [facility] 2/9 03/06/2019 12/19/2018 11/23/2018 11/23/2018 10/25/2018  Decreased Interest 0 0 1 0 0  Down, Depressed, Hopeless 0 0 0 0 0  PHQ - 2 Score 0 0 1 0 0  Altered sleeping 0 - 0 - 0  Tired, decreased energy 0 - 0 - 1  Change in appetite 0 - 0 - 0  Feeling bad or failure about yourself  0 - 0 - 0  Trouble concentrating 0 - 1 - 3  Moving slowly or fidgety/restless 0 - 0 - 3  Suicidal thoughts 0 - 0 - 0  PHQ-9 Score 0 - 2 - 7  Difficult doing work/chores - - Not  difficult at all - Extremely dIfficult  Some recent data might be hidden   Hypertension: BP Readings from Last 3 Encounters:  03/06/19 (!) 150/80  12/19/18 120/84  11/23/18 132/84   Obesity: Wt Readings from Last 3 Encounters:  03/06/19 171 lb (77.6 kg)  12/19/18 171 lb (77.6 kg)  11/23/18 176 lb (79.8 kg)   BMI Readings from Last 3 Encounters:  03/06/19 29.35 kg/m  12/19/18 28.46 kg/m  11/23/18 29.29 kg/m     Hep C Screening: done 07/2016  STD testing and prevention (HIV/chl/gon/syphilis): N/A Intimate partner violence: negative screen  Sexual History/Pain during Intercourse: not sexually active since 2014  Menstrual History/LMP/Abnormal Bleeding: post-menopausal, discussed post-menopausal bleeding  Incontinence Symptoms: she has symptoms or urgency no stress incontinence   Breast cancer:  - Last Mammogram: 12/2018 - BRCA gene screening: N/A  Osteoporosis Screening: ordered last year, she will try to schedule it   Cervical cancer screening: discussed USPTF repeat next year   Skin cancer: discussed atypical lesions  Colorectal cancer: last one in 2017 , family history of colon cancer, going every 5 years  Lung cancer:   Low Dose CT Chest recommended if Age 86-80 years, 30 pack-year  currently smoking OR have quit w/in 15years. Patient does qualify.  Currently down to 0.25 pack per day however has been smoking for over 39 years, and total pack of about 30 pack year history  ECG:03/2018   Advanced Care Planning: A voluntary discussion about advance care planning including the explanation and discussion of advance directives.  Discussed health care proxy and Living will, and the patient was able to identify a health care proxy as daughter  .  Patient does not have a living will at present time.  Lipids: Lab Results  Component Value Date   CHOL 147 10/27/2017   CHOL 238 (H) 02/14/2017   CHOL 136 08/10/2016   Lab Results  Component Value Date   HDL 44 (L) 10/27/2017    HDL 48 (L) 02/14/2017   HDL 39 (L) 08/10/2016   Lab Results  Component Value Date   LDLCALC 83 10/27/2017   LDLCALC 173 (H) 02/14/2017   LDLCALC 82 08/10/2016   Lab Results  Component Value Date   TRIG 100 10/27/2017   TRIG 87 02/14/2017   TRIG 73 08/10/2016   Lab Results  Component Value Date   CHOLHDL 3.3 10/27/2017   CHOLHDL 5.0 (H) 02/14/2017   CHOLHDL 3.5 08/10/2016   No results found for: LDLDIRECT  Glucose: Glucose  Date Value Ref Range Status  10/08/2013 91 65 - 99 mg/dL Final   Glucose, Bld  Date Value Ref Range Status  07/26/2018 79 70 - 99 mg/dL Final  04/10/2018 88 70 - 99 mg/dL Final  10/27/2017 73 65 - 139 mg/dL Final    Comment:    .        Non-fasting reference interval .     Patient Active Problem List   Diagnosis Date Noted  . PTSD (post-traumatic stress disorder) 01/29/2019  . Tobacco use disorder 01/29/2019  . MDD (major depressive disorder), recurrent episode, moderate ([place]) 11/23/2018  . Right renal stone 10/16/2018  . Renal cyst, left 10/16/2018  . Pre-diabetes 07/27/2018  . Cannabis use disorder, moderate, dependence ([place]) 07/26/2018  . TMJ (temporomandibular joint disorder) 02/07/2018  . Chronic angle-closure glaucoma of eye, left, mild stage 08/24/2017  . 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 Dry eye syndrome of both lacrimal glands 08/24/2017  . Age-related nuclear cataract of both eyes 08/24/2017  . Murmur, cardiac 12/16/2016  . Disorder of patellofemoral joint 12/16/2016  . Hidradenitis 12/16/2016  . Speech and language deficits 12/16/2016  . Vitamin D deficiency 12/16/2016  . Hypertension 10/11/2016  . Melanosis of colon   . Hyperlipidemia 09/15/2015  . Overweight (BMI 25.0-29.9) 06/17/2015  . Apnea, sleep 04/15/2015  . Asthma, mild intermittent 04/15/2015  . Cervical disc disease 04/15/2015  . Dysfunction of eustachian tube 04/15/2015  . Compulsive tobacco user syndrome 04/15/2015  . Anxiety and  depression 01/02/2015  . Chronic cervical pain 01/02/2015  . Insomnia due to medical condition 01/02/2015  . Female cystocele 07/31/2014  . Cervical spinal stenosis 07/05/2011    Past Surgical History:  Procedure Laterality Date  . ABDOMINAL HYSTERECTOMY    . ANTERIOR CERVICAL DECOMP/DISCECTOMY FUSION  07/05/2011   Procedure: ANTERIOR CERVICAL DECOMPRESSION/DISCECTOMY FUSION 3 LEVELS;  Surgeon: Cooper Render Pool;  Location: [place] [facility];  Service: Neurosurgery;  Laterality: N/A;  Cervical Four-Five, Cervical Five-Six, Cervical Six-Seven Anterior Cervical Decompression Fusion WITH ALLOGRAFT AND PLATING  . CESAREAN SECTION    . COLONOSCOPY WITH PROPOFOL N/A 04/16/2016   Procedure: COLONOSCOPY WITH PROPOFOL;  Surgeon: Lucilla Lame, MD;  Location: [place];  Service: Endoscopy;  Laterality: N/A;  sleep apnea LATEX sensitivity  . ESOPHAGOGASTRODUODENOSCOPY (EGD) WITH PROPOFOL N/A 01/12/2017   Procedure: ESOPHAGOGASTRODUODENOSCOPY (EGD) WITH PROPOFOL;  Surgeon: Jonathon Bellows, MD;  Location: [place];  Service: Endoscopy;  Laterality: N/A;  Latex sensitivity sleep apnea  . PARTIAL HYSTERECTOMY      Family History  Problem Relation Age of Onset  . Cancer Mother        colon  . Stroke Father   . Drug abuse Sister   . Alcohol abuse Sister   . Anesthesia problems Neg Hx   . Breast cancer Neg Hx     Social History   Socioeconomic History  . Marital status: Divorced    Spouse name: Not on file  . Number of children: 1  . Years of education: Not on file  . Highest education level: Associate degree: academic program  Occupational History  . Occupation: Disabled  Social Needs  . Financial resource strain: Somewhat hard  . Food insecurity    Worry: Sometimes true    Inability: Sometimes true  . Transportation needs    Medical: No    Non-medical: No  Tobacco Use  . Smoking status: Current Every Day Smoker    Packs/day: 0.75    Years: 40.00    Pack years: 30.00     Types: Cigarettes    Start date: 03/06/1979  . Smokeless tobacco: Never Used  Substance and Sexual Activity  . Alcohol use: Yes    Alcohol/week: 0.0 standard drinks    Comment: 1-2 beer a month(Holidays)  . Drug use: Not Currently    Types: Marijuana    Comment: daily  . Sexual activity: Not Currently    Partners: Female, Female    Birth control/protection: Condom  Lifestyle  . Physical activity    Days per week: 3 days    Minutes per session: 40 min  . Stress: To some extent  Relationships  . Social Herbalist on phone: Three times a week    Gets together: Never    Attends religious service: Never    Active member of club or organization: No    Attends meetings of clubs or organizations: Never    Relationship status: Divorced  . Intimate partner violence    Fear of current or ex partner: No    Emotionally abused: No    Physically abused: No    Forced sexual activity: No  Other Topics Concern  . Not on file  Social History Narrative   Moved to [place]     Current Outpatient Medications:  .  acetaminophen (TYLENOL) 325 MG tablet, Take 325 mg by mouth every 6 (six) hours as needed. For pain , Disp: , Rfl:  .  albuterol (VENTOLIN HFA) 108 (90 Base) MCG/ACT inhaler, Inhale 1 puff into the lungs every 4 (four) hours as needed. For shortness of breath, Disp: 18 g, Rfl: 0 .  cetirizine (ZYRTEC) 10 MG tablet, Take 1 tablet (10 mg total) by mouth daily., Disp: 90 tablet, Rfl: 3 .  clindamycin (CLEOCIN T) 1 % lotion, , Disp: , Rfl:  .  conjugated estrogens (PREMARIN) vaginal cream, Place vaginally at bedtime., Disp: 42.5 g, Rfl: 0 .  divalproex (DEPAKOTE ER) 500 MG 24 hr tablet, Take 2 tablets (1,000 mg total) by mouth at bedtime., Disp: 180 tablet, Rfl: 0 .  fluticasone (FLONASE) 50 MCG/ACT nasal spray, PLACE 2 SPRAYS INTO BOTH NOSTRILS DAILY. FOR CONGESTION, Disp: 48  mL, Rfl: 1 .  Fluticasone-Salmeterol (ADVAIR) 250-50 MCG/DOSE AEPB, Inhale 1 puff into the lungs every  12 (twelve) hours as needed. FOR WHEEZING, Disp: 180 each, Rfl: 0 .  hydrOXYzine (ATARAX/VISTARIL) 25 MG tablet, Take 1 tablet (25 mg total) by mouth 3 (three) times daily as needed for anxiety., Disp: 270 tablet, Rfl: 0 .  latanoprost (XALATAN) 0.005 % ophthalmic solution, Apply to eye., Disp: , Rfl:  .  lisinopril (ZESTRIL) 10 MG tablet, Take 1-2 tablets (10-20 mg total) by mouth 2 (two) times daily., Disp: 180 tablet, Rfl: 0 .  methylphenidate (METADATE ER) 20 MG ER tablet, Take by mouth., Disp: , Rfl:  .  methylphenidate (METADATE ER) 20 MG ER tablet, Take by mouth., Disp: , Rfl:  .  montelukast (SINGULAIR) 10 MG tablet, Take 1 tablet (10 mg total) by mouth at bedtime., Disp: 90 tablet, Rfl: 1 .  Multiple Vitamins-Minerals (MULTIPLE VITAMINS/WOMENS PO), Take 1 tablet by mouth daily., Disp: , Rfl:  .  rosuvastatin (CRESTOR) 20 MG tablet, Take 1 tablet (20 mg total) by mouth daily., Disp: 90 tablet, Rfl: 1 .  sertraline (ZOLOFT) 100 MG tablet, Take 1 tablet (100 mg total) by mouth daily., Disp: 90 tablet, Rfl: 0 .  timolol (BETIMOL) 0.5 % ophthalmic solution, Place 1 drop into both eyes every morning.  , Disp: , Rfl:  .  tiZANidine (ZANAFLEX) 4 MG tablet, Take 1 tablet (4 mg total) by mouth every 8 (eight) hours as needed., Disp: 90 tablet, Rfl: 0 .  methylphenidate (METADATE ER) 20 MG ER tablet, Take by mouth., Disp: , Rfl:  .  triamcinolone cream (KENALOG) 0.1 %, Apply 1 application topically 2 (two) times daily., Disp: 30 g, Rfl: 0  Allergies  Allergen Reactions  . Latex Rash    Irritation  (Condoms and gloves)     ROS  Constitutional: Negative for fever or weight change.  Respiratory: Negative for cough and shortness of breath.   Cardiovascular: Negative for chest pain or palpitations.  Gastrointestinal: Negative for abdominal pain, no bowel changes.  Musculoskeletal: Negative for gait problem or joint swelling.  Skin: Negative for rash.  Neurological: Negative for dizziness or  headache.  No other specific complaints in a complete review of systems (except as listed in HPI above).  Objective  Vitals:   03/06/19 1003 03/06/19 1004  BP: (!) 170/100 (!) 150/80  Pulse: (!) 54   Resp: 16   Temp: (!) 96.9 F (36.1 C)   TempSrc: Temporal   SpO2: 97%   Weight: 171 lb (77.6 kg)   Height: '5\' 4"'  (1.626 m)     Body mass index is 29.35 kg/m.  Physical Exam  Constitutional: Patient appears well-developed and well-nourished. No distress.  HENT: Head: Normocephalic and atraumatic. Ears: B TMs ok, no erythema or effusion; Nose: Nose normal. Mouth/Throat: Oropharynx is clear and moist. No oropharyngeal exudate.  Eyes: Conjunctivae and EOM are normal. Pupils are equal, round, and reactive to light. No scleral icterus.  Neck: Normal range of motion. Neck supple. No JVD present. No thyromegaly present.  Cardiovascular: Normal rate, regular rhythm and normal heart sounds.  No murmur heard. No BLE edema. Pulmonary/Chest: Effort normal and breath sounds normal. No respiratory distress. Abdominal: Soft. Bowel sounds are normal, no distension. There is no tenderness. no masses Breast: no lumps or masses, no nipple discharge or rashes FEMALE GENITALIA:  Not done RECTAL: not done Musculoskeletal: Normal range of motion, no joint effusions. No gross deformities Neurological: he is alert and oriented  to person, place, and time. No cranial nerve deficit. Coordination, balance, strength, speech and gait are normal.  Skin: Skin is warm and dry. Erythematous circular rash on left abdominal fold, likely fungal ( advised otc medication)  Also has violaceous lesion well demarcated on two spots on left lower quadrant and smaller on on lateral aspect of chest wall, no vesicles or oozing, previous scars present on abdomen from previous eruptions  Psychiatric: Patient has a normal mood and affect. behavior is normal. Judgment and thought content normal.  Fall Risk: Fall Risk  03/06/2019  03/06/2019 12/19/2018 11/23/2018 11/23/2018  Falls in the past year? 0 0 0 0 0  Number falls in past yr: 0 0 0 0 0  Injury with Fall? 0 0 0 0 0  Risk for fall due to : - - - - -  Risk for fall due to: Comment - - - - -    Functional Status Survey: Is the patient deaf or have difficulty hearing?: Yes Does the patient have difficulty seeing, even when wearing glasses/contacts?: Yes Does the patient have difficulty concentrating, remembering, or making decisions?: Yes Does the patient have difficulty walking or climbing stairs?: Yes Does the patient have difficulty dressing or bathing?: Yes Does the patient have difficulty doing errands alone such as visiting a doctor's office or shopping?: Yes   Assessment & Plan   1. Well adult exam   2. Essential hypertension  - lisinopril (ZESTRIL) 10 MG tablet; Take 1-2 tablets (10-20 mg total) by mouth 2 (two) times daily.  Dispense: 180 tablet; Refill: 0  3. Moderate persistent asthma with allergic rhinitis without complication  - Fluticasone-Salmeterol (ADVAIR) 250-50 MCG/DOSE AEPB; Inhale 1 puff into the lungs every 12 (twelve) hours as needed. FOR WHEEZING  Dispense: 180 each; Refill: 0  4. Pre-diabetes   5. Hyperglycemia  Recheck A1 C  6. MDD (major depressive disorder), recurrent episode, moderate (H[facility])  Seeing Dr. Shea Evans   7. Need for immunization against influenza  - Flu Vaccine QUAD 36+ mos IM  8. Environmental and seasonal allergies  - montelukast (SINGULAIR) 10 MG tablet; Take 1 tablet (10 mg total) by mouth at bedtime.  Dispense: 90 tablet; Refill: 1  9. Pure hypercholesterolemia  - rosuvastatin (CRESTOR) 20 MG tablet; Take 1 tablet (20 mg total) by mouth daily.  Dispense: 90 tablet; Refill: 1  10. Dermatitis due to drug reaction  Abdominal area, stopped capsules  11. Chronic cervical pain  - tiZANidine (ZANAFLEX) 4 MG tablet; Take 1 tablet (4 mg total) by mouth every 8 (eight) hours as needed.  Dispense: 90  tablet; Refill: 0  12. Mild cognitive impairment  Seeing Dr Manuella Ghazi   13. COPD with asthma ([place])  Continue medications, discussed quitting smoking   14. Need for vaccination for pneumococcus  - Pneumococcal conjugate vaccine 13-valent IM  15. Encounter for screening for lung cancer  - CT CHEST LUNG CA SCREEN LOW DOSE W/O CM; Future  16. Tobacco use  - CT CHEST LUNG CA SCREEN LOW DOSE W/O CM; Future  Spoke to patient about split visit today and may have a balance associated with visit   -USPSTF grade A and B recommendations reviewed with patient; age-appropriate recommendations, preventive care, screening tests, etc discussed and encouraged; healthy living encouraged; see AVS for patient education given to patient -Discussed importance of 150 minutes of physical activity weekly, eat two servings of fish weekly, eat one serving of tree nuts ( cashews, pistachios, pecans, almonds.Marland Kitchen) every other day, eat 6  servings of fruit/vegetables daily and drink plenty of water and avoid sweet beverages.   -Reviewed Health Maintenance: schedule bone density

## 2019-03-06 NOTE — Patient Instructions (Signed)
Movies we discussed and you asked me to write it down:  13 th I am not your Negro   Preventive Care 95-61 Years Old, Female Preventive care refers to visits with your health care provider and lifestyle choices that can promote health and wellness. This includes:  A yearly physical exam. This may also be called an annual well check.  Regular dental visits and eye exams.  Immunizations.  Screening for certain conditions.  Healthy lifestyle choices, such as eating a healthy diet, getting regular exercise, not using drugs or products that contain nicotine and tobacco, and limiting alcohol use. What can I expect for my preventive care visit? Physical exam Your health care provider will check your:  Height and weight. This may be used to calculate body mass index (BMI), which tells if you are at a healthy weight.  Heart rate and blood pressure.  Skin for abnormal spots. Counseling Your health care provider may ask you questions about your:  Alcohol, tobacco, and drug use.  Emotional well-being.  Home and relationship well-being.  Sexual activity.  Eating habits.  Work and work Statistician.  Method of birth control.  Menstrual cycle.  Pregnancy history. What immunizations do I need?  Influenza (flu) vaccine  This is recommended every year. Tetanus, diphtheria, and pertussis (Tdap) vaccine  You may need a Td booster every 10 years. Varicella (chickenpox) vaccine  You may need this if you have not been vaccinated. Zoster (shingles) vaccine  You may need this after age 34. Measles, mumps, and rubella (MMR) vaccine  You may need at least one dose of MMR if you were born in 1957 or later. You may also need a second dose. Pneumococcal conjugate (PCV13) vaccine  You may need this if you have certain conditions and were not previously vaccinated. Pneumococcal polysaccharide (PPSV23) vaccine  You may need one or two doses if you smoke cigarettes or if you have  certain conditions. Meningococcal conjugate (MenACWY) vaccine  You may need this if you have certain conditions. Hepatitis A vaccine  You may need this if you have certain conditions or if you travel or work in places where you may be exposed to hepatitis A. Hepatitis B vaccine  You may need this if you have certain conditions or if you travel or work in places where you may be exposed to hepatitis B. Haemophilus influenzae type b (Hib) vaccine  You may need this if you have certain conditions. Human papillomavirus (HPV) vaccine  If recommended by your health care provider, you may need three doses over 6 months. You may receive vaccines as individual doses or as more than one vaccine together in one shot (combination vaccines). Talk with your health care provider about the risks and benefits of combination vaccines. What tests do I need? Blood tests  Lipid and cholesterol levels. These may be checked every 5 years, or more frequently if you are over 69 years old.  Hepatitis C test.  Hepatitis B test. Screening  Lung cancer screening. You may have this screening every year starting at age 42 if you have a 30-pack-year history of smoking and currently smoke or have quit within the past 15 years.  Colorectal cancer screening. All adults should have this screening starting at age 59 and continuing until age 37. Your health care provider may recommend screening at age 36 if you are at increased risk. You will have tests every 1-10 years, depending on your results and the type of screening test.  Diabetes screening. This  is done by checking your blood sugar (glucose) after you have not eaten for a while (fasting). You may have this done every 1-3 years.  Mammogram. This may be done every 1-2 years. Talk with your health care provider about when you should start having regular mammograms. This may depend on whether you have a family history of breast cancer.  BRCA-related cancer  screening. This may be done if you have a family history of breast, ovarian, tubal, or peritoneal cancers.  Pelvic exam and Pap test. This may be done every 3 years starting at age 49. Starting at age 49, this may be done every 5 years if you have a Pap test in combination with an HPV test. Other tests  Sexually transmitted disease (STD) testing.  Bone density scan. This is done to screen for osteoporosis. You may have this scan if you are at high risk for osteoporosis. Follow these instructions at home: Eating and drinking  Eat a diet that includes fresh fruits and vegetables, whole grains, lean protein, and low-fat dairy.  Take vitamin and mineral supplements as recommended by your health care provider.  Do not drink alcohol if: ? Your health care provider tells you not to drink. ? You are pregnant, may be pregnant, or are planning to become pregnant.  If you drink alcohol: ? Limit how much you have to 0-1 drink a day. ? Be aware of how much alcohol is in your drink. In the [place] one drink equals one 12 oz bottle of beer (355 mL), one 5 oz glass of wine (148 mL), or one 1 oz glass of hard liquor (44 mL). Lifestyle  Take daily care of your teeth and gums.  Stay active. Exercise for at least 30 minutes on 5 or more days each week.  Do not use any products that contain nicotine or tobacco, such as cigarettes, e-cigarettes, and chewing tobacco. If you need help quitting, ask your health care provider.  If you are sexually active, practice safe sex. Use a condom or other form of birth control (contraception) in order to prevent pregnancy and STIs (sexually transmitted infections).  If told by your health care provider, take low-dose aspirin daily starting at age 68. What's next?  Visit your health care provider once a year for a well check visit.  Ask your health care provider how often you should have your eyes and teeth checked.  Stay up to date on all vaccines. This  information is not intended to replace advice given to you by your health care provider. Make sure you discuss any questions you have with your health care provider. Document Released: 07/04/2015 Document Revised: 02/16/2018 Document Reviewed: 02/16/2018 Elsevier Patient Education  2020 [employer].

## 2019-03-06 NOTE — Telephone Encounter (Signed)
Received referral for initial lung cancer screening scan. Contacted patient and obtained smoking history,(current, 30 pack year) as well as answering questions related to screening process. Patient denies signs of lung cancer such as weight loss or hemoptysis. Patient denies comorbidity that would prevent curative treatment if lung cancer were found. Patient is scheduled for shared decision making visit and CT scan on 03/13/19 at 145pm.

## 2019-03-07 LAB — HEMOGLOBIN A1C
Hgb A1c MFr Bld: 5.6 % of total Hgb (ref <5.7)
Mean Plasma Glucose: 114 (calc)
eAG (mmol/L): 6.3 (calc)

## 2019-03-07 LAB — CBC WITH DIFFERENTIAL/PLATELET
Absolute Monocytes: 539 cells/uL (ref 200–950)
Basophils Absolute: 29 cells/uL (ref 0–200)
Basophils Relative: 0.3 %
Eosinophils Absolute: 20 cells/uL (ref 15–500)
Eosinophils Relative: 0.2 %
HCT: 40.8 % (ref 35.0–45.0)
Hemoglobin: 13.7 g/dL (ref 11.7–15.5)
Lymphs Abs: 3195 cells/uL (ref 850–3900)
MCH: 31.3 pg (ref 27.0–33.0)
MCHC: 33.6 g/dL (ref 32.0–36.0)
MCV: 93.2 fL (ref 80.0–100.0)
MPV: 11.2 fL (ref 7.5–12.5)
Monocytes Relative: 5.5 %
Neutro Abs: 6017 cells/uL (ref 1500–7800)
Neutrophils Relative %: 61.4 %
Platelets: 223 10*3/uL (ref 140–400)
RBC: 4.38 10*6/uL (ref 3.80–5.10)
RDW: 15 % (ref 11.0–15.0)
Total Lymphocyte: 32.6 %
WBC: 9.8 10*3/uL (ref 3.8–10.8)

## 2019-03-07 LAB — COMPLETE METABOLIC PANEL WITH GFR
AG Ratio: 1.6 (calc) (ref 1.0–2.5)
ALT: 18 U/L (ref 6–29)
AST: 16 U/L (ref 10–35)
Albumin: 4.2 g/dL (ref 3.6–5.1)
Alkaline phosphatase (APISO): 78 U/L (ref 37–153)
BUN: 12 mg/dL (ref 7–25)
CO2: 27 mmol/L (ref 20–32)
Calcium: 9.3 mg/dL (ref 8.6–10.4)
Chloride: 106 mmol/L (ref 98–110)
Creat: 0.81 mg/dL (ref 0.50–0.99)
GFR, Est African American: 91 mL/min/{1.73_m2} (ref >=60)
GFR, Est Non African American: 79 mL/min/{1.73_m2} (ref >=60)
Globulin: 2.6 g/dL (calc) (ref 1.9–3.7)
Glucose, Bld: 87 mg/dL (ref 65–99)
Potassium: 3.8 mmol/L (ref 3.5–5.3)
Sodium: 139 mmol/L (ref 135–146)
Total Bilirubin: 0.5 mg/dL (ref 0.2–1.2)
Total Protein: 6.8 g/dL (ref 6.1–8.1)

## 2019-03-07 LAB — LIPID PANEL
Cholesterol: 156 mg/dL (ref <200)
HDL: 51 mg/dL (ref >=50)
LDL Cholesterol (Calc): 82 mg/dL (calc)
Non-HDL Cholesterol (Calc): 105 mg/dL (calc) (ref <130)
Total CHOL/HDL Ratio: 3.1 (calc) (ref <5.0)
Triglycerides: 131 mg/dL (ref <150)

## 2019-03-08 ENCOUNTER — Encounter: Payer: Self-pay | Admitting: Family Medicine

## 2019-03-08 ENCOUNTER — Other Ambulatory Visit: Payer: Self-pay

## 2019-03-08 NOTE — Patient Outreach (Signed)
[facility]) Care Management  03/08/2019  Janice Wilson 06-Jan-1956 [id number]  Medication Adherence call to Janice Wilson [facility] Identifiers Verify spoke with patient she is past due on Lisinopril 10 mg patient explain she take 1 tablet 2 times a day patient explain she is the process of switching pharmacy and will take her prescription to a pharmacy that is close to her home she wants to continue using [facility]. Janice Wilson is showing past due under [place].   [place] Management Direct Dial [phone]  Fax [phone] [email or web address]

## 2019-03-11 ENCOUNTER — Other Ambulatory Visit: Payer: Self-pay | Admitting: Psychiatry

## 2019-03-11 DIAGNOSIS — F331 Major depressive disorder, recurrent, moderate: Secondary | ICD-10-CM

## 2019-03-11 DIAGNOSIS — F4310 Post-traumatic stress disorder, unspecified: Secondary | ICD-10-CM

## 2019-03-12 ENCOUNTER — Ambulatory Visit: Payer: Medicare Other | Attending: Neurology | Admitting: Speech Pathology

## 2019-03-13 ENCOUNTER — Other Ambulatory Visit: Payer: Self-pay | Admitting: Family Medicine

## 2019-03-13 ENCOUNTER — Inpatient Hospital Stay: Payer: Medicare Other | Admitting: Oncology

## 2019-03-13 ENCOUNTER — Ambulatory Visit: Payer: Medicare Other

## 2019-03-13 DIAGNOSIS — L309 Dermatitis, unspecified: Secondary | ICD-10-CM | POA: Diagnosis not present

## 2019-03-13 DIAGNOSIS — E7800 Pure hypercholesterolemia, unspecified: Secondary | ICD-10-CM

## 2019-03-13 NOTE — Telephone Encounter (Signed)
Requested medication (s) are due for refill today: yes  Requested medication (s) are on the active medication list: yes  Last refill:  01/19/2019  Future visit scheduled:yes  Notes to clinic:  Review for refill    Requested Prescriptions  Pending Prescriptions Disp Refills   meloxicam (MOBIC) 15 MG tablet [Pharmacy Med Name: MELOXICAM 15 MG TABLET] 30 tablet 0    Sig: TAKE 1 TABLET BY MOUTH EVERY DAY     Analgesics:  COX2 Inhibitors Passed - 03/13/2019  9:44 AM      Passed - HGB in normal range and within 360 days    Hemoglobin  Date Value Ref Range Status  03/06/2019 13.7 11.7 - 15.5 g/dL Final  01/29/2015 13.8 11.1 - 15.9 g/dL Final         Passed - Cr in normal range and within 360 days    Creat  Date Value Ref Range Status  03/06/2019 0.81 0.50 - 0.99 mg/dL Final    Comment:    For patients >76 years of age, the reference limit for Creatinine is approximately 13% higher for people identified as African-American. Renella Cunas - Patient is not pregnant      Passed - Valid encounter within last 12 months    Recent Outpatient Visits          1 week ago MDD (major depressive disorder), recurrent episode, moderate ([place])   [facility] Steele Sizer, MD   3 months ago Essential hypertension   [facility] Steele Sizer, MD   4 months ago Essential hypertension   [facility] [place], Drue Stager, MD   7 months ago MDD (major depressive disorder), recurrent episode, moderate [facility])   [facility] [place], Drue Stager, MD   10 months ago Moderate episode of recurrent major depressive disorder [facility])   [facility] Steele Sizer, MD      Future Appointments            In 2 months Ancil Boozer, Drue Stager, MD [facility], [facility]

## 2019-03-15 ENCOUNTER — Encounter: Payer: Medicare Other | Admitting: Speech Pathology

## 2019-03-19 ENCOUNTER — Encounter: Payer: Medicare Other | Admitting: Speech Pathology

## 2019-03-21 ENCOUNTER — Inpatient Hospital Stay: Payer: Medicare Other | Attending: Oncology | Admitting: Oncology

## 2019-03-21 DIAGNOSIS — Z87891 Personal history of nicotine dependence: Secondary | ICD-10-CM

## 2019-03-21 NOTE — Progress Notes (Signed)
Virtual Visit via Video Note  I connected with Janice Wilson on 03/21/19 at  9:45 AM EDT by a video enabled telemedicine application and verified that I am speaking with the correct person using two identifiers.  Location: Patient: Home Provider: Office   I discussed the limitations of evaluation and management by telemedicine and the availability of in person appointments. The patient expressed understanding and agreed to proceed.  I discussed the assessment and treatment plan with the patient. The patient was provided an opportunity to ask questions and all were answered. The patient agreed with the plan and demonstrated an understanding of the instructions.   The patient was advised to call back or seek an in-person evaluation if the symptoms worsen or if the condition fails to improve as anticipated.   In accordance with CMS guidelines, patient has met eligibility criteria including age, absence of signs or symptoms of lung cancer.  Social History   Tobacco Use  . Smoking status: Current Every Day Smoker    Packs/day: 0.75    Years: 40.00    Pack years: 30.00    Types: Cigarettes    Start date: 03/06/1979  . Smokeless tobacco: Never Used  Substance Use Topics  . Alcohol use: Yes    Alcohol/week: 0.0 standard drinks    Comment: 1-2 beer a month(Holidays)  . Drug use: Not Currently    Types: Marijuana    Comment: daily      A shared decision-making session was conducted prior to the performance of CT scan. This includes one or more decision aids, includes benefits and harms of screening, follow-up diagnostic testing, over-diagnosis, false positive rate, and total radiation exposure.   Counseling on the importance of adherence to annual lung cancer LDCT screening, impact of co-morbidities, and ability or willingness to undergo diagnosis and treatment is imperative for compliance of the program.   Counseling on the importance of continued smoking cessation for former smokers;  the importance of smoking cessation for current smokers, and information about tobacco cessation interventions have been given to patient including Janice Wilson and [employer] quit [place] programs.   Written order for lung cancer screening with LDCT has been given to the patient and any and all questions have been answered to the best of my abilities.    Yearly follow up will be coordinated by Burgess Estelle, Thoracic Navigator.  I provided 15 minutes of face-to-face video visit time during this encounter, and > 50% was spent counseling as documented under my assessment & plan.   Jacquelin Hawking, NP

## 2019-03-22 ENCOUNTER — Other Ambulatory Visit: Payer: Self-pay

## 2019-03-22 ENCOUNTER — Encounter: Payer: Medicare Other | Admitting: Speech Pathology

## 2019-03-22 ENCOUNTER — Ambulatory Visit
Admission: RE | Admit: 2019-03-22 | Discharge: 2019-03-22 | Disposition: A | Discharge status: Home / Self Care | Payer: Medicare Other | Source: Ambulatory Visit | Attending: Nurse Practitioner | Admitting: Nurse Practitioner

## 2019-03-22 ENCOUNTER — Ambulatory Visit: Payer: Self-pay

## 2019-03-22 ENCOUNTER — Encounter: Payer: Self-pay | Admitting: Family Medicine

## 2019-03-22 DIAGNOSIS — Z87891 Personal history of nicotine dependence: Secondary | ICD-10-CM | POA: Diagnosis not present

## 2019-03-22 DIAGNOSIS — I700 Atherosclerosis of aorta: Secondary | ICD-10-CM | POA: Insufficient documentation

## 2019-03-22 DIAGNOSIS — J432 Centrilobular emphysema: Secondary | ICD-10-CM | POA: Insufficient documentation

## 2019-03-22 DIAGNOSIS — Z122 Encounter for screening for malignant neoplasm of respiratory organs: Secondary | ICD-10-CM | POA: Insufficient documentation

## 2019-03-23 ENCOUNTER — Ambulatory Visit: Payer: Self-pay

## 2019-03-26 ENCOUNTER — Encounter: Payer: Self-pay | Admitting: *Deleted

## 2019-03-26 ENCOUNTER — Encounter: Payer: Self-pay | Admitting: Family Medicine

## 2019-03-26 ENCOUNTER — Encounter: Payer: Medicare Other | Admitting: Speech Pathology

## 2019-03-29 ENCOUNTER — Encounter: Payer: Medicare Other | Admitting: Speech Pathology

## 2019-04-02 ENCOUNTER — Encounter: Payer: Medicare Other | Admitting: Speech Pathology

## 2019-04-03 ENCOUNTER — Ambulatory Visit: Payer: Medicare Other | Admitting: Psychiatry

## 2019-04-05 ENCOUNTER — Encounter: Payer: Medicare Other | Admitting: Speech Pathology

## 2019-04-06 ENCOUNTER — Other Ambulatory Visit: Payer: Self-pay | Admitting: Family Medicine

## 2019-04-06 DIAGNOSIS — I10 Essential (primary) hypertension: Secondary | ICD-10-CM

## 2019-04-09 ENCOUNTER — Encounter: Payer: Medicare Other | Admitting: Speech Pathology

## 2019-04-12 ENCOUNTER — Encounter: Payer: Medicare Other | Admitting: Speech Pathology

## 2019-04-13 DIAGNOSIS — L309 Dermatitis, unspecified: Secondary | ICD-10-CM | POA: Diagnosis not present

## 2019-04-26 ENCOUNTER — Encounter: Payer: Medicare Other | Admitting: Speech Pathology

## 2019-04-27 ENCOUNTER — Other Ambulatory Visit: Payer: Self-pay | Admitting: Family Medicine

## 2019-04-27 NOTE — Telephone Encounter (Signed)
Requested medication (s) are due for refill today: no  Requested medication (s) are on the active medication list: yes  Last refill:  04/09/2019  Future visit scheduled: yes  Notes to clinic:  One inhaler should last one month Review for refill   Requested Prescriptions  Pending Prescriptions Disp Refills   albuterol (VENTOLIN HFA) 108 (90 Base) MCG/ACT inhaler [Pharmacy Med Name: ALBUTEROL HFA (PROAIR) INHALER]      Sig: INHALE 1 PUFF INTO THE LUNGS EVERY 4 HOURS AS NEEDED FOR SHORTNESS OF BREATH     Pulmonology:  Beta Agonists Failed - 04/27/2019  1:14 AM      Failed - One inhaler should last at least one month. If the patient is requesting refills earlier, contact the patient to check for uncontrolled symptoms.      Passed - Valid encounter within last 12 months    Recent Outpatient Visits          1 month ago MDD (major depressive disorder), recurrent episode, moderate ([place])   [facility] Steele Sizer, MD   5 months ago Essential hypertension   [facility] Steele Sizer, MD   6 months ago Essential hypertension   [facility] [place], Drue Stager, MD   9 months ago MDD (major depressive disorder), recurrent episode, moderate [facility])   [facility] [place], Drue Stager, MD   1 year ago Moderate episode of recurrent major depressive disorder [facility])   [facility] Steele Sizer, MD      Future Appointments            In 1 month Steele Sizer, MD [facility], [facility]

## 2019-04-30 ENCOUNTER — Encounter: Payer: Medicare Other | Admitting: Speech Pathology

## 2019-05-03 ENCOUNTER — Encounter: Payer: Medicare Other | Admitting: Speech Pathology

## 2019-05-07 ENCOUNTER — Encounter: Payer: Medicare Other | Admitting: Speech Pathology

## 2019-05-10 ENCOUNTER — Other Ambulatory Visit: Payer: Self-pay | Admitting: Family Medicine

## 2019-05-10 ENCOUNTER — Encounter: Payer: Medicare Other | Admitting: Speech Pathology

## 2019-05-10 DIAGNOSIS — I10 Essential (primary) hypertension: Secondary | ICD-10-CM

## 2019-05-14 ENCOUNTER — Encounter: Payer: Medicare Other | Admitting: Speech Pathology

## 2019-05-16 ENCOUNTER — Other Ambulatory Visit: Payer: Self-pay | Admitting: Family Medicine

## 2019-05-16 DIAGNOSIS — R6884 Jaw pain: Secondary | ICD-10-CM

## 2019-06-05 ENCOUNTER — Ambulatory Visit: Payer: Medicare Other | Admitting: Family Medicine

## 2019-06-07 ENCOUNTER — Other Ambulatory Visit: Payer: Self-pay | Admitting: Family Medicine

## 2019-06-30 ENCOUNTER — Other Ambulatory Visit: Payer: Self-pay | Admitting: Family Medicine

## 2019-06-30 DIAGNOSIS — I10 Essential (primary) hypertension: Secondary | ICD-10-CM

## 2019-07-03 ENCOUNTER — Other Ambulatory Visit: Payer: Self-pay | Admitting: Psychiatry

## 2019-07-03 DIAGNOSIS — F4310 Post-traumatic stress disorder, unspecified: Secondary | ICD-10-CM

## 2019-07-03 DIAGNOSIS — F331 Major depressive disorder, recurrent, moderate: Secondary | ICD-10-CM

## 2019-07-05 IMAGING — MG MM DIGITAL SCREENING BILAT W/ TOMO W/ CAD
9 of 12 series · 9 of 28 positions shown · non-contrast
Comparison: Previous exam(s).

CLINICAL DATA: Screening.

EXAM:
DIGITAL SCREENING BILATERAL MAMMOGRAM WITH TOMO AND CAD

[L CC synth-2D]
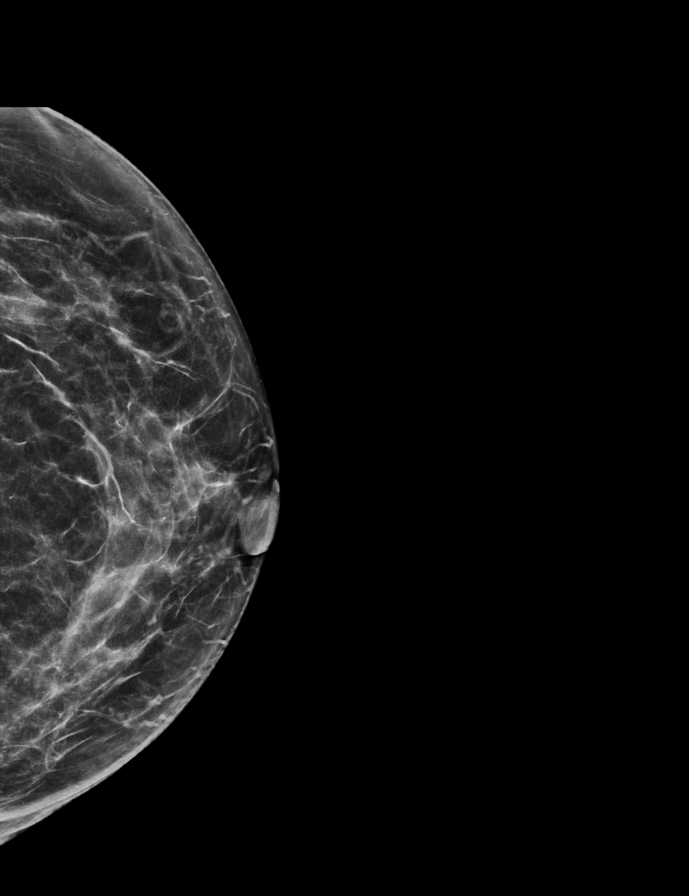

[R MLO]
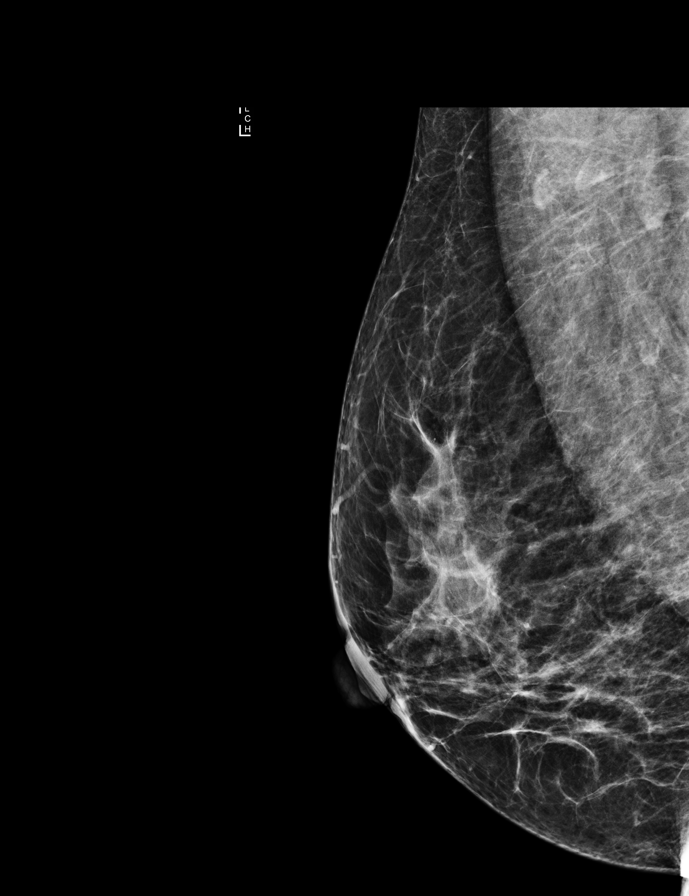

[R MLO synth-2D]
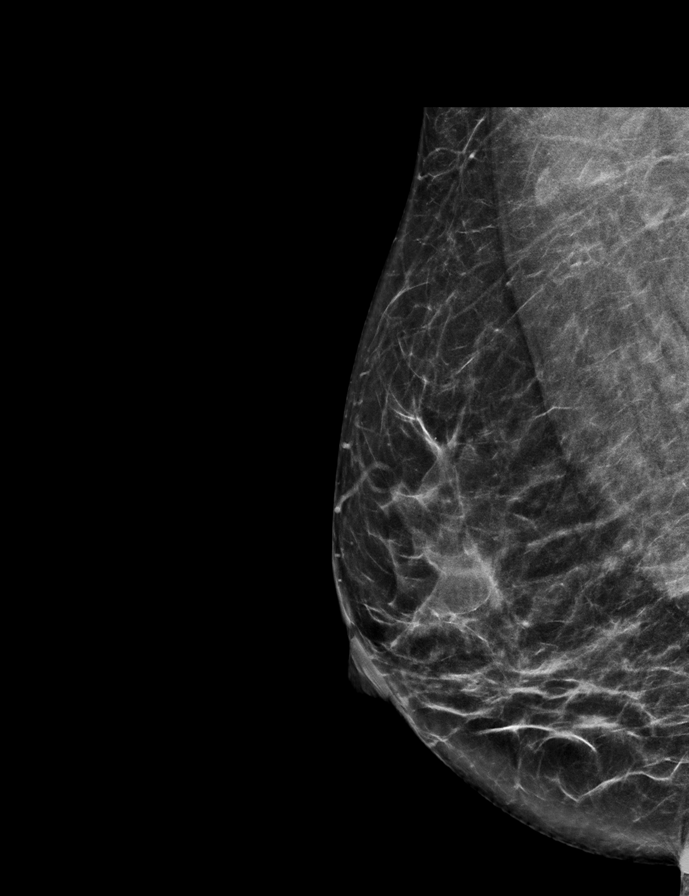

[L CC]
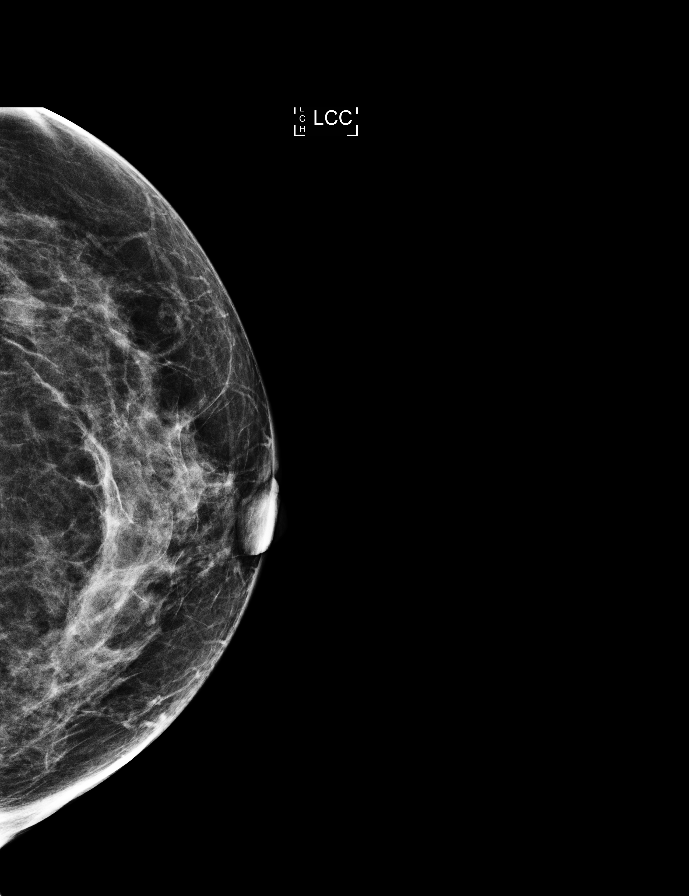

[L MLO]
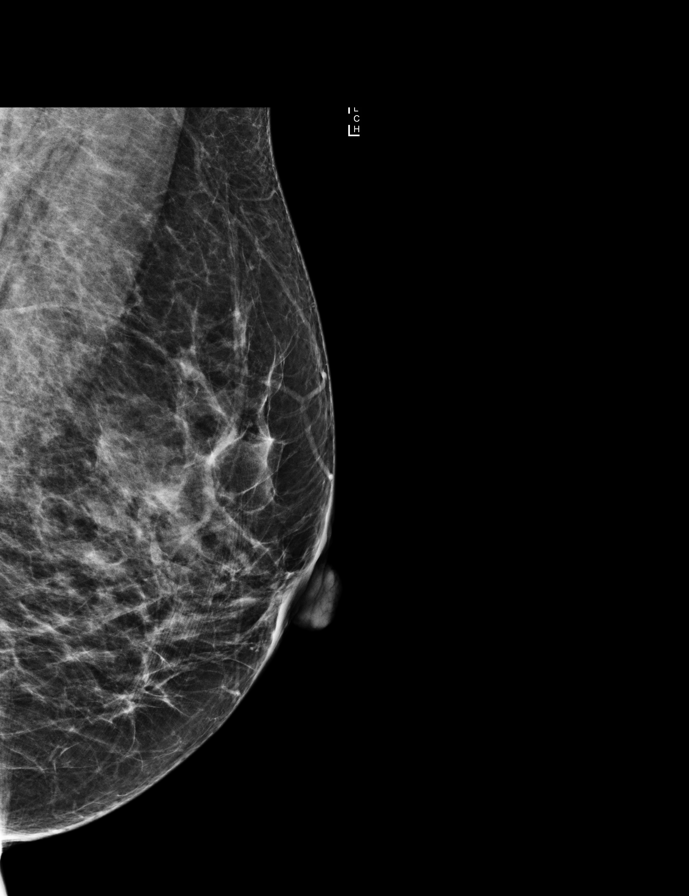

[R CC synth-2D]
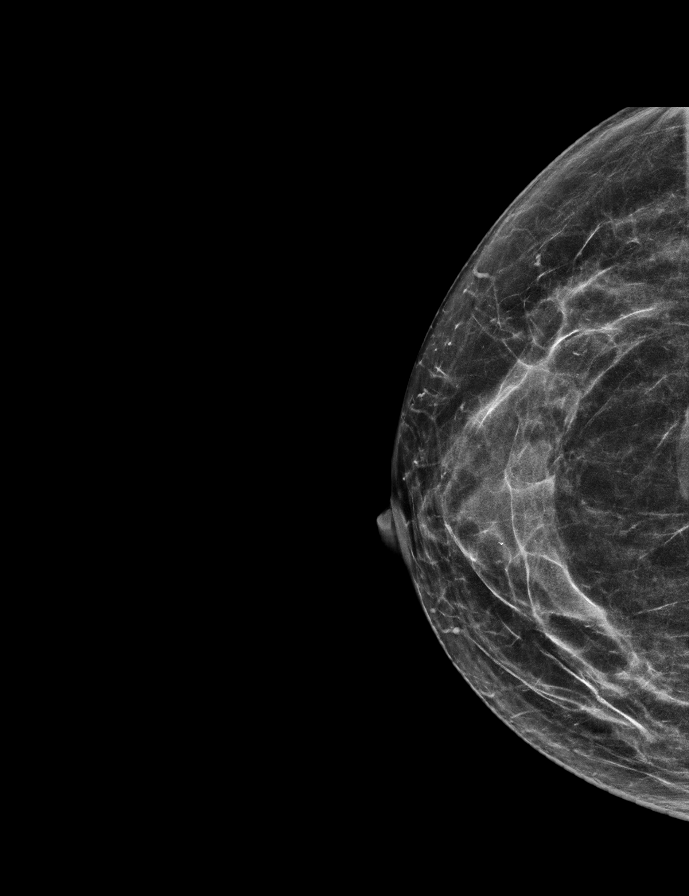

[R CC]
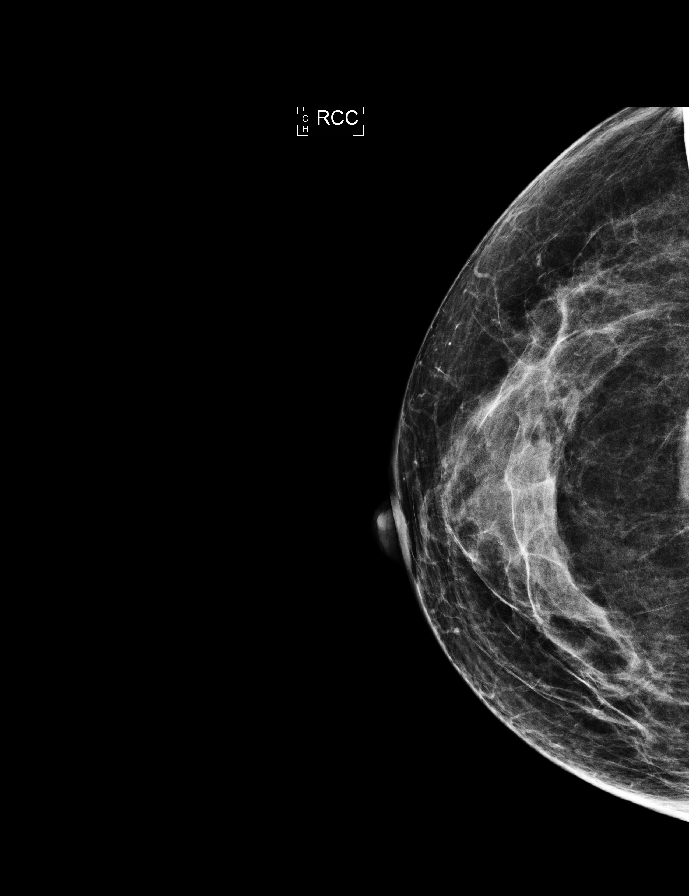

[L MLO synth-2D]
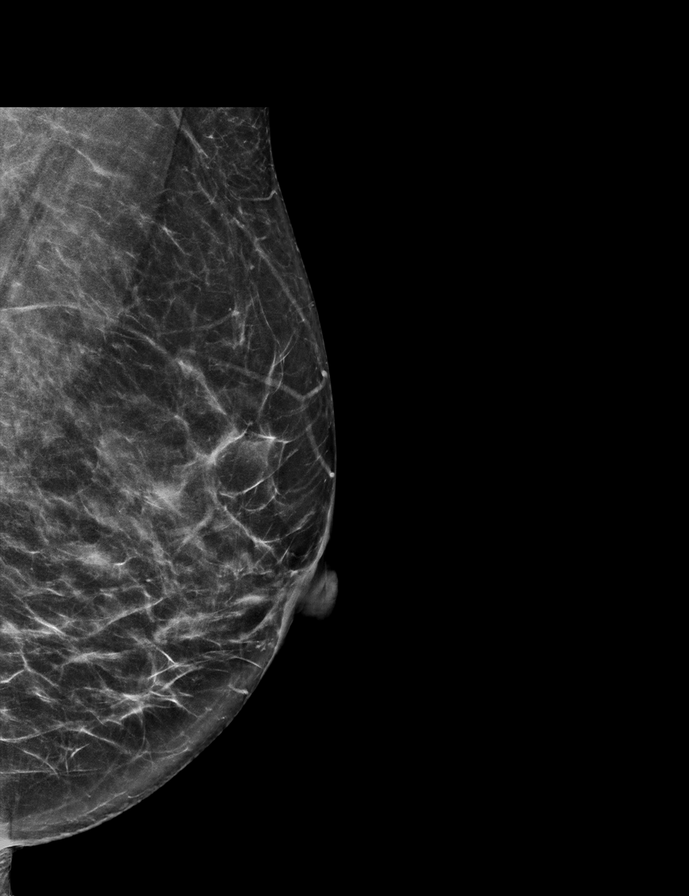

[L MLO tomo · tomo slice 33/64.0]
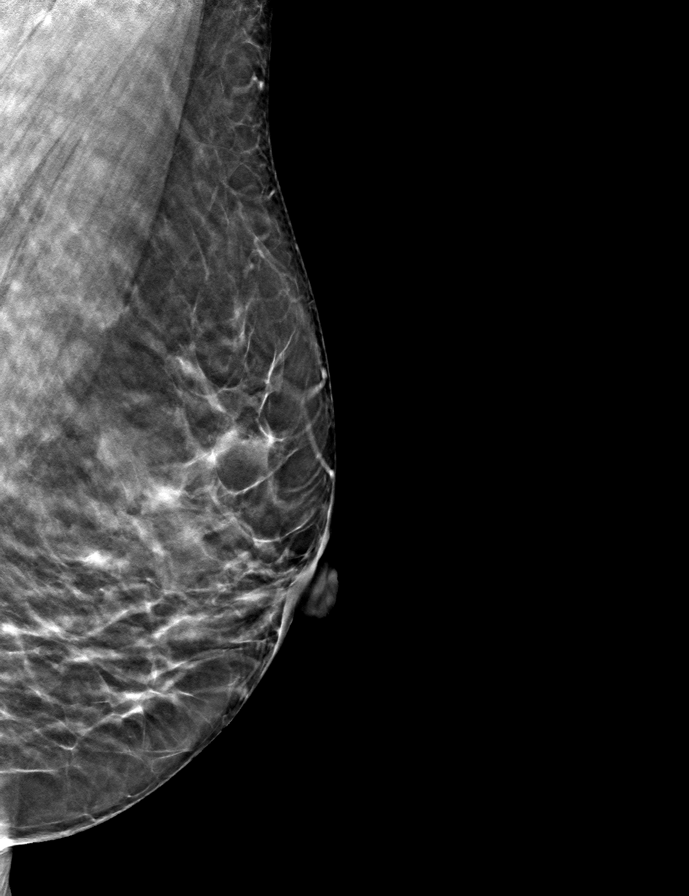

[9 of 28 positions shown; findings below may reference images not displayed]

ACR Breast Density Category c: The breast tissue is heterogeneously
dense, which may obscure small masses.
FINDINGS: There are no findings suspicious for malignancy. Images were
processed with CAD.
IMPRESSION: No mammographic evidence of malignancy. A result letter of this
screening mammogram will be mailed directly to the patient.

RECOMMENDATION:
Screening mammogram in one year. (Code:[id number])

BI-RADS CATEGORY  1: Negative.

## 2019-07-12 DIAGNOSIS — G4733 Obstructive sleep apnea (adult) (pediatric): Secondary | ICD-10-CM | POA: Diagnosis not present

## 2019-07-14 ENCOUNTER — Other Ambulatory Visit: Payer: Self-pay | Admitting: Family Medicine

## 2019-07-14 DIAGNOSIS — I10 Essential (primary) hypertension: Secondary | ICD-10-CM

## 2019-07-14 NOTE — Telephone Encounter (Signed)
Requested Prescriptions  Pending Prescriptions Disp Refills  . lisinopril (ZESTRIL) 10 MG tablet [Pharmacy Med Name: LISINOPRIL 10 MG TABLET] 180 tablet 1    Sig: TAKE 1 TO 2 TABLETS BY MOUTH DAILY     Cardiovascular:  ACE Inhibitors Failed - 07/14/2019 11:09 AM      Failed - Last BP in normal range    BP Readings from Last 1 Encounters:  03/06/19 (!) 179/110         Passed - Cr in normal range and within 180 days    Creat  Date Value Ref Range Status  03/06/2019 0.81 0.50 - 0.99 mg/dL Final    Comment:    For patients >16 years of age, the reference limit for Creatinine is approximately 13% higher for people identified as African-American. .          Passed - K in normal range and within 180 days    Potassium  Date Value Ref Range Status  03/06/2019 3.8 3.5 - 5.3 mmol/L Final  10/08/2013 3.7 3.5 - 5.1 mmol/L Final         Passed - Patient is not pregnant      Passed - Valid encounter within last 6 months    Recent Outpatient Visits          4 months ago MDD (major depressive disorder), recurrent episode, moderate ([place])   [facility] Steele Sizer, MD   7 months ago Essential hypertension   [facility] Steele Sizer, MD   8 months ago Essential hypertension   [facility] [place], Drue Stager, MD   11 months ago MDD (major depressive disorder), recurrent episode, moderate [facility])   [facility], Drue Stager, MD   1 year ago Moderate episode of recurrent major depressive disorder [facility])   [facility] Steele Sizer, MD

## 2019-07-30 ENCOUNTER — Other Ambulatory Visit: Payer: Self-pay | Admitting: Family Medicine

## 2019-08-11 ENCOUNTER — Other Ambulatory Visit: Payer: Self-pay | Admitting: Psychiatry

## 2019-08-11 DIAGNOSIS — F331 Major depressive disorder, recurrent, moderate: Secondary | ICD-10-CM

## 2019-08-11 DIAGNOSIS — F4310 Post-traumatic stress disorder, unspecified: Secondary | ICD-10-CM

## 2019-08-12 DIAGNOSIS — G4733 Obstructive sleep apnea (adult) (pediatric): Secondary | ICD-10-CM | POA: Diagnosis not present

## 2019-08-15 ENCOUNTER — Other Ambulatory Visit: Payer: Self-pay | Admitting: Psychiatry

## 2019-08-15 ENCOUNTER — Other Ambulatory Visit: Payer: Self-pay

## 2019-08-15 DIAGNOSIS — F4310 Post-traumatic stress disorder, unspecified: Secondary | ICD-10-CM

## 2019-08-15 DIAGNOSIS — L309 Dermatitis, unspecified: Secondary | ICD-10-CM | POA: Diagnosis not present

## 2019-08-15 DIAGNOSIS — F331 Major depressive disorder, recurrent, moderate: Secondary | ICD-10-CM

## 2019-08-15 DIAGNOSIS — E7800 Pure hypercholesterolemia, unspecified: Secondary | ICD-10-CM

## 2019-08-15 NOTE — Telephone Encounter (Signed)
Refill Request for Cholesterol medication.  [facility]atin [facility]  Last visit 03/06/2019  Lab Results  Component Value Date   CHOL 156 03/06/2019   HDL 51 03/06/2019   LDLCALC 82 03/06/2019   TRIG 131 03/06/2019   CHOLHDL 3.1 03/06/2019    Follow up 08/21/2019

## 2019-08-16 MED ORDER — ROSUVASTATIN CALCIUM 20 MG PO TABS: 20.0000 mg | ORAL_TABLET | Freq: Every day | ORAL | 0 refills | Status: DC

## 2019-08-21 ENCOUNTER — Ambulatory Visit: Payer: Medicare Other | Admitting: Family Medicine

## 2019-09-09 DIAGNOSIS — G4733 Obstructive sleep apnea (adult) (pediatric): Secondary | ICD-10-CM | POA: Diagnosis not present

## 2019-09-12 ENCOUNTER — Ambulatory Visit (INDEPENDENT_AMBULATORY_CARE_PROVIDER_SITE_OTHER): Payer: Medicare Other | Admitting: Plastic Surgery

## 2019-09-12 ENCOUNTER — Other Ambulatory Visit: Payer: Self-pay

## 2019-09-12 ENCOUNTER — Encounter: Payer: Self-pay | Admitting: Plastic Surgery

## 2019-09-12 VITALS — BP 144/84 | HR 75 | Temp 98.2°F | Ht 64.0 in | Wt 171.0 lb

## 2019-09-12 DIAGNOSIS — M793 Panniculitis, unspecified: Secondary | ICD-10-CM | POA: Diagnosis not present

## 2019-09-12 NOTE — Progress Notes (Signed)
Referring Provider Steele Sizer, MD [address] [place],  [place] [address]   CC:  Chief Complaint  Patient presents with  . Advice Only    surgical options for decreasing skin      Janice Wilson is an 64 y.o. female.  HPI: Patient is referred by her dermatologist for rashes beneath her abdominal pannus.  He has been present for a number of years and has been refractory to topical creams, powders, and oral antibiotics.  Patient is interested in a panniculectomy.  She has been at a stable weight for some time.  Her only abdominal surgeries are hysterectomy and C-sections.  She is a current smoker.  Allergies  Allergen Reactions  . Latex Rash    Irritation  (Condoms and gloves)    Outpatient Encounter Medications as of 09/12/2019  Medication Sig Note  . acetaminophen (TYLENOL) 325 MG tablet Take 325 mg by mouth every 6 (six) hours as needed. For pain  11/04/2017: 2 tabs twice a day  . albuterol (VENTOLIN HFA) 108 (90 Base) MCG/ACT inhaler INHALE 1 PUFF INTO THE LUNGS EVERY 4 HOURS AS NEEDED FOR SHORTNESS OF BREATH   . cetirizine (ZYRTEC) 10 MG tablet Take 1 tablet (10 mg total) by mouth daily.   Marland Kitchen conjugated estrogens (PREMARIN) vaginal cream Place vaginally at bedtime.   . divalproex (DEPAKOTE ER) 500 MG 24 hr tablet Take 2 tablets (1,000 mg total) by mouth at bedtime.   . fluticasone (FLONASE) 50 MCG/ACT nasal spray PLACE 2 SPRAYS INTO BOTH NOSTRILS DAILY. FOR CONGESTION   . Fluticasone-Salmeterol (ADVAIR) 250-50 MCG/DOSE AEPB Inhale 1 puff into the lungs every 12 (twelve) hours as needed. FOR WHEEZING   . hydrOXYzine (ATARAX/VISTARIL) 25 MG tablet Take 1 tablet (25 mg total) by mouth 3 (three) times daily as needed for anxiety.   Marland Kitchen latanoprost (XALATAN) 0.005 % ophthalmic solution Apply to eye.   Marland Kitchen lisinopril (ZESTRIL) 10 MG tablet TAKE 1 TO 2 TABLETS BY MOUTH DAILY   . meloxicam (MOBIC) 15 MG tablet TAKE 1 TABLET BY MOUTH DAILY   . methylphenidate (METADATE ER)  20 MG ER tablet Take by mouth.   . montelukast (SINGULAIR) 10 MG tablet Take 1 tablet (10 mg total) by mouth at bedtime.   . Multiple Vitamins-Minerals (MULTIPLE VITAMINS/WOMENS PO) Take 1 tablet by mouth daily.   . rosuvastatin (CRESTOR) 20 MG tablet Take 1 tablet (20 mg total) by mouth daily.   . sertraline (ZOLOFT) 100 MG tablet TAKE 1 TABLET BY MOUTH EVERY DAY   . timolol (BETIMOL) 0.5 % ophthalmic solution Place 1 drop into both eyes every morning.     Marland Kitchen tiZANidine (ZANAFLEX) 4 MG tablet Take 1 tablet (4 mg total) by mouth every 8 (eight) hours as needed.   . triamcinolone cream (KENALOG) 0.1 % Apply 1 application topically 2 (two) times daily.   . clindamycin (CLEOCIN T) 1 % lotion    . methylphenidate (METADATE ER) 20 MG ER tablet Take by mouth.   . methylphenidate (METADATE ER) 20 MG ER tablet Take by mouth.    No facility-administered encounter medications on file as of 09/12/2019.     Past Medical History:  Diagnosis Date  . Anxiety   . Asthma   . Back pain   . Dental crown present    dental implants - top, front  . Depression   . GERD (gastroesophageal reflux disease)   . Glaucoma   . Headache(784.0)   . Heart murmur  followed by PCP  . Hypertension   . Neck pain    s/p fusion.  limited side-to-side motion.  No limits up and down motion.  . Neck pain   . Panic attack   . Shortness of breath   . Sleep apnea    uses CPAP machine sleep study 9/ 2012 done at sleep med in [place]  . Vertigo    no episodes in over 10 yrs  . Wears dentures    partial upper  . Wears hearing aid    left ear    Past Surgical History:  Procedure Laterality Date  . ABDOMINAL HYSTERECTOMY    . ANTERIOR CERVICAL DECOMP/DISCECTOMY FUSION  07/05/2011   Procedure: ANTERIOR CERVICAL DECOMPRESSION/DISCECTOMY FUSION 3 LEVELS;  Surgeon: Cooper Render Pool;  Location: [place] [facility];  Service: Neurosurgery;  Laterality: N/A;  Cervical Four-Five, Cervical Five-Six, Cervical Six-Seven Anterior Cervical  Decompression Fusion WITH ALLOGRAFT AND PLATING  . CESAREAN SECTION    . COLONOSCOPY WITH PROPOFOL N/A 04/16/2016   Procedure: COLONOSCOPY WITH PROPOFOL;  Surgeon: Lucilla Lame, MD;  Location: [place];  Service: Endoscopy;  Laterality: N/A;  sleep apnea LATEX sensitivity  . ESOPHAGOGASTRODUODENOSCOPY (EGD) WITH PROPOFOL N/A 01/12/2017   Procedure: ESOPHAGOGASTRODUODENOSCOPY (EGD) WITH PROPOFOL;  Surgeon: Jonathon Bellows, MD;  Location: [facility];  Service: Endoscopy;  Laterality: N/A;  Latex sensitivity sleep apnea  . PARTIAL HYSTERECTOMY      Family History  Problem Relation Age of Onset  . Cancer Mother        colon  . Stroke Father   . Drug abuse Sister   . Alcohol abuse Sister   . Anesthesia problems Neg Hx   . Breast cancer Neg Hx     Social History   Social History Narrative   Moved to [place]  Current tobacco use  Review of Systems General: Denies fevers, chills, weight loss CV: Denies chest pain, shortness of breath, palpitations  Physical Exam Vitals with BMI 09/12/2019 03/22/2019 03/06/2019  Height 5\' 4"  5\' 4"  -  Weight 171 lbs 165 lbs -  BMI A999333 A999333 -  Systolic 123456 - 0000000  Diastolic 84 - A999333  Pulse 75 - -  Some encounter information is confidential and restricted. Go to Review Flowsheets activity to see all data.    General:  No acute distress,  Alert and oriented, Non-Toxic, Normal speech and affect Abdomen: I do not palpate any hernias.  She has an overhanging abdominal pannus infraumbilical.  There is a number of sores and signs of scary skin irritation beneath the fold.  She has a transverse scar from her C-sections.  She does have intra-abdominal fat as well.  There is some upper abdomen fullness that she points out that is superficial to the abdominal wall as well.  Assessment/Plan Patient presents with a symptomatic abdominal pannus.  We discussed infraumbilical panniculectomy.  She is a reasonable candidate for this.  Her main  external risk factor is tobacco use.  I explained that this increases her chances of having wound healing complication.  She is planning to stop now and I would like her to have a been stopped for 4 to 6 weeks prior to surgery.  Regarding the fullness in her upper abdomen that bothers her we discussed liposuction.  I think it would be reasonable to do this as an add-on to her panniculectomy.  I explained really any aspect of the procedure that superior to the umbilicus would be considered cosmetic and her.  She is  interested in a quote for the additional liposuction.  We discussed the risks of the procedure that include bleeding, infection, damage surrounding structures, need for additional procedures.  She is fully understanding and wants to proceed.  Janice Wilson 09/12/2019, 11:53 AM

## 2019-09-20 ENCOUNTER — Telehealth: Payer: Self-pay | Admitting: Plastic Surgery

## 2019-09-20 ENCOUNTER — Telehealth: Payer: Self-pay

## 2019-09-20 ENCOUNTER — Encounter: Payer: Self-pay | Admitting: Plastic Surgery

## 2019-09-20 ENCOUNTER — Ambulatory Visit (INDEPENDENT_AMBULATORY_CARE_PROVIDER_SITE_OTHER): Payer: Medicare Other | Admitting: Plastic Surgery

## 2019-09-20 ENCOUNTER — Other Ambulatory Visit: Payer: Self-pay

## 2019-09-20 VITALS — BP 157/82 | HR 70 | Temp 97.7°F | Ht 64.0 in | Wt 171.0 lb

## 2019-09-20 DIAGNOSIS — M793 Panniculitis, unspecified: Secondary | ICD-10-CM

## 2019-09-20 NOTE — Telephone Encounter (Signed)
Patient called back to say she had left her surgery packet in the lobby bathroom and requested that I email the information to her so she has it. I scanned info and sent to email she confirmed.

## 2019-09-20 NOTE — Progress Notes (Signed)
Patient is here for preoperative visit for her upcoming panniculectomy.  We had discussed doing liposuction as an adjunct and she wanted to see if it could be approved through insurance.  I told her and her insurance company of both weight and that that medical necessity is not there and that they would not cover liposuction of the upper abdomen.  She is agreed to move forward with the infraumbilical panniculectomy for her symptoms of rashes and infections.  We discussed the risk of this procedure that include bleeding, infection, demonstrating structures, need for additional procedures.  We discussed the potential for wound healing complications and seromas requiring prolonged management.  We discussed the likelihood and potential for persistent contour irregularities and that this procedure would not address the upper abdomen.  We discussed the potential risks of anesthesia including DVT and cardiac complications.  We discussed the fact that she quit smoking 10 days ago and its risk implications for her procedure.  She is fully aware of all these risk.  We went over again the details of the operative and postoperative course.  I explained that she would have drains afterwards and that would need to come out usually within 1 to 2 weeks afterwards.  I explained the need to wear compressive garment afterwards.  She went through the in office consent which discusses all of the risks that I mentioned above and signed it.  We will plan to move forward with her surgery in the next couple weeks.

## 2019-09-20 NOTE — Telephone Encounter (Signed)
Called patient and advised that transferring  the fat from the panniculectomy and place in her buttock would be a out of pocket expense for her.   Patient agreed and understood.

## 2019-09-20 NOTE — H&P (View-Only) (Signed)
Patient is here for preoperative visit for her upcoming panniculectomy.  We had discussed doing liposuction as an adjunct and she wanted to see if it could be approved through insurance.  I told her and her insurance company of both weight and that that medical necessity is not there and that they would not cover liposuction of the upper abdomen.  She is agreed to move forward with the infraumbilical panniculectomy for her symptoms of rashes and infections.  We discussed the risk of this procedure that include bleeding, infection, demonstrating structures, need for additional procedures.  We discussed the potential for wound healing complications and seromas requiring prolonged management.  We discussed the likelihood and potential for persistent contour irregularities and that this procedure would not address the upper abdomen.  We discussed the potential risks of anesthesia including DVT and cardiac complications.  We discussed the fact that she quit smoking 10 days ago and its risk implications for her procedure.  She is fully aware of all these risk.  We went over again the details of the operative and postoperative course.  I explained that she would have drains afterwards and that would need to come out usually within 1 to 2 weeks afterwards.  I explained the need to wear compressive garment afterwards.  She went through the in office consent which discusses all of the risks that I mentioned above and signed it.  We will plan to move forward with her surgery in the next couple weeks.

## 2019-09-24 ENCOUNTER — Telehealth: Payer: Self-pay | Admitting: Plastic Surgery

## 2019-09-24 DIAGNOSIS — G4733 Obstructive sleep apnea (adult) (pediatric): Secondary | ICD-10-CM | POA: Diagnosis not present

## 2019-09-25 ENCOUNTER — Encounter (HOSPITAL_BASED_OUTPATIENT_CLINIC_OR_DEPARTMENT_OTHER): Payer: Self-pay | Admitting: Plastic Surgery

## 2019-09-25 ENCOUNTER — Other Ambulatory Visit: Payer: Self-pay

## 2019-09-25 NOTE — Telephone Encounter (Signed)
I called Janice Wilson, per Janice Wilson instruction, to explain the surgical procedure and the expectation of liposuction from an insurance standpoint. I advised the patient that I had called [employer] again to verify the coverage expectation for additional liposuction that is being requested by the patient. The CPT codes available for review are 15847 and [phone]. Both of these codes are identified by [facility] policy guidelines as "not considered reconstructive, and are not covered services." Janice Wilson from [facility] (call ref [phone]) confirmed that the codes are considered cosmetic and therefore not Wilson covered benefit.   I explained to the patient that Janice Wilson office notes indicate that she was informed of the panniculectomy procedure and associated outcome, and that any additional liposuction would be considered cosmetic and would incur an additional fee, separate from the insurance portion of the case. Janice Wilson understanding of the surgery and her conversation with the insurance were not consistent with the information presented, and she was advised by [facility] that she could insist that we resubmit for the liposuction and have it approved, as long as Janice Wilson labeled it as medically necessary. I advised Janice Wilson that Janice Wilson and I had discussed her case thoroughly several times, and that the upper abdomen is not considered medically necessary, as there is no indication of Wilson rash or impairment that would justify medical necessity for the upper abdomen submission. Janice Wilson has advised that the upper abdomen liposuction would be done as Wilson cosmetic procedure, and not part of the infraumbilical panniculectomy.   At this point, Janice Wilson expressed that she did not see how Janice Wilson could say it was not medically necessary because she has rashes and the pictures prove that. I asked her to explain to me where the rashes were so that I could visualize the areas of concern based on the photos in her chart. She indicated that the left and right  side of her belly have rashes. I explained to her that the upper abdomen that we were discussing with her was everything above the panniculectomy starting point, and that the upper abdomen includes the belly button up to the area below her breasts. She stated that this was not what she was trying to tell us she wanted, that she wanted to make sure the lateral portion of the abdomen was going to be addressed where the rashes are, and not just Wilson straight line down - causing skin to be left hanging over on both sides of the pannus. I verified with Janice Wilson, and he stated the right side of the pannus is potentially reasonable, but the left side area that she is indicating is higher than the panniculectomy incision would be. He can not justify the lateral portions of the abdomen for this procedure, especially the left, but assured he will perform the surgery to the fullest extent of what the guidelines allow.   I relayed this information to Janice Wilson, as well as offering to refer her to another plastic surgeon for Wilson second opinion, which she declined. She stated that she felt better about the expectations of the surgery and the intended outcome, and that if the lateral or upper abdomen need to be done at Wilson later time, she will address that then. I confirmed that all of her questions and expectations have been addressed from my perspective of the surgery process, and she agreed that they have been and she is comfortable with proceeding. I asked her if she needed Wilson member of the  clinical staff or Janice Wilson to call her to reinforce the surgery outcome and recovery expectations, and she stated that she did not, since she was comfortable with the explanation given. I encouraged her to call me if she has any further questions. She expressed understanding and agreed, and thanked me for taking the time to help her better understand and sort through her perception of the surgery.   Janice Wilson is aware and the surgery remains as  originally scheduled for April 13.

## 2019-09-25 NOTE — Telephone Encounter (Signed)
Thanks for your help.

## 2019-09-28 ENCOUNTER — Encounter (HOSPITAL_BASED_OUTPATIENT_CLINIC_OR_DEPARTMENT_OTHER)
Admission: RE | Admit: 2019-09-28 | Discharge: 2019-09-28 | Disposition: A | Discharge status: Home / Self Care | Payer: Medicare Other | Source: Ambulatory Visit | Attending: Plastic Surgery | Admitting: Plastic Surgery

## 2019-09-28 ENCOUNTER — Other Ambulatory Visit (HOSPITAL_COMMUNITY)
Admission: RE | Admit: 2019-09-28 | Discharge: 2019-09-28 | Disposition: A | Discharge status: Home / Self Care | Payer: Medicare Other | Source: Ambulatory Visit | Attending: Plastic Surgery | Admitting: Plastic Surgery

## 2019-09-28 DIAGNOSIS — Z01818 Encounter for other preprocedural examination: Secondary | ICD-10-CM | POA: Diagnosis not present

## 2019-09-28 DIAGNOSIS — Z20822 Contact with and (suspected) exposure to covid-19: Secondary | ICD-10-CM | POA: Diagnosis not present

## 2019-09-28 LAB — SARS CORONAVIRUS 2 (TAT 6-24 HRS): SARS Coronavirus 2: NEGATIVE

## 2019-09-28 NOTE — Progress Notes (Signed)
Pre op anesthesia consult with Dr Sabra Heck for h/o cervical injury and decreased ROM in neck.  OK to proceed as scheduled.

## 2019-09-28 NOTE — Progress Notes (Signed)
Surgical soap given with instructions, pt verbalized understanding.  

## 2019-10-02 ENCOUNTER — Encounter (HOSPITAL_BASED_OUTPATIENT_CLINIC_OR_DEPARTMENT_OTHER): Payer: Self-pay | Admitting: Plastic Surgery

## 2019-10-02 ENCOUNTER — Other Ambulatory Visit (INDEPENDENT_AMBULATORY_CARE_PROVIDER_SITE_OTHER): Payer: Medicare Other | Admitting: Plastic Surgery

## 2019-10-02 ENCOUNTER — Other Ambulatory Visit: Payer: Self-pay

## 2019-10-02 ENCOUNTER — Ambulatory Visit (HOSPITAL_BASED_OUTPATIENT_CLINIC_OR_DEPARTMENT_OTHER): Payer: Medicare Other | Admitting: Certified Registered"

## 2019-10-02 ENCOUNTER — Ambulatory Visit (HOSPITAL_BASED_OUTPATIENT_CLINIC_OR_DEPARTMENT_OTHER)
Admission: RE | Admit: 2019-10-02 | Discharge: 2019-10-02 | Disposition: A | Discharge status: Home / Self Care | Payer: Medicare Other | Attending: Plastic Surgery | Admitting: Plastic Surgery

## 2019-10-02 ENCOUNTER — Encounter (HOSPITAL_BASED_OUTPATIENT_CLINIC_OR_DEPARTMENT_OTHER)
Admission: RE | Disposition: A | Discharge status: Home / Self Care | Payer: Self-pay | Source: Home / Self Care | Attending: Plastic Surgery

## 2019-10-02 DIAGNOSIS — F329 Major depressive disorder, single episode, unspecified: Secondary | ICD-10-CM | POA: Diagnosis not present

## 2019-10-02 DIAGNOSIS — J45909 Unspecified asthma, uncomplicated: Secondary | ICD-10-CM | POA: Diagnosis not present

## 2019-10-02 DIAGNOSIS — F17200 Nicotine dependence, unspecified, uncomplicated: Secondary | ICD-10-CM | POA: Diagnosis not present

## 2019-10-02 DIAGNOSIS — G4730 Sleep apnea, unspecified: Secondary | ICD-10-CM | POA: Diagnosis not present

## 2019-10-02 DIAGNOSIS — Z79899 Other long term (current) drug therapy: Secondary | ICD-10-CM | POA: Insufficient documentation

## 2019-10-02 DIAGNOSIS — Z791 Long term (current) use of non-steroidal anti-inflammatories (NSAID): Secondary | ICD-10-CM | POA: Diagnosis not present

## 2019-10-02 DIAGNOSIS — L089 Local infection of the skin and subcutaneous tissue, unspecified: Secondary | ICD-10-CM | POA: Diagnosis not present

## 2019-10-02 DIAGNOSIS — M793 Panniculitis, unspecified: Secondary | ICD-10-CM | POA: Diagnosis not present

## 2019-10-02 DIAGNOSIS — Z974 Presence of external hearing-aid: Secondary | ICD-10-CM | POA: Insufficient documentation

## 2019-10-02 DIAGNOSIS — I10 Essential (primary) hypertension: Secondary | ICD-10-CM | POA: Insufficient documentation

## 2019-10-02 DIAGNOSIS — F419 Anxiety disorder, unspecified: Secondary | ICD-10-CM | POA: Insufficient documentation

## 2019-10-02 DIAGNOSIS — E785 Hyperlipidemia, unspecified: Secondary | ICD-10-CM | POA: Diagnosis not present

## 2019-10-02 HISTORY — PX: PANNICULECTOMY: SHX5360

## 2019-10-02 HISTORY — DX: Failed or difficult intubation, initial encounter: T88.4XXA

## 2019-10-02 SURGERY — PANNICULECTOMY
Anesthesia: General | Site: Abdomen

## 2019-10-02 MED ORDER — OXYCODONE HCL 5 MG PO TABS
5.0000 mg | ORAL_TABLET | Freq: Once | ORAL | Status: AC | PRN
  Administered 2019-10-02: 5 mg via ORAL

## 2019-10-02 MED ORDER — KETOROLAC TROMETHAMINE 30 MG/ML IJ SOLN
INTRAMUSCULAR | Status: AC
  Filled 2019-10-02: qty 1

## 2019-10-02 MED ORDER — CHLORHEXIDINE GLUCONATE CLOTH 2 % EX PADS: 6.0000 | MEDICATED_PAD | Freq: Once | CUTANEOUS | Status: DC

## 2019-10-02 MED ORDER — CEFAZOLIN SODIUM-DEXTROSE 2-4 GM/100ML-% IV SOLN
INTRAVENOUS | Status: AC
  Filled 2019-10-02: qty 100

## 2019-10-02 MED ORDER — DEXAMETHASONE SODIUM PHOSPHATE 10 MG/ML IJ SOLN
INTRAMUSCULAR | Status: DC | PRN
  Administered 2019-10-02: 10 mg via INTRAVENOUS

## 2019-10-02 MED ORDER — LACTATED RINGERS IV SOLN: INTRAVENOUS | Status: DC

## 2019-10-02 MED ORDER — DEXAMETHASONE SODIUM PHOSPHATE 10 MG/ML IJ SOLN
INTRAMUSCULAR | Status: AC
  Filled 2019-10-02: qty 1

## 2019-10-02 MED ORDER — HYDROMORPHONE HCL 1 MG/ML IJ SOLN
0.2500 mg | INTRAMUSCULAR | Status: DC | PRN
  Administered 2019-10-02: 0.25 mg via INTRAVENOUS
  Administered 2019-10-02 (×2): 0.5 mg via INTRAVENOUS
  Administered 2019-10-02: 0.25 mg via INTRAVENOUS
  Administered 2019-10-02: 0.5 mg via INTRAVENOUS

## 2019-10-02 MED ORDER — ACETAMINOPHEN 500 MG PO TABS
1000.0000 mg | ORAL_TABLET | Freq: Once | ORAL | Status: AC
  Administered 2019-10-02: 1000 mg via ORAL

## 2019-10-02 MED ORDER — FENTANYL CITRATE (PF) 100 MCG/2ML IJ SOLN
INTRAMUSCULAR | Status: DC | PRN
  Administered 2019-10-02: 100 ug via INTRAVENOUS
  Administered 2019-10-02 (×2): 25 ug via INTRAVENOUS
  Administered 2019-10-02 (×3): 50 ug via INTRAVENOUS

## 2019-10-02 MED ORDER — FENTANYL CITRATE (PF) 100 MCG/2ML IJ SOLN
INTRAMUSCULAR | Status: AC
  Filled 2019-10-02: qty 2

## 2019-10-02 MED ORDER — HYDROCODONE-ACETAMINOPHEN 5-325 MG PO TABS: 1.0000 | ORAL_TABLET | Freq: Three times a day (TID) | ORAL | 0 refills | Status: AC | PRN

## 2019-10-02 MED ORDER — HYDROMORPHONE HCL 1 MG/ML IJ SOLN
INTRAMUSCULAR | Status: AC
  Filled 2019-10-02: qty 0.5

## 2019-10-02 MED ORDER — ROCURONIUM BROMIDE 10 MG/ML (PF) SYRINGE
PREFILLED_SYRINGE | INTRAVENOUS | Status: AC
  Filled 2019-10-02: qty 10

## 2019-10-02 MED ORDER — ONDANSETRON HCL 4 MG/2ML IJ SOLN
INTRAMUSCULAR | Status: DC | PRN
  Administered 2019-10-02: 4 mg via INTRAVENOUS

## 2019-10-02 MED ORDER — KETOROLAC TROMETHAMINE 30 MG/ML IJ SOLN
30.0000 mg | Freq: Once | INTRAMUSCULAR | Status: AC
  Administered 2019-10-02: 15:00:00 30 mg via INTRAVENOUS

## 2019-10-02 MED ORDER — MIDAZOLAM HCL 2 MG/2ML IJ SOLN
INTRAMUSCULAR | Status: AC
  Filled 2019-10-02: qty 2

## 2019-10-02 MED ORDER — METOPROLOL TARTRATE 5 MG/5ML IV SOLN
INTRAVENOUS | Status: AC
  Filled 2019-10-02: qty 5

## 2019-10-02 MED ORDER — OXYCODONE HCL 5 MG PO TABS
ORAL_TABLET | ORAL | Status: AC
  Filled 2019-10-02: qty 1

## 2019-10-02 MED ORDER — OXYCODONE HCL 5 MG/5ML PO SOLN: 5.0000 mg | Freq: Once | ORAL | Status: AC | PRN

## 2019-10-02 MED ORDER — PROPOFOL 10 MG/ML IV BOLUS
INTRAVENOUS | Status: DC | PRN
  Administered 2019-10-02: 150 mg via INTRAVENOUS
  Administered 2019-10-02: 50 mg via INTRAVENOUS

## 2019-10-02 MED ORDER — FENTANYL CITRATE (PF) 100 MCG/2ML IJ SOLN
25.0000 ug | INTRAMUSCULAR | Status: DC | PRN
  Administered 2019-10-02: 50 ug via INTRAVENOUS
  Administered 2019-10-02 (×2): 25 ug via INTRAVENOUS

## 2019-10-02 MED ORDER — LACTATED RINGERS IV SOLN
INTRAVENOUS | Status: DC | PRN
  Administered 2019-10-02: 550 mL

## 2019-10-02 MED ORDER — CEFAZOLIN SODIUM-DEXTROSE 2-4 GM/100ML-% IV SOLN
2.0000 g | INTRAVENOUS | Status: AC
  Administered 2019-10-02: 2 g via INTRAVENOUS

## 2019-10-02 MED ORDER — ACETAMINOPHEN 500 MG PO TABS
ORAL_TABLET | ORAL | Status: AC
  Filled 2019-10-02: qty 2

## 2019-10-02 MED ORDER — ONDANSETRON HCL 4 MG/2ML IJ SOLN
INTRAMUSCULAR | Status: AC
  Filled 2019-10-02: qty 2

## 2019-10-02 MED ORDER — LIDOCAINE 2% (20 MG/ML) 5 ML SYRINGE
INTRAMUSCULAR | Status: DC | PRN
  Administered 2019-10-02: 100 mg via INTRAVENOUS

## 2019-10-02 MED ORDER — EPHEDRINE SULFATE 50 MG/ML IJ SOLN
INTRAMUSCULAR | Status: DC | PRN
  Administered 2019-10-02 (×3): 10 mg via INTRAVENOUS

## 2019-10-02 MED ORDER — ROCURONIUM BROMIDE 100 MG/10ML IV SOLN
INTRAVENOUS | Status: DC | PRN
  Administered 2019-10-02: 60 mg via INTRAVENOUS

## 2019-10-02 MED ORDER — SUGAMMADEX SODIUM 200 MG/2ML IV SOLN
INTRAVENOUS | Status: DC | PRN
  Administered 2019-10-02: 200 mg via INTRAVENOUS

## 2019-10-02 MED ORDER — EPHEDRINE 5 MG/ML INJ
INTRAVENOUS | Status: AC
  Filled 2019-10-02: qty 10

## 2019-10-02 MED ORDER — MIDAZOLAM HCL 5 MG/5ML IJ SOLN
INTRAMUSCULAR | Status: DC | PRN
  Administered 2019-10-02: 2 mg via INTRAVENOUS

## 2019-10-02 MED ORDER — PROMETHAZINE HCL 25 MG/ML IJ SOLN: 6.2500 mg | INTRAMUSCULAR | Status: DC | PRN

## 2019-10-02 SURGICAL SUPPLY — 75 items
APPLIER CLIP 9.375 MED OPEN (MISCELLANEOUS)
BAG DECANTER FOR FLEXI CONT (MISCELLANEOUS) ×2 IMPLANT
BENZOIN TINCTURE PRP APPL 2/3 (GAUZE/BANDAGES/DRESSINGS) ×4 IMPLANT
BINDER ABDOMINAL 12 SM 30-45 (SOFTGOODS) ×2 IMPLANT
BLADE CLIPPER SURG (BLADE) ×1 IMPLANT
BLADE SURG 10 STRL SS (BLADE) ×2 IMPLANT
BLADE SURG 11 STRL SS (BLADE) ×2 IMPLANT
BLADE SURG 15 STRL LF DISP TIS (BLADE) IMPLANT
BLADE SURG 15 STRL SS (BLADE)
CANISTER SUCT 1200ML W/VALVE (MISCELLANEOUS) ×2 IMPLANT
CHLORAPREP W/TINT 26 (MISCELLANEOUS) ×2 IMPLANT
CLIP APPLIE 9.375 MED OPEN (MISCELLANEOUS) ×1 IMPLANT
COVER BACK TABLE 60X90IN (DRAPES) ×2 IMPLANT
COVER MAYO STAND STRL (DRAPES) ×2 IMPLANT
COVER WAND RF STERILE (DRAPES) IMPLANT
DERMABOND ADVANCED (GAUZE/BANDAGES/DRESSINGS)
DERMABOND ADVANCED .7 DNX12 (GAUZE/BANDAGES/DRESSINGS) IMPLANT
DRAIN CHANNEL 15F RND FF W/TCR (WOUND CARE) ×4 IMPLANT
DRAIN CHANNEL 19F RND (DRAIN) IMPLANT
DRAPE TOP ARMCOVERS (MISCELLANEOUS) ×2 IMPLANT
DRAPE U-SHAPE 76X120 STRL (DRAPES) ×2 IMPLANT
DRAPE UTILITY XL STRL (DRAPES) ×2 IMPLANT
DRSG PAD ABDOMINAL 8X10 ST (GAUZE/BANDAGES/DRESSINGS) ×6 IMPLANT
ELECT BLADE 4.0 EZ CLEAN MEGAD (MISCELLANEOUS)
ELECT COATED BLADE 2.86 ST (ELECTRODE) IMPLANT
ELECT REM PT RETURN 9FT ADLT (ELECTROSURGICAL) ×2
ELECTRODE BLDE 4.0 EZ CLN MEGD (MISCELLANEOUS) IMPLANT
ELECTRODE REM PT RTRN 9FT ADLT (ELECTROSURGICAL) ×1 IMPLANT
EVACUATOR SILICONE 100CC (DRAIN) ×4 IMPLANT
GAUZE SPONGE 4X4 12PLY STRL (GAUZE/BANDAGES/DRESSINGS) ×1 IMPLANT
GAUZE XEROFORM 1X8 LF (GAUZE/BANDAGES/DRESSINGS) IMPLANT
GLOVE BIOGEL M STRL SZ7.5 (GLOVE) ×1 IMPLANT
GLOVE BIOGEL PI IND STRL 6.5 (GLOVE) IMPLANT
GLOVE BIOGEL PI IND STRL 8 (GLOVE) ×1 IMPLANT
GLOVE BIOGEL PI INDICATOR 6.5 (GLOVE) ×2
GLOVE BIOGEL PI INDICATOR 8 (GLOVE) ×1
GLOVE SURG SS PI 6.5 STRL IVOR (GLOVE) ×2 IMPLANT
GLOVE SURG SS PI 7.0 STRL IVOR (GLOVE) ×2 IMPLANT
GOWN STRL REUS W/ TWL LRG LVL3 (GOWN DISPOSABLE) ×2 IMPLANT
GOWN STRL REUS W/ TWL XL LVL3 (GOWN DISPOSABLE) IMPLANT
GOWN STRL REUS W/TWL LRG LVL3 (GOWN DISPOSABLE) ×10
GOWN STRL REUS W/TWL XL LVL3 (GOWN DISPOSABLE) ×2
NDL HYPO 25X1 1.5 SAFETY (NEEDLE) IMPLANT
NDL SPNL 18GX3.5 QUINCKE PK (NEEDLE) ×1 IMPLANT
NEEDLE HYPO 25X1 1.5 SAFETY (NEEDLE) IMPLANT
NEEDLE SPNL 18GX3.5 QUINCKE PK (NEEDLE) ×2 IMPLANT
NS IRRIG 1000ML POUR BTL (IV SOLUTION) ×2 IMPLANT
PACK BASIN DAY SURGERY FS (CUSTOM PROCEDURE TRAY) ×2 IMPLANT
PENCIL SMOKE EVACUATOR (MISCELLANEOUS) ×2 IMPLANT
PIN SAFETY STERILE (MISCELLANEOUS) ×2 IMPLANT
SHEET MEDIUM DRAPE 40X70 STRL (DRAPES) ×2 IMPLANT
SLEEVE SCD COMPRESS KNEE MED (MISCELLANEOUS) ×2 IMPLANT
SPONGE LAP 18X18 RF (DISPOSABLE) ×5 IMPLANT
STAPLER INSORB 30 2030 C-SECTI (MISCELLANEOUS) ×2 IMPLANT
STAPLER VISISTAT 35W (STAPLE) ×2 IMPLANT
STRIP SUTURE WOUND CLOSURE 1/2 (MISCELLANEOUS) ×2 IMPLANT
SUT ETHILON 2 0 FS 18 (SUTURE) ×4 IMPLANT
SUT MNCRL AB 4-0 PS2 18 (SUTURE) ×4 IMPLANT
SUT PDS AB 0 CT 36 (SUTURE) ×4 IMPLANT
SUT PDS AB 2-0 CT2 27 (SUTURE) IMPLANT
SUT PLAIN 5 0 P 3 18 (SUTURE) IMPLANT
SUT VIC AB 2-0 CT1 27 (SUTURE) ×8
SUT VIC AB 2-0 CT1 TAPERPNT 27 (SUTURE) ×1 IMPLANT
SUT VLOC 180 0 24IN GS25 (SUTURE) IMPLANT
SUT VLOC 90 P-14 23 (SUTURE) ×4 IMPLANT
SYR 50ML LL SCALE MARK (SYRINGE) ×2 IMPLANT
SYR BULB IRRIGATION 50ML (SYRINGE) ×2 IMPLANT
SYR CONTROL 10ML LL (SYRINGE) IMPLANT
TOWEL GREEN STERILE FF (TOWEL DISPOSABLE) ×4 IMPLANT
TRAY FOL W/BAG SLVR 16FR STRL (SET/KITS/TRAYS/PACK) IMPLANT
TRAY FOLEY W/BAG SLVR 14FR LF (SET/KITS/TRAYS/PACK) IMPLANT
TRAY FOLEY W/BAG SLVR 16FR LF (SET/KITS/TRAYS/PACK)
TUBE CONNECTING 20X1/4 (TUBING) ×2 IMPLANT
UNDERPAD 30X36 HEAVY ABSORB (UNDERPADS AND DIAPERS) ×4 IMPLANT
YANKAUER SUCT BULB TIP NO VENT (SUCTIONS) ×2 IMPLANT

## 2019-10-02 NOTE — Interval H&P Note (Signed)
History and Physical Interval Note:  10/02/2019 10:44 AM  Janice Wilson  has presented today for surgery, with the diagnosis of panniculitis.  The various methods of treatment have been discussed with the patient and family. After consideration of risks, benefits and other options for treatment, the patient has consented to  Procedure(s) with comments: PANNICULECTOMY (N/A) - 2 hours as a surgical intervention.  The patient's history has been reviewed, patient examined, no change in status, stable for surgery.  I have reviewed the patient's chart and labs.  Questions were answered to the patient's satisfaction.     Cindra Presume

## 2019-10-02 NOTE — Anesthesia Postprocedure Evaluation (Signed)
Anesthesia Post Note  Patient: Janice Wilson  Procedure(s) Performed: PANNICULECTOMY (N/A Abdomen)     Patient location during evaluation: PACU Anesthesia Type: General Level of consciousness: awake and alert and oriented Pain management: pain level controlled Vital Signs Assessment: post-procedure vital signs reviewed and stable Respiratory status: spontaneous breathing, nonlabored ventilation and respiratory function stable Cardiovascular status: blood pressure returned to baseline Postop Assessment: no apparent nausea or vomiting Anesthetic complications: no    Last Vitals:  Vitals:   10/02/19 1445 10/02/19 1500  BP: (!) 152/91 (!) 162/85  Pulse: 67 72  Resp: 20 13  Temp:    SpO2: 93% 94%    Last Pain:  Vitals:   10/02/19 1451  TempSrc:   PainSc: Naomi E Ronica Vivian

## 2019-10-02 NOTE — Progress Notes (Signed)
Post-op pain Rx sent to pharmacy

## 2019-10-02 NOTE — Op Note (Signed)
Operative Note   DATE OF OPERATION: 10/02/2019  SURGICAL DEPARTMENT: Plastic Surgery  PREOPERATIVE DIAGNOSES: Panniculitis  POSTOPERATIVE DIAGNOSES:  same  PROCEDURE: Infraumbilical panniculectomy  SURGEON: Talmadge Coventry, MD  ASSISTANT: Phoebe Sharps, PA The advanced practice practitioner (APP) assisted throughout the case.  The APP was essential in retraction and counter traction when needed to make the case progress smoothly.  This retraction and assistance made it possible to see the tissue plans for the procedure.  The assistance was needed for blood control, tissue re-approximation and assisted with closure of the incision site.  ANESTHESIA:  General.   COMPLICATIONS: None.   INDICATIONS FOR PROCEDURE:  The patient, Janice Wilson is a 64 y.o. female born on May 18, 1956, is here for treatment of symptomatic abdominal pannus. MRN: [id number]  CONSENT:  Informed consent was obtained directly from the patient. Risks, benefits and alternatives were fully discussed. Specific risks including but not limited to bleeding, infection, hematoma, seroma, scarring, pain, contracture, asymmetry, wound healing problems, and need for further surgery were all discussed. The patient did have an ample opportunity to have questions answered to satisfaction.   DESCRIPTION OF PROCEDURE:  The patient was taken to the operating room. SCDs were placed and Ancef antibiotics were given.  General anesthesia was administered.  The patient's operative site was prepped and draped in a sterile fashion. A time out was performed and all information was confirmed to be correct.  I started by marking the midline from the vulvar commissure to the umbilicus.  I then planned out the inferior incision just below the infra pannus crease.  I then estimated the superior incision and the amount of excision.  The area was then infiltrated with tumescent solution.  I started by making the inferior incision with a 10 blade.   I dissected down to the fascia with cautery.  I then undermined superiorly to approximately the level of the umbilicus.  I used tailor tacking to estimate the accuracy of the superior incision.  Superior incision was then made with a 10 blade and the specimen was removed on the left and then the right side.  Hemostasis was meticulously obtained.  A 15 [place] JP drain was placed and secured with a nylon suture.  Closure was done with buried 2-0 Vicryl sutures for Scarpa's layer.  The skin was closed with a combination of in sorb staples and a running 3-0 V lock suture.  Steri-Strips and a soft dressing were then applied as well as an abdominal binder.  The patient tolerated the procedure well.  There were no complications. The patient was allowed to wake from anesthesia, extubated and taken to the recovery room in satisfactory condition.

## 2019-10-02 NOTE — Anesthesia Procedure Notes (Signed)
Procedure Name: Intubation Date/Time: 10/02/2019 11:37 AM Performed by: Brennan Bailey, MD Pre-anesthesia Checklist: Patient identified, Emergency Drugs available, Suction available and Patient being monitored Patient Re-evaluated:Patient Re-evaluated prior to induction Oxygen Delivery Method: Circle System Utilized Preoxygenation: Pre-oxygenation with 100% oxygen Induction Type: IV induction Ventilation: Mask ventilation without difficulty Laryngoscope Size: Glidescope and 3 Grade View: Grade III Tube type: Oral Tube size: 7.0 mm Number of attempts: 3 Airway Equipment and Method: Rigid stylet and Video-laryngoscopy Placement Confirmation: ETT inserted through vocal cords under direct vision,  positive ETCO2 and breath sounds checked- equal and bilateral Secured at: 22 cm Tube secured with: Tape Dental Injury: Teeth and Oropharynx as per pre-operative assessment  Difficulty Due To: Difficult Airway- due to anterior larynx and Difficult Airway- due to reduced neck mobility Future Recommendations: Recommend- induction with short-acting agent, and alternative techniques readily available Comments: First attempt by CRNA with Mac3 blade, unable to view glottis. Second attempt by myself with Miller2 with grade 3 view, anterior airway noted with somewhat decreased neck extension. Intubated atraumatically with Glidescope #3, incomplete view (2B) of cords with Glidescope. Easy mask ventilation between attempts with SpO2>95% throughout attempts. Recommend video laryngoscopy for future intubations. Daiva Huge, MD

## 2019-10-02 NOTE — Transfer of Care (Signed)
Immediate Anesthesia Transfer of Care Note  Patient: SUCCESS DELLES  Procedure(s) Performed: PANNICULECTOMY (N/A Abdomen)  Patient Location: PACU  Anesthesia Type:General  Level of Consciousness: awake and patient cooperative  Airway & Oxygen Therapy: Patient Spontanous Breathing and Patient connected to face mask oxygen  Post-op Assessment: Report given to RN and Post -op Vital signs reviewed and stable  Post vital signs: Reviewed and stable  Last Vitals:  Vitals Value Taken Time  BP    Temp    Pulse 82 10/02/19 1329  Resp    SpO2 99 % 10/02/19 1329  Vitals shown include unvalidated device data.  Last Pain:  Vitals:   10/02/19 1001  TempSrc: Oral  PainSc: 0-No pain         Complications: No apparent anesthesia complications

## 2019-10-02 NOTE — Brief Op Note (Signed)
10/02/2019  1:10 PM  PATIENT:  Janice Wilson  64 y.o. female  PRE-OPERATIVE DIAGNOSIS:  panniculitis  POST-OPERATIVE DIAGNOSIS:  panniculitis  PROCEDURE:  Procedure(s) with comments: PANNICULECTOMY (N/A) - 2 hours  SURGEON:  Surgeon(s) and Role:    * Edia Pursifull, Steffanie Dunn, MD - Primary  PHYSICIAN ASSISTANT: Phoebe Sharps, PA  ASSISTANTS: none   ANESTHESIA:   general  EBL:  100 mL   BLOOD ADMINISTERED:none  DRAINS: JP drain  LOCAL MEDICATIONS USED:  MARCAINE     SPECIMEN:  Source of Specimen:  pannus  DISPOSITION OF SPECIMEN:  PATHOLOGY  COUNTS:  YES  TOURNIQUET:  * No tourniquets in log *  DICTATION: .Dragon Dictation  PLAN OF CARE: Discharge to home after PACU  PATIENT DISPOSITION:  PACU - hemodynamically stable.   Delay start of Pharmacological VTE agent (>24hrs) due to surgical blood loss or risk of bleeding: not applicable

## 2019-10-02 NOTE — Anesthesia Preprocedure Evaluation (Addendum)
Anesthesia Evaluation  Patient identified by MRN, date of birth, ID band Patient awake    Reviewed: Allergy & Precautions, NPO status , Patient's Chart, lab work & pertinent test results  History of Anesthesia Complications Negative for: history of anesthetic complications  Airway Mallampati: II  TM Distance: >3 FB Neck ROM: Full    Dental  (+) Missing,    Pulmonary asthma , sleep apnea and Continuous Positive Airway Pressure Ventilation , COPD, Current Smoker and Patient abstained from smoking.,    Pulmonary exam normal        Cardiovascular hypertension, Pt. on medications Normal cardiovascular exam     Neuro/Psych Anxiety Depression S/p cervical fusion    GI/Hepatic Neg liver ROS, GERD  Controlled,  Endo/Other  negative endocrine ROS  Renal/GU negative Renal ROS  negative genitourinary   Musculoskeletal negative musculoskeletal ROS (+)   Abdominal   Peds  Hematology negative hematology ROS (+)   Anesthesia Other Findings Narrow angle laucoma  Reproductive/Obstetrics negative OB ROS                           Anesthesia Physical Anesthesia Plan  ASA: II  Anesthesia Plan: General   Post-op Pain Management:    Induction: Intravenous  PONV Risk Score and Plan: 3 and Treatment may vary due to age or medical condition, Ondansetron, Dexamethasone and Midazolam  Airway Management Planned: Oral ETT  Additional Equipment: None  Intra-op Plan:   Post-operative Plan: Extubation in OR  Informed Consent: I have reviewed the patients History and Physical, chart, labs and discussed the procedure including the risks, benefits and alternatives for the proposed anesthesia with the patient or authorized representative who has indicated his/her understanding and acceptance.     Dental advisory given  Plan Discussed with: CRNA  Anesthesia Plan Comments:       Anesthesia Quick  Evaluation

## 2019-10-02 NOTE — Discharge Instructions (Signed)
No Tylenol until 4:30pm if needed.  Avoid strenuous activity.  Recommend walking at least a few steps every hour while awake to reduce the chance of blood clots.  Diet: Regular  Wound Care: Keep dressing clean & dry.  You can shower in 2 days.  Wear abdominal binder as much as possible 24/7.  Special Instructions: Continue to empty, recharge, & record drainage from drains 2-3 times a day, or as needed.  Call Dr. Keane Scrape office if any unusual problems occur such as severe pain, excessive bleeding, unrelieved nausea/vomiting, fever &/or chills.  Do not lay flat. Keep upper body elevated and knees bent when lying down.  Keep head of bed greater than 30 degrees for 2 weeks  Follow-up appointment: Scheduled for 4/22.    Post Anesthesia Home Care Instructions  Activity: Get plenty of rest for the remainder of the day. A responsible individual must stay with you for 24 hours following the procedure.  For the next 24 hours, DO NOT: -Drive a car -Paediatric nurse -Drink alcoholic beverages -Take any medication unless instructed by your physician -Make any legal decisions or sign important papers.  Meals: Start with liquid foods such as gelatin or soup. Progress to regular foods as tolerated. Avoid greasy, spicy, heavy foods. If nausea and/or vomiting occur, drink only clear liquids until the nausea and/or vomiting subsides. Call your physician if vomiting continues.  Special Instructions/Symptoms: Your throat may feel dry or sore from the anesthesia or the breathing tube placed in your throat during surgery. If this causes discomfort, gargle with warm salt water. The discomfort should disappear within 24 hours.  If you had a scopolamine patch placed behind your ear for the management of post- operative nausea and/or vomiting:  1. The medication in the patch is effective for 72 hours, after which it should be removed.  Wrap patch in a tissue and discard in the trash. Wash hands  thoroughly with soap and water. 2. You may remove the patch earlier than 72 hours if you experience unpleasant side effects which may include dry mouth, dizziness or visual disturbances. 3. Avoid touching the patch. Wash your hands with soap and water after contact with the patch.     About my Jackson-Pratt Bulb Drain  What is a Jackson-Pratt bulb? A Jackson-Pratt is a soft, round device used to collect drainage. It is connected to a long, thin drainage catheter, which is held in place by one or two small stiches near your surgical incision site. When the bulb is squeezed, it forms a vacuum, forcing the drainage to empty into the bulb.  Emptying the Jackson-Pratt bulb- To empty the bulb: 1. Release the plug on the top of the bulb. 2. Pour the bulb's contents into a measuring container which your nurse will provide. 3. Record the time emptied and amount of drainage. Empty the drain(s) as often as your     doctor or nurse recommends.  Date                  Time                    Amount (Drain 1)                 Amount (Drain 2)  _____________________________________________________________________  _____________________________________________________________________  _____________________________________________________________________  _____________________________________________________________________  _____________________________________________________________________  _____________________________________________________________________  _____________________________________________________________________  _____________________________________________________________________  Squeezing the Jackson-Pratt Bulb- To squeeze the bulb: 1. Make sure the plug at the top of the bulb is open. 2.  Squeeze the bulb tightly in your fist. You will hear air squeezing from the bulb. 3. Replace the plug while the bulb is squeezed. 4. Use a safety pin to attach the bulb to your clothing.  This will keep the catheter from     pulling at the bulb insertion site.  When to call your doctor- Call your doctor if:  Drain site becomes red, swollen or hot.  You have a fever greater than 101 degrees F.  There is oozing at the drain site.  Drain falls out (apply a guaze bandage over the drain hole and secure it with tape).  Drainage increases daily not related to activity patterns. (You will usually have more drainage when you are active than when you are resting.)  Drainage has a bad odor.   Post Anesthesia Home Care Instructions  Activity: Get plenty of rest for the remainder of the day. A responsible individual must stay with you for 24 hours following the procedure.  For the next 24 hours, DO NOT: -Drive a car -Paediatric nurse -Drink alcoholic beverages -Take any medication unless instructed by your physician -Make any legal decisions or sign important papers.  Meals: Start with liquid foods such as gelatin or soup. Progress to regular foods as tolerated. Avoid greasy, spicy, heavy foods. If nausea and/or vomiting occur, drink only clear liquids until the nausea and/or vomiting subsides. Call your physician if vomiting continues.  Special Instructions/Symptoms: Your throat may feel dry or sore from the anesthesia or the breathing tube placed in your throat during surgery. If this causes discomfort, gargle with warm salt water. The discomfort should disappear within 24 hours.  If you had a scopolamine patch placed behind your ear for the management of post- operative nausea and/or vomiting:  1. The medication in the patch is effective for 72 hours, after which it should be removed.  Wrap patch in a tissue and discard in the trash. Wash hands thoroughly with soap and water. 2. You may remove the patch earlier than 72 hours if you experience unpleasant side effects which may include dry mouth, dizziness or visual disturbances. 3. Avoid touching the patch. Wash your  hands with soap and water after contact with the patch.          JP Drain [employer] this sheet to all of your post-operative appointments while you have your drains.  Please measure your drains by CC's or ML's.  Make sure you drain and measure your JP Drains 2 or 3 times per day.  At the end of each day, add up totals for the left side and add up totals for the right side.    ( 9 am )     ( 3 pm )        ( 9 pm )                Date L  R  L  R  L  R  Total L/R

## 2019-10-04 ENCOUNTER — Telehealth: Payer: Self-pay | Admitting: Plastic Surgery

## 2019-10-04 LAB — SURGICAL PATHOLOGY

## 2019-10-04 NOTE — Telephone Encounter (Signed)
Returned patients call. Advised it was ok for gauze to fall out and to replace with a clean one. It is normal to bleed some several days from surgery. She can take 600 mg ibuprofen and 500 mg  tylenol together the first time, then every 6 hours take ibuprofen, 8 hours tylenol. Take the Norco before bedtime. Keep binder on as well.

## 2019-10-04 NOTE — Telephone Encounter (Signed)
Patient called to advise that a piece of gauze fell out and wanted to know if this is normal. Also, she still has drainage and blood and wants to know if that is normal and how long it will be draining/bleeding? She also requested stronger pain medication. Please call her back to advise.

## 2019-10-05 ENCOUNTER — Encounter: Payer: Self-pay | Admitting: *Deleted

## 2019-10-05 ENCOUNTER — Telehealth: Payer: Self-pay | Admitting: Plastic Surgery

## 2019-10-05 ENCOUNTER — Telehealth: Payer: Self-pay

## 2019-10-05 NOTE — Telephone Encounter (Signed)
Spoke with patient.

## 2019-10-05 NOTE — Telephone Encounter (Signed)
Spoke with patient and answered all of her questions.

## 2019-10-05 NOTE — Telephone Encounter (Signed)
Pt lvm to follow up from call this morning as well as to ask about the burning sensation she has at the incision site. Please give her a call to advise.

## 2019-10-05 NOTE — Telephone Encounter (Signed)
Patient called to state she has been experiencing numbness in her legs as well as in her right hand and fingers since yesterday. She also has bruising going down both legs. She would like a call back to discuss further.

## 2019-10-11 ENCOUNTER — Other Ambulatory Visit: Payer: Self-pay

## 2019-10-11 ENCOUNTER — Telehealth: Payer: Self-pay

## 2019-10-11 ENCOUNTER — Ambulatory Visit (INDEPENDENT_AMBULATORY_CARE_PROVIDER_SITE_OTHER): Payer: Medicare Other | Admitting: Plastic Surgery

## 2019-10-11 ENCOUNTER — Encounter: Payer: Self-pay | Admitting: Plastic Surgery

## 2019-10-11 ENCOUNTER — Other Ambulatory Visit: Payer: Self-pay | Admitting: General Practice

## 2019-10-11 VITALS — BP 145/81 | HR 86 | Temp 97.3°F | Ht 64.0 in | Wt 171.0 lb

## 2019-10-11 DIAGNOSIS — L270 Generalized skin eruption due to drugs and medicaments taken internally: Secondary | ICD-10-CM

## 2019-10-11 DIAGNOSIS — E7800 Pure hypercholesterolemia, unspecified: Secondary | ICD-10-CM

## 2019-10-11 DIAGNOSIS — J3089 Other allergic rhinitis: Secondary | ICD-10-CM

## 2019-10-11 DIAGNOSIS — M793 Panniculitis, unspecified: Secondary | ICD-10-CM

## 2019-10-11 DIAGNOSIS — L509 Urticaria, unspecified: Secondary | ICD-10-CM

## 2019-10-11 MED ORDER — ROSUVASTATIN CALCIUM 20 MG PO TABS: 20.0000 mg | ORAL_TABLET | Freq: Every day | ORAL | 0 refills | Status: DC

## 2019-10-11 MED ORDER — MONTELUKAST SODIUM 10 MG PO TABS: 10.0000 mg | ORAL_TABLET | Freq: Every day | ORAL | 0 refills | Status: DC

## 2019-10-11 MED ORDER — TRIAMCINOLONE ACETONIDE 0.1 % EX CREA: 1.0000 "application " | TOPICAL_CREAM | Freq: Two times a day (BID) | CUTANEOUS | 0 refills | Status: DC

## 2019-10-11 MED ORDER — CETIRIZINE HCL 10 MG PO TABS: 10.0000 mg | ORAL_TABLET | Freq: Every day | ORAL | 3 refills | Status: AC

## 2019-10-11 MED ORDER — GABAPENTIN 300 MG PO CAPS: 300.0000 mg | ORAL_CAPSULE | Freq: Three times a day (TID) | ORAL | 1 refills | Status: AC

## 2019-10-11 NOTE — Progress Notes (Signed)
Patient is here postop from panniculectomy.  She feels like she is doing well with a little bit of burning sensation along the left incision.  The drains are putting out about 100 cc/day.  On exam everything looks to be healing well.  There is a little bit of bruising in her abdomen and thighs.  There is not much swelling obvious along the wound edges.  The skin all appears healthy and looks to be healing well.  I have asked her to continue to wear the compressive garment as much as possible.  We will leave the drain in for another week and probably take it out next week.  I am going to give her a prescription for gabapentin to try to help with the burning pain.  All of her questions were answered.

## 2019-10-11 NOTE — Telephone Encounter (Signed)
RX REFILL cetirizine (ZYRTEC) 10 MG tablet [id number] rosuvastatin (CRESTOR) 20 MG tablet [id number] montelukast (SINGULAIR) 10 MG tablet [id number] triamcinolone cream (KENALOG) 0.1  [facility] [address], [place],  [address]

## 2019-10-11 NOTE — Telephone Encounter (Signed)
Called patient and advised the bruising on the thighs will take approximately 1 month to heal.

## 2019-10-12 ENCOUNTER — Telehealth: Payer: Self-pay | Admitting: Plastic Surgery

## 2019-10-12 NOTE — Telephone Encounter (Signed)
Patient called to say she is having severe pain where the binder/bandage was removed yesterday even though it has been put back on. She said she took tylenol yesterday and today and it hasn't helped with the pain. She said she has no more pain meds. Also, she wanted to know if she could get a binder that she steps into versus wraps around. Her insurance would pay for this if it comes from a medical supplier. Please call patient to advise. [facility] [place] [facility] [address]

## 2019-10-12 NOTE — Telephone Encounter (Signed)
Spoke with patient.  Dr. Claudia Desanctis sent Rx to her pharmacy yesterday for gabapentin for her pain. She may also alternate tylenol and ibuprofen for pain. In addition she may use ice for swelling and pain. Wrote RX for an abdominal compression garment that she can take to a medical supply store to see if they have something she likes. Per patient's request we are placing it in the mail as she lives in [place]. Advised patient that she can also purchase Shapewear compression garments at [employer], [employer], and other local stores if she likes.

## 2019-10-15 ENCOUNTER — Other Ambulatory Visit: Payer: Self-pay | Admitting: Family Medicine

## 2019-10-15 ENCOUNTER — Telehealth: Payer: Self-pay | Admitting: Plastic Surgery

## 2019-10-15 DIAGNOSIS — G8929 Other chronic pain: Secondary | ICD-10-CM

## 2019-10-15 DIAGNOSIS — J3089 Other allergic rhinitis: Secondary | ICD-10-CM

## 2019-10-15 DIAGNOSIS — L270 Generalized skin eruption due to drugs and medicaments taken internally: Secondary | ICD-10-CM

## 2019-10-15 DIAGNOSIS — E7800 Pure hypercholesterolemia, unspecified: Secondary | ICD-10-CM

## 2019-10-15 DIAGNOSIS — F4310 Post-traumatic stress disorder, unspecified: Secondary | ICD-10-CM

## 2019-10-15 DIAGNOSIS — F331 Major depressive disorder, recurrent, moderate: Secondary | ICD-10-CM

## 2019-10-15 DIAGNOSIS — M542 Cervicalgia: Secondary | ICD-10-CM

## 2019-10-15 MED ORDER — SERTRALINE HCL 100 MG PO TABS: 100.0000 mg | ORAL_TABLET | Freq: Every day | ORAL | 0 refills | Status: DC

## 2019-10-15 NOTE — Telephone Encounter (Signed)
Requested medication (s) are due for refill today: yes  Requested medication (s) are on the active medication list: yes  Last refill:  07/03/2019  Future visit scheduled: no  Notes to clinic:  last filled by a different provider  Review for refill   Requested Prescriptions  Pending Prescriptions Disp Refills   sertraline (ZOLOFT) 100 MG tablet 90 tablet 0    Sig: Take 1 tablet (100 mg total) by mouth daily.      Psychiatry:  Antidepressants - SSRI Failed - 10/15/2019  9:51 AM      Failed - Valid encounter within last 6 months    Recent Outpatient Visits           7 months ago MDD (major depressive disorder), recurrent episode, moderate ([place])   [facility] Steele Sizer, MD   10 months ago Essential hypertension   [facility] Steele Sizer, MD   11 months ago Essential hypertension   [facility] Steele Sizer, MD   1 year ago MDD (major depressive disorder), recurrent episode, moderate [facility])   [facility] [place], Drue Stager, MD   1 year ago Moderate episode of recurrent major depressive disorder [facility])   [facility] [place], Drue Stager, MD              Passed - Completed PHQ-2 or PHQ-9 in the last 360 days.

## 2019-10-15 NOTE — Telephone Encounter (Signed)
Called patient and got her voicemail which was full and was not accepting any messages at this time. I will try again later today to reach her

## 2019-10-15 NOTE — Telephone Encounter (Signed)
Called Patient and get her voicemail which was full and will not allow me to leave a message.  I will try again later today to reach her.

## 2019-10-15 NOTE — Telephone Encounter (Signed)
Medication Refill - Medication:  sertraline (ZOLOFT) 100 MG tablet   Has the patient contacted their pharmacy? Yes advised to call office. Pt stated she has been waiting 2 weeks for this and would like it asap.   Preferred Pharmacy (with phone number or street name):  [id number] [place] [place] - [address] [place] [employer], [place] [address]   Agent: Please be advised that RX refills may take up to 3 business days. We ask that you follow-up with your pharmacy.

## 2019-10-15 NOTE — Telephone Encounter (Signed)
Pt said that she needs this [facility] [place] [facility] [address]. Also pt has virtual on 10-18-2019 and made a 89mnth followup for in person per Dr. request.

## 2019-10-15 NOTE — Telephone Encounter (Signed)
Patient called in this morning, in tears, regarding pain level. She said she cannot rest and is in so much pain so she wanted a narcotic to help her rest and ease the pain. She said the drainage is causing her a great deal of pain and nothing is draining into it so she doesn't know why it is still there. Please call patient to advise. She requested that it be sent high priority so that she is not waiting until the very end of the day for a response.

## 2019-10-15 NOTE — Telephone Encounter (Signed)
Spoke with patient at 2:03 PM today. As soon as I introduced myself patient immediately became aggressive and hostile. She yelled that wanted 'stronger narcotics' and that she wanted 'something to knock her out'. I tried to explain that Dr. Claudia Desanctis had sent in gabapentin for the pain she was having last week and that typically made people feel sleepy, but she cut me off multiple times stating that she didn't want that, she wanted 'stronger narcotics'. I explained that she is 2 weeks Postop without complications so that I wouldn't be calling in stronger narcotics, but that I'd be happy to call in another prescription pain medication, Celebrex.Patient again replied she wanted stronger narcotics. She proceeded to yell about not having received a return call until after 2:00 in the afternoon.  I apologized and explained that I had called her two times earlier today, but that both times I got her voicemail and it was full and it would not allow me to leave a message. Patient yelled at me that I did not call her. ([employer] log shows I called at 10:23 am, 11:44 am, and 2:03 pm today). Patient continued yelling about several other things becoming verbally abusive. I apologized multiple times and tried to provide information to clarify and calm the patient. I asked the patient about her drain output and she replied the drain was fine and returned to asking for pain medication. I offered to call in Celebrex for her.  She replied to 'just forget it' and hung up on me.   At this time the patient is 2 weeks post-op without any complications. At her last visit on 4/22 patient was healing well and an Rx for gabapentin was sent in for the burning pain she was experiencing along the left incision. Discussed case with Dr. Claudia Desanctis. Patient was provided Norco at the time of surgery and we will not be calling in additional stronger narcotics for her.

## 2019-10-16 ENCOUNTER — Telehealth: Payer: Self-pay | Admitting: Plastic Surgery

## 2019-10-16 NOTE — Telephone Encounter (Signed)
I received the message that the patient requested a phone call.  I have also read her chart from her discussion with [facility] yesterday.  I have called 3 times today and have not been able to leave a message.  The voicemail is full.  I called from [employer].

## 2019-10-17 ENCOUNTER — Telehealth: Payer: Self-pay | Admitting: Plastic Surgery

## 2019-10-17 NOTE — Telephone Encounter (Signed)
I was able to get a hold of Riti Amon today by phone.  We had two separate conversations as I needed to interrupt the first 1 to go to the OR.  We spoke for about an hour in total.  I think we had a very amicable conversation and she was very forthcoming about her surgery process.  I think it was very enlightening to hear her experience.    I much appreciated her sharing with me.  I think that there were several areas of misunderstanding and can be improved upon.  For example one of the postop orders instructed the patient could shower in 2 days.  The patient states she was told by the nurse not to shower until the drains came out.  I explained to the patient that these things can vary and sometimes there is not an absolute right and wrong.   Overall she is doing much better.  She also started meditation which seems to be helping as well.  Her pain is controlled with the Motrin and the Tylenol.  The drain site is tender but the patient states she can tolerate it till tomorrow when she comes to the her postop visit.  Additionally the patient expressed that she had over an hour wait when she came for her postop visit.  This was disheartening to her as she was not given updates while waiting.  This is something that we certainly will continue to work on.  The patient has agreed to come and see [place] tomorrow.  We are hopeful that she will be able to get her drain out.  I have offered to see the patient in follow-up.  The patient inquired about compensation for her time.  I certainly appreciate her talking with me today.  I will let the staff know to be sure the patient does not have an excessive wait time tomorrow.  I remain available as needed.

## 2019-10-18 ENCOUNTER — Encounter: Payer: Self-pay | Admitting: Family Medicine

## 2019-10-18 ENCOUNTER — Other Ambulatory Visit: Payer: Self-pay

## 2019-10-18 ENCOUNTER — Encounter: Payer: Self-pay | Admitting: Plastic Surgery

## 2019-10-18 ENCOUNTER — Ambulatory Visit (INDEPENDENT_AMBULATORY_CARE_PROVIDER_SITE_OTHER): Payer: Medicare Other | Admitting: Family Medicine

## 2019-10-18 ENCOUNTER — Ambulatory Visit (INDEPENDENT_AMBULATORY_CARE_PROVIDER_SITE_OTHER): Payer: Medicare Other | Admitting: Plastic Surgery

## 2019-10-18 DIAGNOSIS — F331 Major depressive disorder, recurrent, moderate: Secondary | ICD-10-CM | POA: Diagnosis not present

## 2019-10-18 DIAGNOSIS — G4701 Insomnia due to medical condition: Secondary | ICD-10-CM | POA: Diagnosis not present

## 2019-10-18 DIAGNOSIS — Z9889 Other specified postprocedural states: Secondary | ICD-10-CM | POA: Insufficient documentation

## 2019-10-18 DIAGNOSIS — J4540 Moderate persistent asthma, uncomplicated: Secondary | ICD-10-CM

## 2019-10-18 DIAGNOSIS — I10 Essential (primary) hypertension: Secondary | ICD-10-CM | POA: Diagnosis not present

## 2019-10-18 DIAGNOSIS — F4310 Post-traumatic stress disorder, unspecified: Secondary | ICD-10-CM

## 2019-10-18 MED ORDER — HYDROXYZINE HCL 25 MG PO TABS: 25.0000 mg | ORAL_TABLET | Freq: Three times a day (TID) | ORAL | 0 refills | Status: AC | PRN

## 2019-10-18 MED ORDER — LISINOPRIL-HYDROCHLOROTHIAZIDE 20-12.5 MG PO TABS: 1.0000 | ORAL_TABLET | Freq: Every day | ORAL | 0 refills | Status: AC

## 2019-10-18 MED ORDER — SERTRALINE HCL 100 MG PO TABS: 100.0000 mg | ORAL_TABLET | Freq: Every day | ORAL | 0 refills | Status: DC

## 2019-10-18 MED ORDER — DIVALPROEX SODIUM ER 500 MG PO TB24: 1000.0000 mg | ORAL_TABLET | Freq: Every day | ORAL | 0 refills | Status: AC

## 2019-10-18 MED ORDER — FLUTICASONE-SALMETEROL 250-50 MCG/DOSE IN AEPB: 1.0000 | INHALATION_SPRAY | Freq: Two times a day (BID) | RESPIRATORY_TRACT | 0 refills | Status: DC | PRN

## 2019-10-18 NOTE — Progress Notes (Signed)
Patient is a 64 year old female here for follow-up after undergoing an infraumbilical panniculectomy on 10/02/2019 with Dr. Claudia Desanctis.  At her visit last week she was healing very well with minimal swelling and a small amount of bruising on her abdomen and thighs.  Drain was putting out approximately 100 cc/day.    She is 2 weeks PO.  Incisions are healing very well, C/D/I.  No signs of infection, redness, drainage.  All Steri-Strips were removed today.  Mild diffuse swelling is present patient is very pleased with the results and states that Dr. Claudia Desanctis did a very good job.  Patient reports the drain output is currently at 30 cc and has been between 30 and 50 cc for the last several days.  Drain was removed today.  Mild irritation redness around drain site.  No signs of infection, seroma or hematoma. Patient reports pain is well controlled with ibuprofen and Tylenol and is in agreement to continue with this plan of management.  Patient may apply Vaseline to the incision line daily as desired.  Cover drain site with Vaseline and gauze or Band-Aid until closed.  May use ABDs or other gauze as desired for comfort around incisions under compression garment.  Hand written prescription provided to patient to try to take to medical supply store to see if they have different types of compression garments available that would be covered by insurance.  If not she can purchase compression garments (shape wear, spanks) at places like [employer], [employer], etc.  Avoid heavy lifting, full extension of back, and abdominal exercises.  Walking is ok.  Pictures were obtained of the patient and placed in the chart with the patient's or guardian's permission.  Follow up in 4-6 weeks. Call office with any questions/concerns.  The [place] was signed into law in 2016 which includes the topic of electronic health records.  This provides immediate access to information in MyChart.  This includes consultation notes,  operative notes, office notes, lab results and pathology reports.  If you have any questions about what you read please let us know at your next visit or call us at the office.  We are right here with you.

## 2019-10-18 NOTE — Progress Notes (Signed)
Name: Janice Wilson   MRN: [id number]    DOB: May 28, 1956   Date:10/18/2019       Progress Note  Subjective  Chief Complaint  Chief Complaint  Patient presents with  . Medication Refill  . Depression  . Hypertension    Denies any symptoms  . Pre-diabetes  . COPD    I connected with  Naoma Diener on 10/18/19 at  9:40 AM EDT by telephone and verified that I am speaking with the correct person using two identifiers.  I discussed the limitations, risks, security and privacy concerns of performing an evaluation and management service by telephone and the availability of in person appointments. Staff also discussed with the patient that there may be a patient responsible charge related to this service. Patient Location: at home  Provider Location: [facility]   HPI   HTN: bp during recent visit to plastic surgeon was 145/81. She states bp has been elevated because she has not been taking medication consistently. She denies chest pain or palpitation . We will switch to lisinopril hctz today   Pre-diabetes:  she feels like her mouth is always dry, she has been drinking more water, she denies polyphagia but has polyuria. Reviewed last labs   MDD/PTSD: still seeing Dr. Shea Evans, her phq 9 was elevated today. She takes  Zoloft and Atarax. She states she ran out of Zoloft because she missed an appointment with Dr. Shea Evans and was very emotional, she states she is trying to take care of herself instead of taking care of everyone else. She states medication really helps her. Her last visit with Dr. Shea Evans was October 2021, she is not sure if she will go back to her, moved to [place].   COPD/Asthma: she is using symbicort  daily, still has a daily morning cough,sometimes wheezing because of pollen, she will see another provider tomorrow in [place]. She states she wants something quitting smoking but does not recall the name that Dr. Manuella Ghazi suggested  History of panniculectomy: she had surgery  done 10/02/2019 by Dr. Claudia Desanctis, during her follow up 04/22 they gave her gabapentin for burning sensation on the area   Atherosclerosis of aorta/centrilobular Emphysema: on CT scan chest 03/2019 . Continue crestor and aspirin daily   Patient Active Problem List   Diagnosis Date Noted  . Atherosclerosis of aorta ([place]) 03/22/2019  . Centrilobular emphysema ([place]) 03/22/2019  . PTSD (post-traumatic stress disorder) 01/29/2019  . Tobacco use disorder 01/29/2019  . MDD (major depressive disorder), recurrent episode, moderate ([place]) 11/23/2018  . Right renal stone 10/16/2018  . Renal cyst, left 10/16/2018  . Pre-diabetes 07/27/2018  . Cannabis use disorder, moderate, dependence ([place]) 07/26/2018  . TMJ (temporomandibular joint disorder) 02/07/2018  . Chronic angle-closure glaucoma of eye, left, mild stage 08/24/2017  . 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 Dry eye syndrome of both lacrimal glands 08/24/2017  . Age-related nuclear cataract of both eyes 08/24/2017  . Murmur, cardiac 12/16/2016  . Disorder of patellofemoral joint 12/16/2016  . Hidradenitis 12/16/2016  . Speech and language deficits 12/16/2016  . Vitamin D deficiency 12/16/2016  . Hypertension 10/11/2016  . Melanosis of colon   . Hyperlipidemia 09/15/2015  . Overweight (BMI 25.0-29.9) 06/17/2015  . Apnea, sleep 04/15/2015  . Asthma, mild intermittent 04/15/2015  . Cervical disc disease 04/15/2015  . Dysfunction of eustachian tube 04/15/2015  . Compulsive tobacco user syndrome 04/15/2015  . Anxiety and depression 01/02/2015  . Chronic cervical pain 01/02/2015  .  Insomnia due to medical condition 01/02/2015  . Female cystocele 07/31/2014  . Cervical spinal stenosis 07/05/2011    Past Surgical History:  Procedure Laterality Date  . ABDOMINAL HYSTERECTOMY    . ANTERIOR CERVICAL DECOMP/DISCECTOMY FUSION  07/05/2011   Procedure: ANTERIOR CERVICAL DECOMPRESSION/DISCECTOMY FUSION 3 LEVELS;  Surgeon: Cooper Render  Pool;  Location: [facility];  Service: Neurosurgery;  Laterality: N/A;  Cervical Four-Five, Cervical Five-Six, Cervical Six-Seven Anterior Cervical Decompression Fusion WITH ALLOGRAFT AND PLATING  . CESAREAN SECTION    . COLONOSCOPY WITH PROPOFOL N/A 04/16/2016   Procedure: COLONOSCOPY WITH PROPOFOL;  Surgeon: Lucilla Lame, MD;  Location: [place];  Service: Endoscopy;  Laterality: N/A;  sleep apnea LATEX sensitivity  . ESOPHAGOGASTRODUODENOSCOPY (EGD) WITH PROPOFOL N/A 01/12/2017   Procedure: ESOPHAGOGASTRODUODENOSCOPY (EGD) WITH PROPOFOL;  Surgeon: Jonathon Bellows, MD;  Location: [facility];  Service: Endoscopy;  Laterality: N/A;  Latex sensitivity sleep apnea  . PANNICULECTOMY N/A 10/02/2019   Procedure: PANNICULECTOMY;  Surgeon: Cindra Presume, MD;  Location: [facility];  Service: Plastics;  Laterality: N/A;  2 hours  . PARTIAL HYSTERECTOMY      Family History  Problem Relation Age of Onset  . Cancer Mother        colon  . Stroke Father   . Drug abuse Sister   . Alcohol abuse Sister   . Anesthesia problems Neg Hx   . Breast cancer Neg Hx     Social History   Socioeconomic History  . Marital status: Divorced    Spouse name: Not on file  . Number of children: 1  . Years of education: Not on file  . Highest education level: Associate degree: academic program  Occupational History  . Occupation: Disabled  Tobacco Use  . Smoking status: Current Every Day Smoker    Packs/day: 0.75    Years: 40.00    Pack years: 30.00    Types: Cigarettes    Start date: 03/06/1979  . Smokeless tobacco: Never Used  Substance and Sexual Activity  . Alcohol use: Yes    Alcohol/week: 0.0 standard drinks    Comment: 1-2 beer a month(Holidays)  . Drug use: Yes    Types: Marijuana    Comment: daily  . Sexual activity: Not Currently    Partners: Female, Female    Birth control/protection: Condom  Other Topics Concern  . Not on file  Social History Narrative    Moved to [place] [place] [employer] Strain: Medium Risk  . Difficulty of Paying Living Expenses: Somewhat hard  Food Insecurity: Food Insecurity Present  . Worried About Charity fundraiser in the Last Year: Sometimes true  . Ran Out of Food in the Last Year: Sometimes true  Transportation Needs: No Transportation Needs  . Lack of Transportation (Medical): No  . Lack of Transportation (Non-Medical): No  Physical Activity: Insufficiently Active  . Days of Exercise per Week: 3 days  . Minutes of Exercise per Session: 40 min  Stress:   . Feeling of Stress :   Social Connections: Unknown  . Frequency of Communication with Friends and Family: Three times a week  . Frequency of Social Gatherings with Friends and Family: Never  . Attends Religious Services: Never  . Active Member of Clubs or Organizations: Not on file  . Attends Archivist Meetings: Not on file  . Marital Status: Not on file  Intimate Partner Violence: Not At Risk  . Fear  of Current or Ex-Partner: No  . Emotionally Abused: No  . Physically Abused: No  . Sexually Abused: No     Current Outpatient Medications:  .  acetaminophen (TYLENOL) 325 MG tablet, Take 325 mg by mouth every 6 (six) hours as needed. For pain , Disp: , Rfl:  .  albuterol (VENTOLIN HFA) 108 (90 Base) MCG/ACT inhaler, INHALE 1 PUFF INTO THE LUNGS EVERY 4 HOURS AS NEEDED FOR SHORTNESS OF BREATH, Disp: 8.5 g, Rfl: 1 .  cetirizine (ZYRTEC) 10 MG tablet, Take 1 tablet (10 mg total) by mouth daily., Disp: 90 tablet, Rfl: 3 .  clindamycin (CLEOCIN T) 1 % lotion, , Disp: , Rfl:  .  clindamycin (CLINDAGEL) 1 % gel, Apply 1 application topically 2 (two) times daily., Disp: , Rfl:  .  conjugated estrogens (PREMARIN) vaginal cream, Place vaginally at bedtime., Disp: 42.5 g, Rfl: 0 .  divalproex (DEPAKOTE ER) 500 MG 24 hr tablet, Take 2 tablets (1,000 mg total) by mouth at bedtime., Disp: 180 tablet, Rfl: 0 .   fluticasone (FLONASE) 50 MCG/ACT nasal spray, PLACE 2 SPRAYS INTO BOTH NOSTRILS DAILY. FOR CONGESTION, Disp: 48 mL, Rfl: 1 .  Fluticasone-Salmeterol (ADVAIR) 250-50 MCG/DOSE AEPB, Inhale 1 puff into the lungs every 12 (twelve) hours as needed. FOR WHEEZING, Disp: 180 each, Rfl: 0 .  gabapentin (NEURONTIN) 300 MG capsule, Take 1 capsule (300 mg total) by mouth 3 (three) times daily., Disp: 90 capsule, Rfl: 1 .  hydrOXYzine (ATARAX/VISTARIL) 25 MG tablet, Take 1 tablet (25 mg total) by mouth 3 (three) times daily as needed for anxiety., Disp: 270 tablet, Rfl: 0 .  ibuprofen (ADVIL) 200 MG tablet, Take 200 mg by mouth daily., Disp: , Rfl:  .  latanoprost (XALATAN) 0.005 % ophthalmic solution, Apply to eye., Disp: , Rfl:  .  lisinopril (ZESTRIL) 10 MG tablet, TAKE 1 TO 2 TABLETS BY MOUTH DAILY, Disp: 180 tablet, Rfl: 1 .  loratadine (CLARITIN) 10 MG tablet, Take by mouth., Disp: , Rfl:  .  meloxicam (MOBIC) 15 MG tablet, TAKE 1 TABLET BY MOUTH DAILY, Disp: 90 tablet, Rfl: 0 .  methylphenidate (METADATE ER) 20 MG ER tablet, Take by mouth., Disp: , Rfl:  .  montelukast (SINGULAIR) 10 MG tablet, Take 1 tablet (10 mg total) by mouth at bedtime., Disp: 90 tablet, Rfl: 0 .  Multiple Vitamins-Minerals (MULTIPLE VITAMINS/WOMENS PO), Take 1 tablet by mouth daily., Disp: , Rfl:  .  rosuvastatin (CRESTOR) 20 MG tablet, Take 1 tablet (20 mg total) by mouth daily., Disp: 90 tablet, Rfl: 0 .  sertraline (ZOLOFT) 100 MG tablet, Take 1 tablet (100 mg total) by mouth daily., Disp: 3 tablet, Rfl: 0 .  timolol (BETIMOL) 0.5 % ophthalmic solution, Place 1 drop into both eyes every morning.  , Disp: , Rfl:  .  tiZANidine (ZANAFLEX) 4 MG tablet, Take 1 tablet (4 mg total) by mouth every 8 (eight) hours as needed., Disp: 90 tablet, Rfl: 0 .  triamcinolone cream (KENALOG) 0.1 %, Apply 1 application topically 2 (two) times daily., Disp: 30 g, Rfl: 0  Allergies  Allergen Reactions  . Latex Rash    Irritation  (Condoms and  gloves)    I personally reviewed active problem list, medication list, allergies, family history, social history, health maintenance with the patient/caregiver today.   ROS  Ten systems reviewed and is negative except as mentioned in HPI   Objective  Virtual encounter, vitals not obtained.  Body mass index is 28.67 kg/m.  Physical  Exam  Awake, alert and oriented  PHQ2/9: Depression screen [facility] 2/9 10/18/2019 03/06/2019 12/19/2018 11/23/2018 11/23/2018  Decreased Interest 0 0 0 1 0  Down, Depressed, Hopeless 1 0 0 0 0  PHQ - 2 Score 1 0 0 1 0  Altered sleeping 3 0 - 0 -  Tired, decreased energy 0 0 - 0 -  Change in appetite 0 0 - 0 -  Feeling bad or failure about yourself  0 0 - 0 -  Trouble concentrating 2 0 - 1 -  Moving slowly or fidgety/restless 1 0 - 0 -  Suicidal thoughts 0 0 - 0 -  PHQ-9 Score 7 0 - 2 -  Difficult doing work/chores Not difficult at all - - Not difficult at all -  Some recent data might be hidden   PHQ-2/9 Result is positive.    Fall Risk: Fall Risk  10/18/2019 03/06/2019 03/06/2019 12/19/2018 11/23/2018  Falls in the past year? 0 0 0 0 0  Number falls in past yr: 0 0 0 0 0  Injury with Fall? 0 0 0 0 0  Risk for fall due to : - - - - -  Risk for fall due to: Comment - - - - -     Assessment & Plan  1. PTSD (post-traumatic stress disorder)  - sertraline (ZOLOFT) 100 MG tablet; Take 1 tablet (100 mg total) by mouth daily.  Dispense: 90 tablet; Refill: 0 - hydrOXYzine (ATARAX/VISTARIL) 25 MG tablet; Take 1 tablet (25 mg total) by mouth 3 (three) times daily as needed for anxiety.  Dispense: 270 tablet; Refill: 0 - divalproex (DEPAKOTE ER) 500 MG 24 hr tablet; Take 2 tablets (1,000 mg total) by mouth at bedtime.  Dispense: 180 tablet; Refill: 0  2. MDD (major depressive disorder), recurrent episode, moderate ([facility])  - sertraline (ZOLOFT) 100 MG tablet; Take 1 tablet (100 mg total) by mouth daily.  Dispense: 90 tablet; Refill: 0  3. Essential  hypertension  - lisinopril-hydrochlorothiazide (ZESTORETIC) 20-12.5 MG tablet; Take 1 tablet by mouth daily.  Dispense: 90 tablet; Refill: 0  4. Insomnia due to medical condition  - hydrOXYzine (ATARAX/VISTARIL) 25 MG tablet; Take 1 tablet (25 mg total) by mouth 3 (three) times daily as needed for anxiety.  Dispense: 270 tablet; Refill: 0  5. Moderate persistent asthma with allergic rhinitis without complication  - Fluticasone-Salmeterol (ADVAIR) 250-50 MCG/DOSE AEPB; Inhale 1 puff into the lungs every 12 (twelve) hours as needed.  Dispense: 180 each; Refill: 0  I discussed the assessment and treatment plan with the patient. The patient was provided an opportunity to ask questions and all were answered. The patient agreed with the plan and demonstrated an understanding of the instructions.   The patient was advised to call back or seek an in-person evaluation if the symptoms worsen or if the condition fails to improve as anticipated.  I provided 25 minutes of non-face-to-face time during this encounter.  Loistine Chance, MD

## 2019-10-19 DIAGNOSIS — I10 Essential (primary) hypertension: Secondary | ICD-10-CM | POA: Diagnosis not present

## 2019-10-19 DIAGNOSIS — Z720 Tobacco use: Secondary | ICD-10-CM | POA: Diagnosis not present

## 2019-10-19 DIAGNOSIS — E785 Hyperlipidemia, unspecified: Secondary | ICD-10-CM | POA: Diagnosis not present

## 2019-10-19 NOTE — Addendum Note (Signed)
Addended by: Inda Coke on: 10/19/2019 04:20 PM   Modules accepted: Orders

## 2019-10-19 NOTE — Telephone Encounter (Signed)
Patient called to check the status of her refill.  CB# [phone]

## 2019-10-21 MED ORDER — MELOXICAM 15 MG PO TABS: 15.0000 mg | ORAL_TABLET | Freq: Every day | ORAL | 0 refills | Status: DC

## 2019-10-21 MED ORDER — TRIAMCINOLONE ACETONIDE 0.1 % EX CREA: 1.0000 "application " | TOPICAL_CREAM | Freq: Two times a day (BID) | CUTANEOUS | 0 refills | Status: AC

## 2019-10-21 MED ORDER — ALBUTEROL SULFATE HFA 108 (90 BASE) MCG/ACT IN AERS: 2.0000 | INHALATION_SPRAY | RESPIRATORY_TRACT | 0 refills | Status: AC | PRN

## 2019-10-21 MED ORDER — SERTRALINE HCL 100 MG PO TABS: 100.0000 mg | ORAL_TABLET | Freq: Every day | ORAL | 0 refills | Status: DC

## 2019-10-21 MED ORDER — TIZANIDINE HCL 4 MG PO TABS: 4.0000 mg | ORAL_TABLET | Freq: Three times a day (TID) | ORAL | 0 refills | Status: AC | PRN

## 2019-10-21 MED ORDER — ROSUVASTATIN CALCIUM 20 MG PO TABS: 20.0000 mg | ORAL_TABLET | Freq: Every day | ORAL | 0 refills | Status: DC

## 2019-10-21 MED ORDER — MONTELUKAST SODIUM 10 MG PO TABS: 10.0000 mg | ORAL_TABLET | Freq: Every day | ORAL | 0 refills | Status: AC

## 2019-10-22 ENCOUNTER — Telehealth: Payer: Self-pay

## 2019-10-22 NOTE — Telephone Encounter (Signed)
Called and spoke with the patient regarding her message below.  She stated that she was informed the swelling will go down, but she feels the swelling is going up instead of going down.  And she don't understand what's happening.    She said that [place] told her if she has any questions she could give her a call.  I informed the patient that [name] is not in the office right now she's at the hospital helping with surgery and also making rounds.  I also let the patient know that I will let [name] know she called and ask her to give her a call back.  Patient verbalized understanding and agreed.//AB/CMA

## 2019-10-22 NOTE — Telephone Encounter (Signed)
Patient called complaining of pain on the left side of her abdomen around the incision site. There is also swelling that has not decreased. She also notes unusual drainage and would like to speak to [place], PA-C to discuss further.

## 2019-10-22 NOTE — Telephone Encounter (Signed)
Called patient and left message on her voicemail  Some swelling at this time is expected. She should make sure her compression garment is covering this area to help prevent additional swelling.

## 2019-10-29 ENCOUNTER — Telehealth: Payer: Self-pay | Admitting: Plastic Surgery

## 2019-10-29 NOTE — Telephone Encounter (Signed)
Called patient and left message on their voicemail. The burning sensation she describes is consistent with the nerve pain she was feeling previously.  She can take the Gabapentin previous prescribed by Dr. Claudia Desanctis. I'm also happy to call in celebrex for her is she likes.  For the swelling she needs to make sure her compression garment is covering the incision as well as the area above and below the incision to help compress the space to reduce the swelling.  She may also use ice and ibuprofen to help reduce the swelling.  If she is still concerned I'm more than happy to see her in the office.   Patient was called back after her previous message last week. I left a message on her voicemail at 4:40 pm on 5/3.

## 2019-10-29 NOTE — Telephone Encounter (Signed)
Called patient and got her voicemail.  Left message repeating information in earlier message.

## 2019-10-29 NOTE — Telephone Encounter (Signed)
Patient called to say she didn't receive a call back from the message she left last week. She is having a stinging/burning sensation where the stitches were and the swelling is at the top of her stomach. Please call her back to advise.

## 2019-11-03 ENCOUNTER — Other Ambulatory Visit: Payer: Self-pay | Admitting: Family Medicine

## 2019-11-03 DIAGNOSIS — E7800 Pure hypercholesterolemia, unspecified: Secondary | ICD-10-CM

## 2019-11-03 NOTE — Telephone Encounter (Signed)
Change of pharmacy Requested Prescriptions  Pending Prescriptions Disp Refills  . rosuvastatin (CRESTOR) 20 MG tablet [Pharmacy Med Name: ROSUVASTATIN CALCIUM 20 MG TAB] 90 tablet 0    Sig: TAKE 1 TABLET BY MOUTH EVERY DAY     Cardiovascular:  Antilipid - Statins Passed - 11/03/2019 10:08 AM      Passed - Total Cholesterol in normal range and within 360 days    Cholesterol, Total  Date Value Ref Range Status  01/22/2015 219 (H) 100 - 199 mg/dL Final   Cholesterol  Date Value Ref Range Status  03/06/2019 156 <200 mg/dL Final         Passed - LDL in normal range and within 360 days    LDL Cholesterol (Calc)  Date Value Ref Range Status  03/06/2019 82 mg/dL (calc) Final    Comment:    Reference range: <100 . Desirable range <100 mg/dL for primary prevention;   <70 mg/dL for patients with CHD or diabetic patients  with > or = 2 CHD risk factors. Marland Kitchen LDL-C is now calculated using the Martin-Hopkins  calculation, which is a validated novel method providing  better accuracy than the Friedewald equation in the  estimation of LDL-C.  Cresenciano Genre et al. Annamaria Helling. [id number]): [id number]  (http://education.[facility].com/faq/FAQ164)          Passed - HDL in normal range and within 360 days    HDL  Date Value Ref Range Status  03/06/2019 51 > OR = 50 mg/dL Final  01/22/2015 41 >39 mg/dL Final    Comment:    According to ATP-III Guidelines, HDL-C >59 mg/dL is considered a negative risk factor for CHD.          Passed - Triglycerides in normal range and within 360 days    Triglycerides  Date Value Ref Range Status  03/06/2019 131 <150 mg/dL Final         Passed - Patient is not pregnant      Passed - Valid encounter within last 12 months    Recent Outpatient Visits          2 weeks ago PTSD (post-traumatic stress disorder)   [facility] Steele Sizer, MD   8 months ago MDD (major depressive disorder), recurrent episode, moderate [facility])   [facility] Steele Sizer, MD   11 months ago Essential hypertension   [facility] Steele Sizer, MD   1 year ago Essential hypertension   [facility] Steele Sizer, MD   1 year ago MDD (major depressive disorder), recurrent episode, moderate [facility])   [facility] Steele Sizer, MD      Future Appointments            In 1 month Ancil Boozer, Drue Stager, MD [facility]

## 2019-11-04 ENCOUNTER — Other Ambulatory Visit: Payer: Self-pay | Admitting: Family Medicine

## 2019-11-04 DIAGNOSIS — M542 Cervicalgia: Secondary | ICD-10-CM

## 2019-11-04 DIAGNOSIS — G8929 Other chronic pain: Secondary | ICD-10-CM

## 2019-11-04 NOTE — Telephone Encounter (Signed)
Requested medication (s) are due for refill today: no  Requested medication (s) are on the active medication list: yes  Last refill:  10/21/19  Future visit scheduled: yes  Notes to clinic:  med not delegated to NT to Refill   Requested Prescriptions  Pending Prescriptions Disp Refills   tiZANidine (ZANAFLEX) 4 MG tablet [Pharmacy Med Name: TIZANIDINE HCL 4 MG TABLET] 270 tablet 1    Sig: TAKE 1 TABLET BY MOUTH EVERY 8 HOURS AS NEEDED      Not Delegated - Cardiovascular:  Alpha-2 Agonists - tizanidine Failed - 11/04/2019 11:52 AM      Failed - This refill cannot be delegated      Passed - Valid encounter within last 6 months    Recent Outpatient Visits           2 weeks ago PTSD (post-traumatic stress disorder)   [facility] Steele Sizer, MD   8 months ago MDD (major depressive disorder), recurrent episode, moderate [facility])   [facility] Steele Sizer, MD   11 months ago Essential hypertension   [facility] Steele Sizer, MD   1 year ago Essential hypertension   [facility] Steele Sizer, MD   1 year ago MDD (major depressive disorder), recurrent episode, moderate [facility])   [facility] Steele Sizer, MD       Future Appointments             In 1 month Ancil Boozer, Drue Stager, MD [facility]

## 2019-11-07 ENCOUNTER — Telehealth: Payer: Self-pay | Admitting: Plastic Surgery

## 2019-11-07 NOTE — Telephone Encounter (Signed)
Spoke with patient.  Pain she has is an occasional sharp pain on the left side. Patient would like to hold off on trying the Celebrex and will instead continue with Tylenol and Ibuprofen. With regards to the blood, she reports its just a small amount that she sees on the gauze, no blood visible on her body. She reports she feels she scratched herself in her sleep and that is the cause.  Recommend she call us if this continues or increases.  With regards to feeling like her food is settling at the top and not moving down... Patient describes that when she eats soft foods she does not have this issue.  When she eats foods she has to chew more is when she has this feeling.  Reports she has dental issues that prevent her from chewing well.  Recommend she try cutting food into smaller pieces and eating smaller meals (more frequently if needed) to see if it helps these symptoms. Recommend she see a dentist and possibly a GI doctor if these symptoms continue.

## 2019-11-07 NOTE — Telephone Encounter (Signed)
Patient called back to advise she hadn't heard from [place] from previous messages. Advd patient of dates/times [name] tried to call her back. Patient said we had an old number and we should be calling [phone]. Updated demographics to show correct telephone number. Read previous note from [place] to patient. She would like to try celebrex. [facility] [address] in [place].   Also, the swelling she was referring to was that it seems to be as if her food is settling at the top and she would like to know how to get the food to move down. She can't exercise because of this. There is also blood draining where one of the stitches were. It's not a lot but it's noticeable.   Please call at the number listed above to advise.

## 2019-11-20 NOTE — Progress Notes (Signed)
Patient is a 64 year old female here for follow-up after undergoing an infraumbilical panniculectomy on 10/02/2019 with Dr. Claudia Desanctis.  At her last visit on 4/29 her incisions were healing very well.  Patient reported drain output of 30 cc and therefore drain was removed.  Her pain was well controlled with ibuprofen and Tylenol.  ~ 7 weeks PO. Today Janice Wilson reports she is doing very well.  Incision are healing very nicely, C/D/I.  No signs of infection, redness, drainage, seroma/hematoma.  Denies fever.  Reports some fullness and bloating after heavy meals that appears to resolve with use of a stool softener.  We discussed eating smaller healthier meals and if symptoms continue she will follow up with GI.  She does not have to wear compression garments at night anymore.  She should still try to continue to wear them during the day as much as possible until 3 months postop.  She may apply Vaseline, cocoa butter, or other unscented lotions for moisture on the skin around her incisions.  She may resume normal activity gradually as tolerated.  She should wait 2 more weeks until taking baths or swimming in a pool.  Follow-up as needed.  Call office with any questions/concerns.          Pictures were obtained of the patient and placed in the chart with the patient's or guardian's permission.  The [place] was signed into law in 2016 which includes the topic of electronic health records.  This provides immediate access to information in MyChart.  This includes consultation notes, operative notes, office notes, lab results and pathology reports.  If you have any questions about what you read please let us know at your next visit or call us at the office.  We are right here with you.

## 2019-11-21 ENCOUNTER — Encounter: Payer: Self-pay | Admitting: Plastic Surgery

## 2019-11-21 ENCOUNTER — Other Ambulatory Visit: Payer: Self-pay

## 2019-11-21 ENCOUNTER — Ambulatory Visit (INDEPENDENT_AMBULATORY_CARE_PROVIDER_SITE_OTHER): Payer: Medicare Other | Admitting: Plastic Surgery

## 2019-11-21 VITALS — BP 172/90 | HR 65 | Temp 97.1°F | Ht 64.0 in | Wt 167.0 lb

## 2019-11-21 DIAGNOSIS — Z9889 Other specified postprocedural states: Secondary | ICD-10-CM

## 2019-11-27 ENCOUNTER — Other Ambulatory Visit: Payer: Self-pay | Admitting: Family Medicine

## 2019-11-27 NOTE — Telephone Encounter (Signed)
Requested Prescriptions  Pending Prescriptions Disp Refills  . meloxicam (MOBIC) 15 MG tablet [Pharmacy Med Name: MELOXICAM 15 MG TABLET] 90 tablet 0    Sig: TAKE 1 TABLET BY MOUTH DAILY     Analgesics:  COX2 Inhibitors Passed - 11/27/2019  1:30 AM      Passed - HGB in normal range and within 360 days    Hemoglobin  Date Value Ref Range Status  03/06/2019 13.7 11.7 - 15.5 g/dL Final  01/29/2015 13.8 11.1 - 15.9 g/dL Final         Passed - Cr in normal range and within 360 days    Creat  Date Value Ref Range Status  03/06/2019 0.81 0.50 - 0.99 mg/dL Final    Comment:    For patients >18 years of age, the reference limit for Creatinine is approximately 13% higher for people identified as African-American. Renella Cunas - Patient is not pregnant      Passed - Valid encounter within last 12 months    Recent Outpatient Visits          1 month ago PTSD (post-traumatic stress disorder)   [facility] Steele Sizer, MD   8 months ago MDD (major depressive disorder), recurrent episode, moderate [facility])   [facility] Steele Sizer, MD   1 year ago Essential hypertension   [facility] Sizer, MD   1 year ago Essential hypertension   [facility] Steele Sizer, MD   1 year ago MDD (major depressive disorder), recurrent episode, moderate [facility])   [facility] Steele Sizer, MD      Future Appointments            In 3 weeks Steele Sizer, MD [place] sent to pharmacy in [place], [place].  [facility] [place] [place].

## 2019-12-13 ENCOUNTER — Telehealth: Payer: Self-pay

## 2019-12-13 NOTE — Telephone Encounter (Signed)
Patient called stating she has noticed a bump at her incision site 2 days ago which is red and sore. She is concerned for infection and would like a call back.

## 2019-12-14 MED ORDER — SULFAMETHOXAZOLE-TRIMETHOPRIM 400-80 MG PO TABS: 2.0000 | ORAL_TABLET | Freq: Two times a day (BID) | ORAL | 0 refills | Status: AC

## 2019-12-14 NOTE — Telephone Encounter (Signed)
Spoke with patient. Sent Abx to pharmacy for her. She's to keep area clean and dry.

## 2019-12-19 ENCOUNTER — Other Ambulatory Visit: Payer: Self-pay | Admitting: Family Medicine

## 2019-12-19 DIAGNOSIS — F4310 Post-traumatic stress disorder, unspecified: Secondary | ICD-10-CM

## 2019-12-19 DIAGNOSIS — F331 Major depressive disorder, recurrent, moderate: Secondary | ICD-10-CM

## 2019-12-19 DIAGNOSIS — J4540 Moderate persistent asthma, uncomplicated: Secondary | ICD-10-CM

## 2019-12-19 DIAGNOSIS — I10 Essential (primary) hypertension: Secondary | ICD-10-CM

## 2019-12-19 NOTE — Telephone Encounter (Signed)
Requested Prescriptions  Pending Prescriptions Disp Refills  . sertraline (ZOLOFT) 100 MG tablet [Pharmacy Med Name: SERTRALINE HCL 100MG  TABLET] 90 tablet 1    Sig: TAKE 1 TABLET BY MOUTH  DAILY     Psychiatry:  Antidepressants - SSRI Passed - 12/19/2019  6:36 AM      Passed - Completed PHQ-2 or PHQ-9 in the last 360 days.      Passed - Valid encounter within last 6 months    Recent Outpatient Visits          2 months ago PTSD (post-traumatic stress disorder)   [facility] Steele Sizer, MD   9 months ago MDD (major depressive disorder), recurrent episode, moderate ([place])   [facility], Drue Stager, MD   1 year ago Essential hypertension   [facility], Drue Stager, MD   1 year ago Essential hypertension   [facility] Steele Sizer, MD   1 year ago MDD (major depressive disorder), recurrent episode, moderate ([place])   [facility] [place], Drue Stager, MD             . lisinopril-hydrochlorothiazide (ZESTORETIC) 20-12.5 MG tablet [Pharmacy Med Name: LISINOPRIL/HCTZ 20-12.5MG  TABLET] 90 tablet 3    Sig: TAKE 1 TABLET BY MOUTH  DAILY     Cardiovascular:  ACEI + Diuretic Combos Failed - 12/19/2019  6:36 AM      Failed - Na in normal range and within 180 days    Sodium  Date Value Ref Range Status  03/06/2019 139 135 - 146 mmol/L Final  10/08/2013 138 136 - 145 mmol/L Final         Failed - K in normal range and within 180 days    Potassium  Date Value Ref Range Status  03/06/2019 3.8 3.5 - 5.3 mmol/L Final  10/08/2013 3.7 3.5 - 5.1 mmol/L Final         Failed - Cr in normal range and within 180 days    Creat  Date Value Ref Range Status  03/06/2019 0.81 0.50 - 0.99 mg/dL Final    Comment:    For patients >17 years of age, the reference limit for Creatinine is approximately 13% higher for people identified as African-American. .          Failed - Ca in normal range  and within 180 days    Calcium  Date Value Ref Range Status  03/06/2019 9.3 8.6 - 10.4 mg/dL Final   Calcium, Total  Date Value Ref Range Status  10/08/2013 9.0 8.5 - 10.1 mg/dL Final         Failed - Last BP in normal range    BP Readings from Last 1 Encounters:  11/21/19 (!) 172/90         Passed - Patient is not pregnant      Passed - Valid encounter within last 6 months    Recent Outpatient Visits          2 months ago PTSD (post-traumatic stress disorder)   [facility], Drue Stager, MD   9 months ago MDD (major depressive disorder), recurrent episode, moderate [facility])   [facility] Steele Sizer, MD   1 year ago Essential hypertension   [facility]izer, MD   1 year ago Essential hypertension   [facility] [place], Drue Stager, MD   1 year ago MDD (major depressive disorder), recurrent episode, moderate ([place])  [facility], Drue Stager, MD             . [place] Ruts INHUB 250-50 MCG/DOSE AEPB [Pharmacy Med Name: [place]  INHUB] 180 each 3    Sig: USE 1 INHALATION BY MOUTH  EVERY 12 HOURS AS NEEDED     Pulmonology:  Combination Products Passed - 12/19/2019  6:36 AM      Passed - Valid encounter within last 12 months    Recent Outpatient Visits          2 months ago PTSD (post-traumatic stress disorder)   [facility] Steele Sizer, MD   9 months ago MDD (major depressive disorder), recurrent episode, moderate ([place])   [facility] [place], Drue Stager, MD   1 year ago Essential hypertension   [facility], Drue Stager, MD   1 year ago Essential hypertension   [facility] Steele Sizer, MD   1 year ago MDD (major depressive disorder), recurrent episode, moderate ([place])   [facility], Drue Stager, MD             . divalproex (DEPAKOTE  ER) 500 MG 24 hr tablet [Pharmacy Med Name: DIVALPROEX 500MG  ER TABLET 24HR] 180 tablet 3    Sig: TAKE 2 TABLETS BY MOUTH AT  BEDTIME     Not Delegated - Neurology:  Anticonvulsants - Valproates Failed - 12/19/2019  6:36 AM      Failed - This refill cannot be delegated      Failed - Valproic Acid (serum) in normal range and within 360 days    Valproic Acid Lvl  Date Value Ref Range Status  08/29/2018 83 50 - 100 ug/mL Final    Comment:                                    Detection Limit = 4                            <4 indicates None Detected Toxicity may occur at levels of 100-500. Measurements of free unbound valproic acid may improve the assess- ment of clinical response.          Passed - AST in normal range and within 360 days    AST  Date Value Ref Range Status  03/06/2019 16 10 - 35 U/L Final         Passed - ALT in normal range and within 360 days    ALT  Date Value Ref Range Status  03/06/2019 18 6 - 29 U/L Final         Passed - HGB in normal range and within 360 days    Hemoglobin  Date Value Ref Range Status  03/06/2019 13.7 11.7 - 15.5 g/dL Final  01/29/2015 13.8 11.1 - 15.9 g/dL Final         Passed - PLT in normal range and within 360 days    Platelets  Date Value Ref Range Status  03/06/2019 223 140 - 400 Thousand/uL Final  01/29/2015 262 150 - 379 x10E3/uL Final         Passed - WBC in normal range and within 360 days    WBC  Date Value Ref Range Status  03/06/2019 9.8 3.8 - 10.8 Thousand/uL Final  Passed - HCT in normal range and within 360 days    HCT  Date Value Ref Range Status  03/06/2019 40.8 35 - 45 % Final   Hematocrit  Date Value Ref Range Status  01/29/2015 42.1 34.0 - 46.6 % Final         Passed - Valid encounter within last 12 months    Recent Outpatient Visits          2 months ago PTSD (post-traumatic stress disorder)   [facility] [place], Drue Stager, MD   9 months ago MDD (major depressive  disorder), recurrent episode, moderate [facility])   [facility] Steele Sizer, MD   1 year ago Essential hypertension   [facility] Steele Sizer, MD   1 year ago Essential hypertension   [facility] Steele Sizer, MD   1 year ago MDD (major depressive disorder), recurrent episode, moderate [facility])   [facility] Steele Sizer, MD

## 2019-12-19 NOTE — Telephone Encounter (Signed)
Requested medications are due for refill today?  Yes  Requested medications are on active medication list?  Yes  Last Refill:   Lisinopril - Hydrochlorothiazide   10/18/2019  # 90 with no refills   Divalproex (Depakote ER)  10/18/2019  # 180 with no refills.     Future visit scheduled?  No  Notes to Clinic:    Lisinopril - Hydrochlorothiazide failed medication refill protocol due to no labs within past 180 days.  Last labs were performed on 03/06/2019.    Divalproex (Depakote ER) Refill cannot be delegated.

## 2019-12-21 ENCOUNTER — Ambulatory Visit: Payer: Medicare Other | Admitting: Family Medicine

## 2020-03-14 ENCOUNTER — Telehealth: Payer: Self-pay | Admitting: *Deleted

## 2020-03-14 NOTE — Telephone Encounter (Signed)
(  03/14/2020)  Pt informed me that she has moved to [place] and asked for a referral to a preventative Lung Cancer program closer to where she lives. She noted that her new PCP is Dr. Belenda Cruise with [facility].  SRW

## 2020-05-03 ENCOUNTER — Other Ambulatory Visit: Payer: Self-pay | Admitting: Family Medicine

## 2020-05-03 DIAGNOSIS — G8929 Other chronic pain: Secondary | ICD-10-CM

## 2020-05-04 ENCOUNTER — Other Ambulatory Visit: Payer: Self-pay | Admitting: Family Medicine

## 2020-05-04 DIAGNOSIS — E7800 Pure hypercholesterolemia, unspecified: Secondary | ICD-10-CM

## 2020-05-06 NOTE — Telephone Encounter (Signed)
Contacted and discussed notifying her PCP of need for lung screening this year. Patient will contact her pcp.

## 2020-05-21 ENCOUNTER — Other Ambulatory Visit: Payer: Self-pay | Admitting: Plastic Surgery

## 2020-06-24 ENCOUNTER — Other Ambulatory Visit: Payer: Self-pay | Admitting: Family Medicine

## 2020-06-24 DIAGNOSIS — E7800 Pure hypercholesterolemia, unspecified: Secondary | ICD-10-CM

## 2020-06-24 DIAGNOSIS — G8929 Other chronic pain: Secondary | ICD-10-CM

## 2020-06-24 NOTE — Telephone Encounter (Signed)
Requested medication (s) are due for refill today:   Yes  Requested medication (s) are on the active medication list:   Yes  Future visit scheduled:   No   Last ordered: 11/03/2019 #90, 0 refills  Returned because need a DX CODE.   Requested Prescriptions  Pending Prescriptions Disp Refills   rosuvastatin (CRESTOR) 20 MG tablet [Pharmacy Med Name: ROSUVASTATIN CALCIUM 20 MG TAB] 90 tablet 0    Sig: TAKE 1 TABLET BY MOUTH EVERY DAY      Cardiovascular:  Antilipid - Statins Failed - 06/24/2020  2:22 PM      Failed - Total Cholesterol in normal range and within 360 days    Cholesterol, Total  Date Value Ref Range Status  01/22/2015 219 (H) 100 - 199 mg/dL Final   Cholesterol  Date Value Ref Range Status  03/06/2019 156 <200 mg/dL Final          Failed - LDL in normal range and within 360 days    LDL Cholesterol (Calc)  Date Value Ref Range Status  03/06/2019 82 mg/dL (calc) Final    Comment:    Reference range: <100 . Desirable range <100 mg/dL for primary prevention;   <70 mg/dL for patients with CHD or diabetic patients  with > or = 2 CHD risk factors. Marland Kitchen LDL-C is now calculated using the Martin-Hopkins  calculation, which is a validated novel method providing  better accuracy than the Friedewald equation in the  estimation of LDL-C.  Horald Pollen et al. Lenox Ahr. 7026;378(58): 2061-2068  (http://education.[employer].com/faq/FAQ164)           Failed - HDL in normal range and within 360 days    HDL  Date Value Ref Range Status  03/06/2019 51 > OR = 50 mg/dL Final  85/07/7739 41 >28 mg/dL Final    Comment:    According to ATP-III Guidelines, HDL-C >59 mg/dL is considered a negative risk factor for CHD.           Failed - Triglycerides in normal range and within 360 days    Triglycerides  Date Value Ref Range Status  03/06/2019 131 <150 mg/dL Final          Passed - Patient is not pregnant      Passed - Valid encounter within last 12 months    Recent  Outpatient Visits           8 months ago PTSD (post-traumatic stress disorder)   [facility] Alba Cory, MD   1 year ago MDD (major depressive disorder), recurrent episode, moderate [facility])   [facility] Alba Cory, MD   1 year ago Essential hypertension   [facility] Alba Cory, MD   1 year ago Essential hypertension   [facility] Alba Cory, MD   1 year ago MDD (major depressive disorder), recurrent episode, moderate [facility])   [facility] Alba Cory, MD

## 2020-06-24 NOTE — Telephone Encounter (Signed)
Not our pt any longer

## 2020-08-13 ENCOUNTER — Other Ambulatory Visit: Payer: Self-pay | Admitting: Family Medicine

## 2020-08-13 DIAGNOSIS — G8929 Other chronic pain: Secondary | ICD-10-CM

## 2020-10-29 ENCOUNTER — Other Ambulatory Visit: Payer: Self-pay | Admitting: Family Medicine

## 2020-10-29 NOTE — Telephone Encounter (Signed)
Last seen: 4.29.2021 by Dr Ancil Boozer

## 2020-10-29 NOTE — Telephone Encounter (Signed)
Requested medications are due for refill today.  Unknown  Requested medications are on the active medications list.  yes  Last refill. 10/21/2019  Future visit scheduled.   no  Notes to clinic.  Pt more than 3 months overdue for appointment.
